# Patient Record
Sex: Female | Born: 1950 | Race: White | Hispanic: No | Smoking: Never smoker
Health system: Southern US, Community
[De-identification: ages and names within clinical notes are randomized; demographics above are authoritative.]

## PROBLEM LIST (undated history)

## (undated) DIAGNOSIS — F32A Depression, unspecified: Secondary | ICD-10-CM

## (undated) DIAGNOSIS — F329 Major depressive disorder, single episode, unspecified: Secondary | ICD-10-CM

## (undated) DIAGNOSIS — K209 Esophagitis, unspecified without bleeding: Secondary | ICD-10-CM

## (undated) HISTORY — PX: TUBAL LIGATION: SHX77

---

## 1971-12-06 HISTORY — PX: OVARIAN CYST REMOVAL: SHX89

## 2000-03-30 ENCOUNTER — Other Ambulatory Visit: Admission: RE | Admit: 2000-03-30 | Discharge: 2000-03-30 | Payer: Self-pay | Admitting: Obstetrics and Gynecology

## 2001-04-13 ENCOUNTER — Other Ambulatory Visit: Admission: RE | Admit: 2001-04-13 | Discharge: 2001-04-13 | Payer: Self-pay | Admitting: Obstetrics and Gynecology

## 2001-05-10 ENCOUNTER — Encounter: Payer: Self-pay | Admitting: Obstetrics and Gynecology

## 2001-05-10 ENCOUNTER — Encounter: Admission: RE | Admit: 2001-05-10 | Discharge: 2001-05-10 | Payer: Self-pay | Admitting: Obstetrics and Gynecology

## 2001-05-16 ENCOUNTER — Encounter: Admission: RE | Admit: 2001-05-16 | Discharge: 2001-05-16 | Payer: Self-pay | Admitting: Obstetrics and Gynecology

## 2001-05-16 ENCOUNTER — Encounter: Payer: Self-pay | Admitting: Obstetrics and Gynecology

## 2001-08-08 ENCOUNTER — Encounter: Payer: Self-pay | Admitting: Internal Medicine

## 2001-08-08 ENCOUNTER — Encounter: Admission: RE | Admit: 2001-08-08 | Discharge: 2001-08-08 | Payer: Self-pay | Admitting: Internal Medicine

## 2002-01-27 ENCOUNTER — Ambulatory Visit (HOSPITAL_COMMUNITY): Admission: RE | Admit: 2002-01-27 | Discharge: 2002-01-27 | Payer: Self-pay | Admitting: Orthopedic Surgery

## 2002-01-27 ENCOUNTER — Encounter: Payer: Self-pay | Admitting: Orthopedic Surgery

## 2002-04-04 ENCOUNTER — Ambulatory Visit (HOSPITAL_COMMUNITY): Admission: RE | Admit: 2002-04-04 | Discharge: 2002-04-04 | Payer: Self-pay | Admitting: Obstetrics and Gynecology

## 2002-04-04 ENCOUNTER — Encounter (INDEPENDENT_AMBULATORY_CARE_PROVIDER_SITE_OTHER): Payer: Self-pay | Admitting: Specialist

## 2002-05-16 ENCOUNTER — Encounter: Payer: Self-pay | Admitting: Obstetrics and Gynecology

## 2002-05-16 ENCOUNTER — Encounter: Admission: RE | Admit: 2002-05-16 | Discharge: 2002-05-16 | Payer: Self-pay | Admitting: Obstetrics and Gynecology

## 2003-01-09 ENCOUNTER — Other Ambulatory Visit: Admission: RE | Admit: 2003-01-09 | Discharge: 2003-01-09 | Payer: Self-pay | Admitting: Internal Medicine

## 2003-06-13 ENCOUNTER — Encounter: Admission: RE | Admit: 2003-06-13 | Discharge: 2003-06-13 | Payer: Self-pay | Admitting: Obstetrics and Gynecology

## 2003-06-13 ENCOUNTER — Encounter: Payer: Self-pay | Admitting: Obstetrics and Gynecology

## 2004-11-15 ENCOUNTER — Encounter: Admission: RE | Admit: 2004-11-15 | Discharge: 2004-11-15 | Payer: Self-pay | Admitting: Internal Medicine

## 2005-07-15 IMAGING — CR DG CHEST 2V
1 series · 2 of 2 positions shown · non-contrast
Comparison: none

REASON FOR EXAM: Left lateral chest pain
DR.TOTONG [REDACTED]
COMMENTS:

PROCEDURE:     DXR - DXR CHEST PA (OR AP) AND LATERAL  - [DATE]  [DATE]
RESULT:     Views of the chest show the lungs appear to be clear and fully
inflated. The heart and pulmonary vessels are normal. The bony structures
are unremarkable.

[Series 4346: postero_anterior · 0.22mm/px · 2 of 2 slices shown]
[im 1/2]
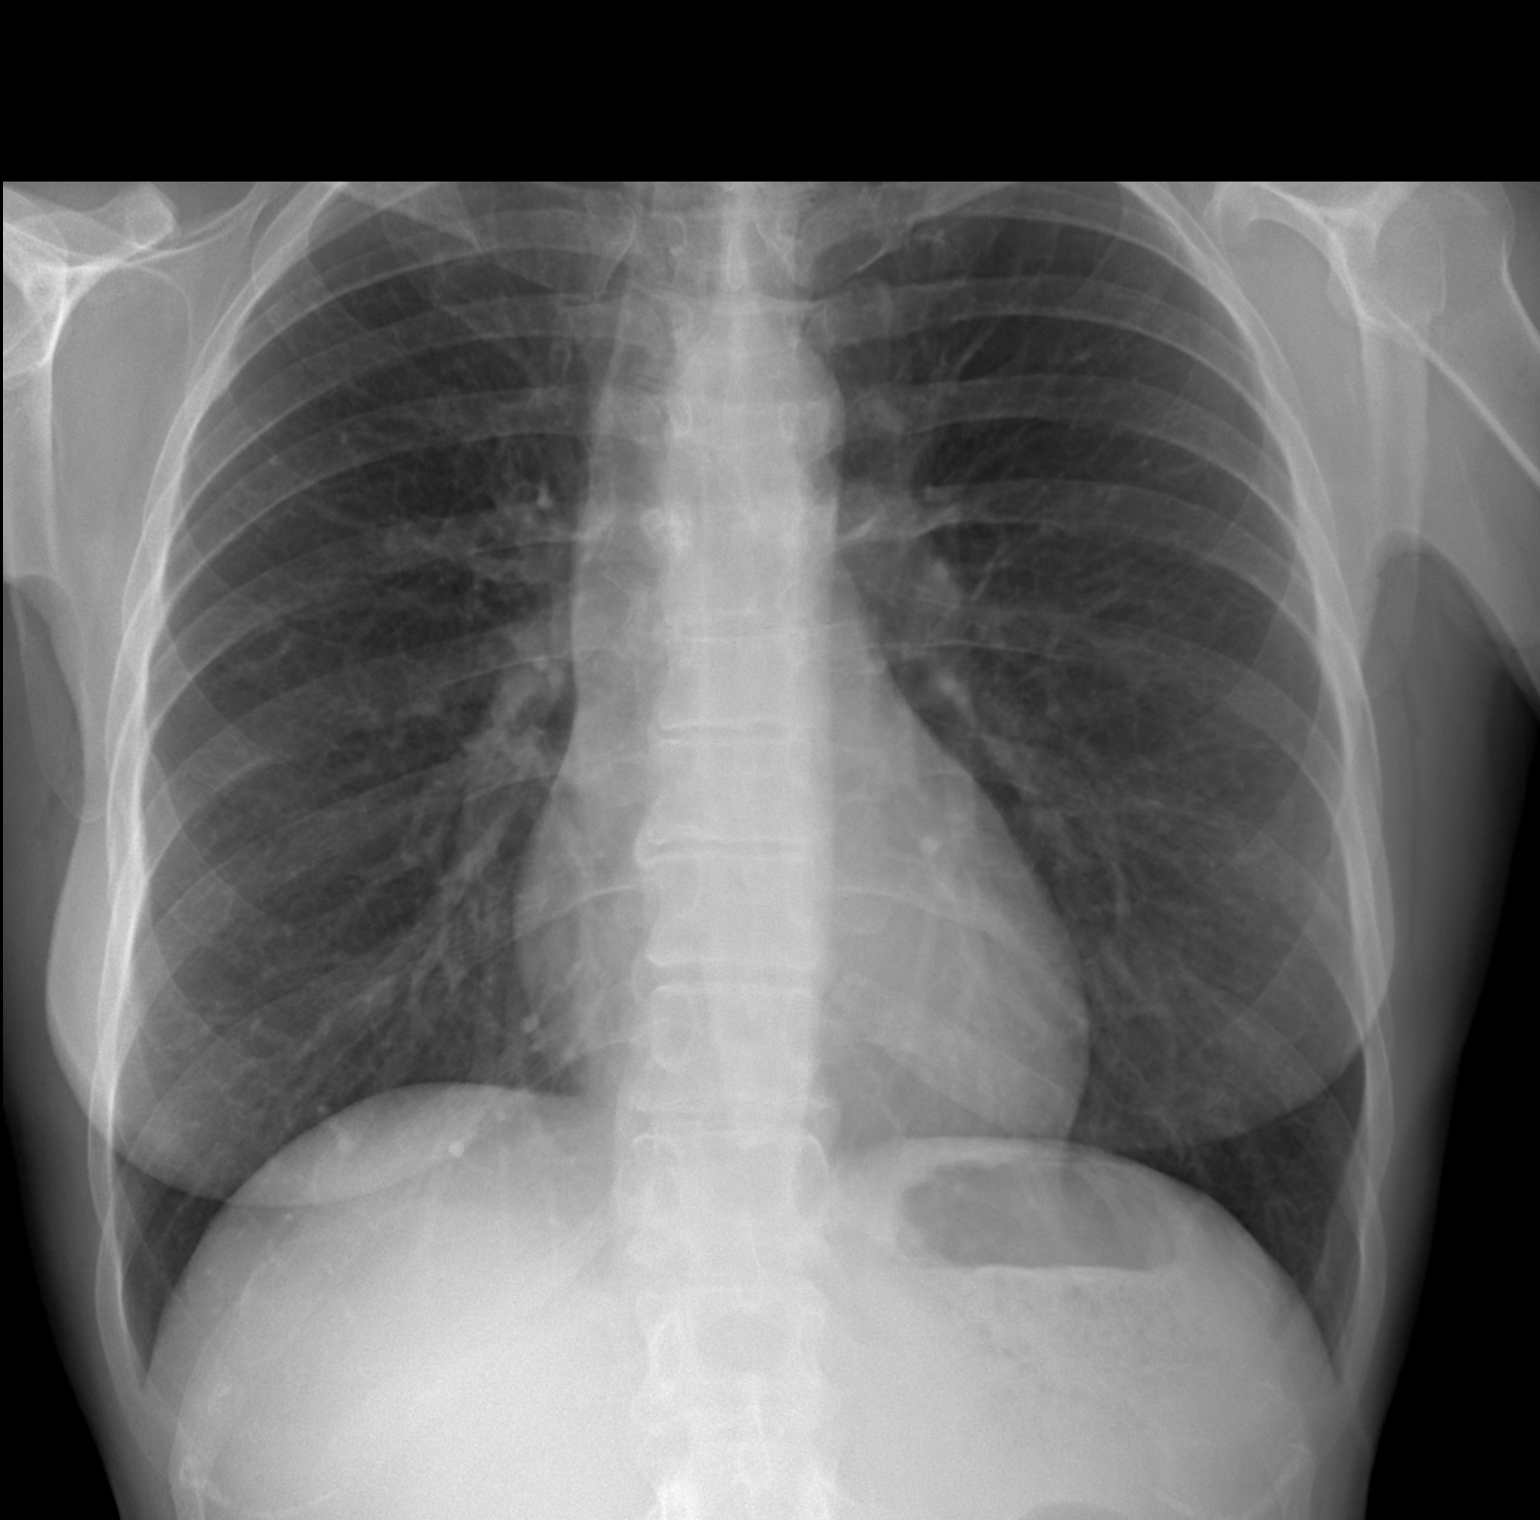
[im 2/2]
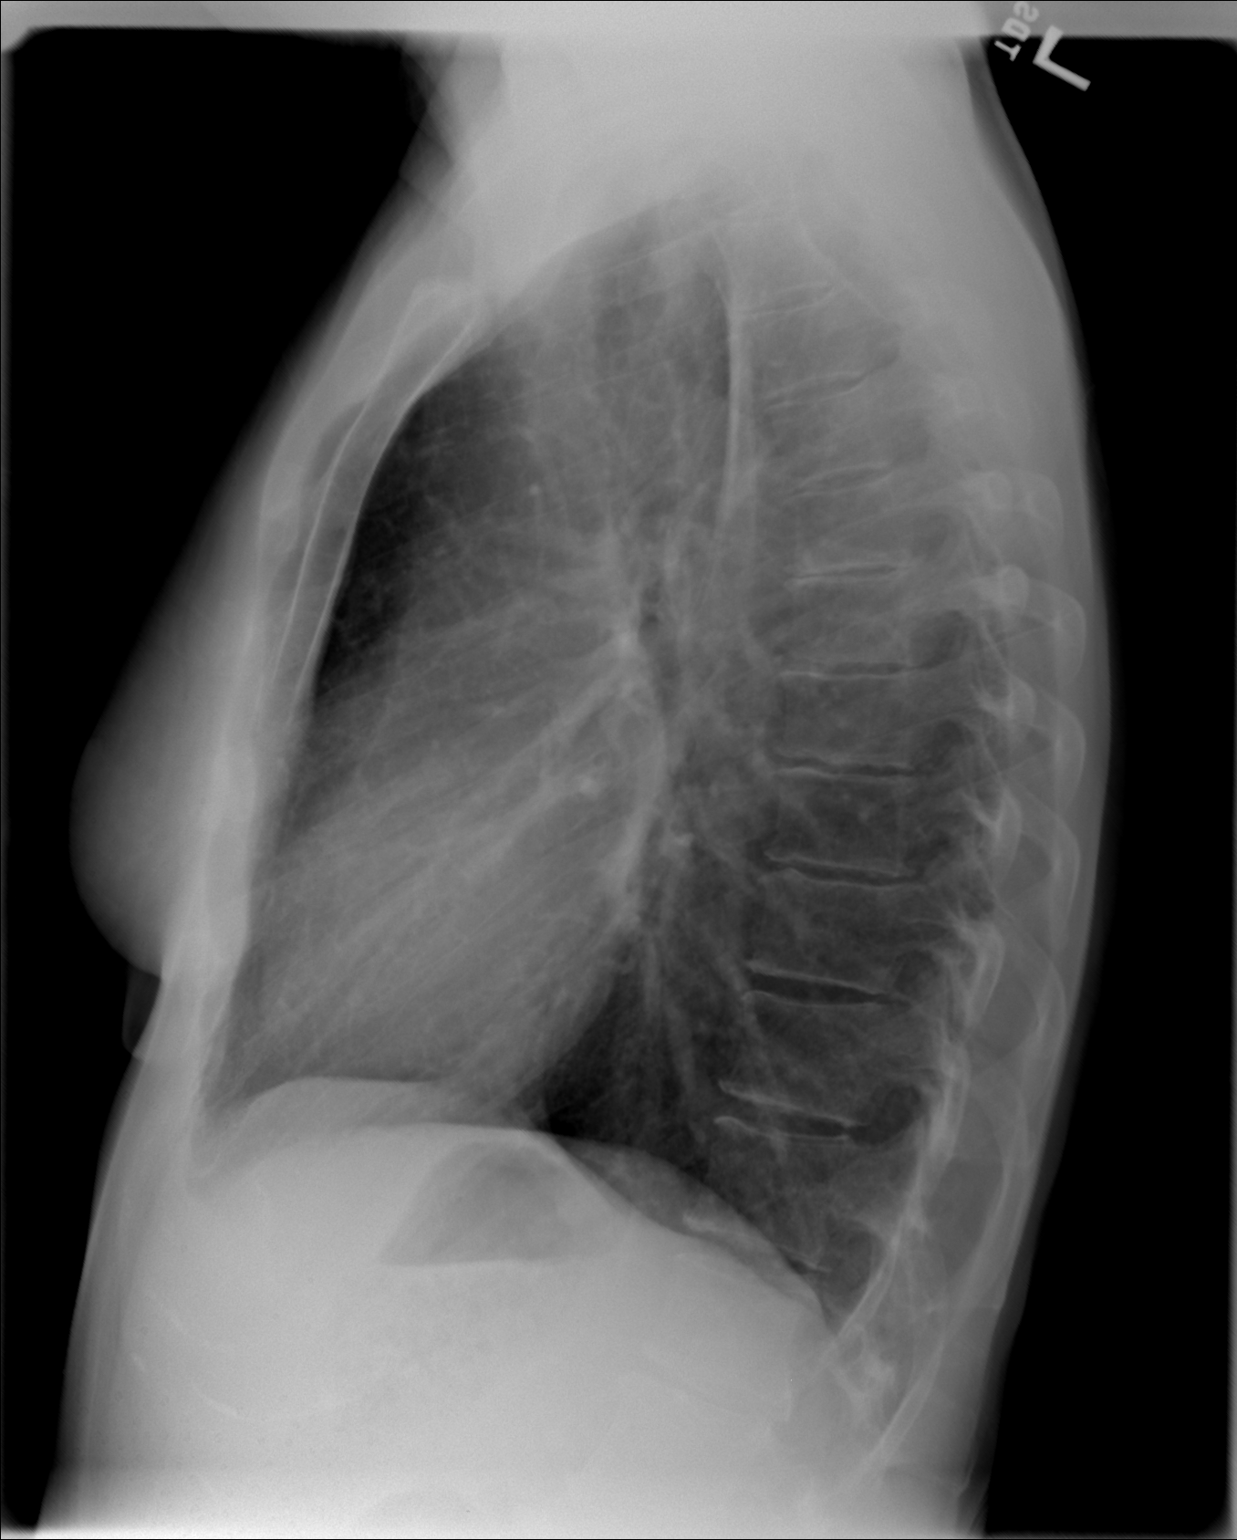

[2 of 2 positions shown; findings below may reference images not displayed]

IMPRESSION: No acute cardiopulmonary disease.

## 2006-02-16 ENCOUNTER — Ambulatory Visit: Payer: Self-pay

## 2006-02-16 IMAGING — CR DG RIBS 2V*L*
1 series · 3 of 3 positions shown · non-contrast
Comparison: none

REASON FOR EXAM: LEFT LATERAL CHEST PAIN(DR.JIM [REDACTED])
COMMENTS:

PROCEDURE:     DXR - DXR RIBS LEFT UNILATERAL  - [DATE]  [DATE]
RESULT:     Multiple views of the LEFT ribs show no definite fracture.

[Series 4347: antero_posterior · 0.22mm/px · 3 of 3 slices shown]
[im 1/3]
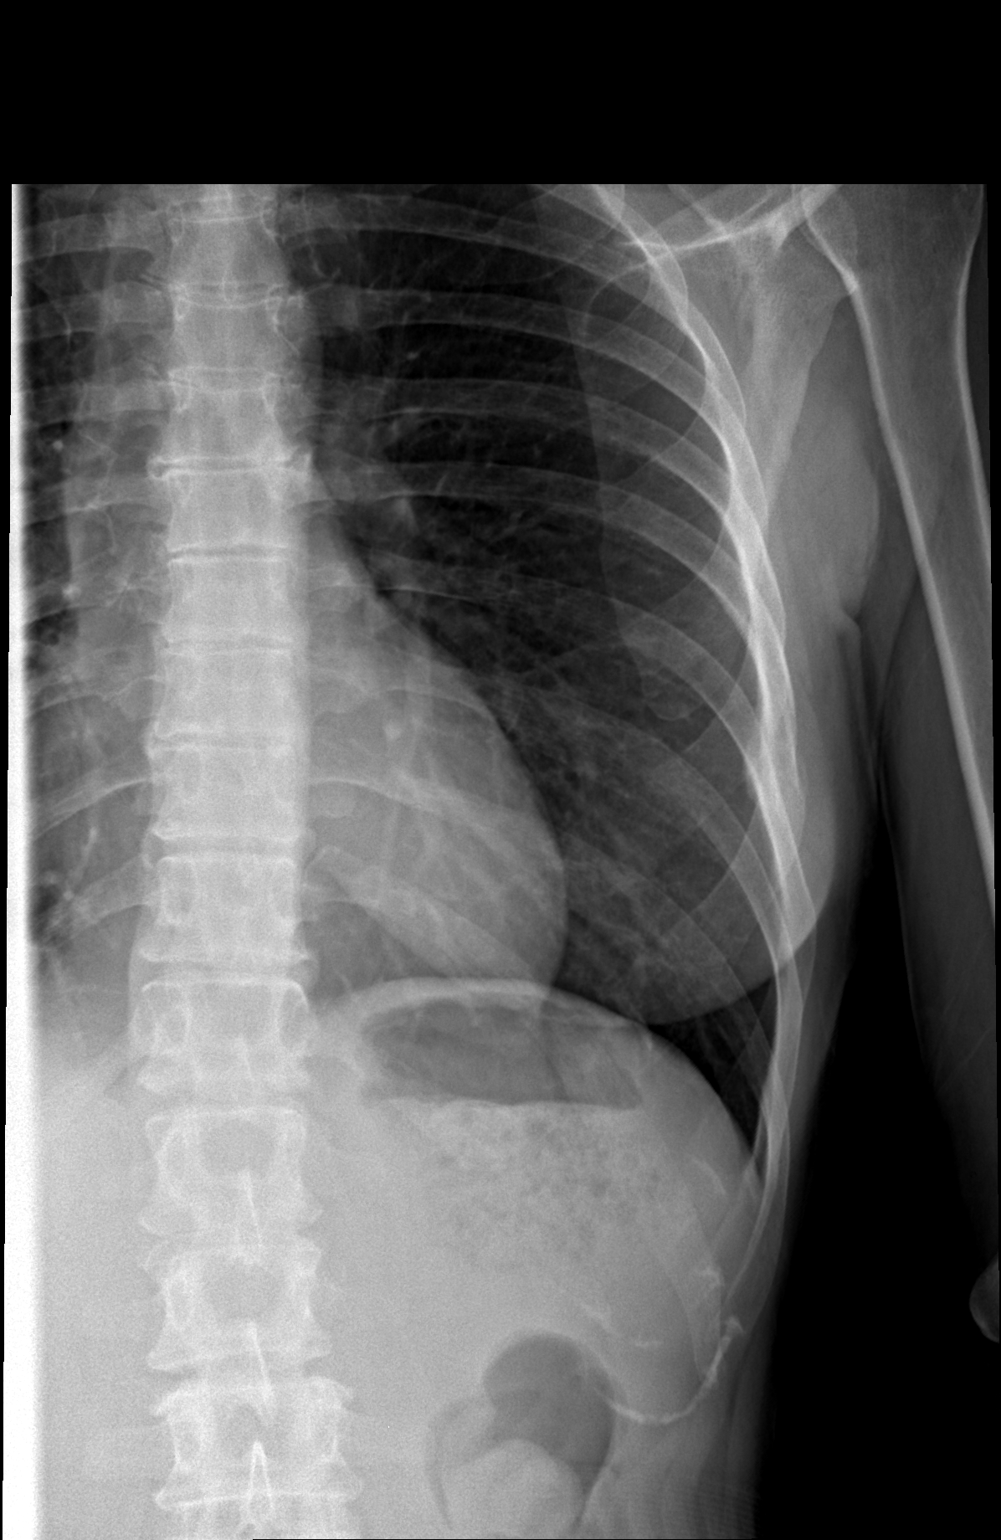
[im 2/3]
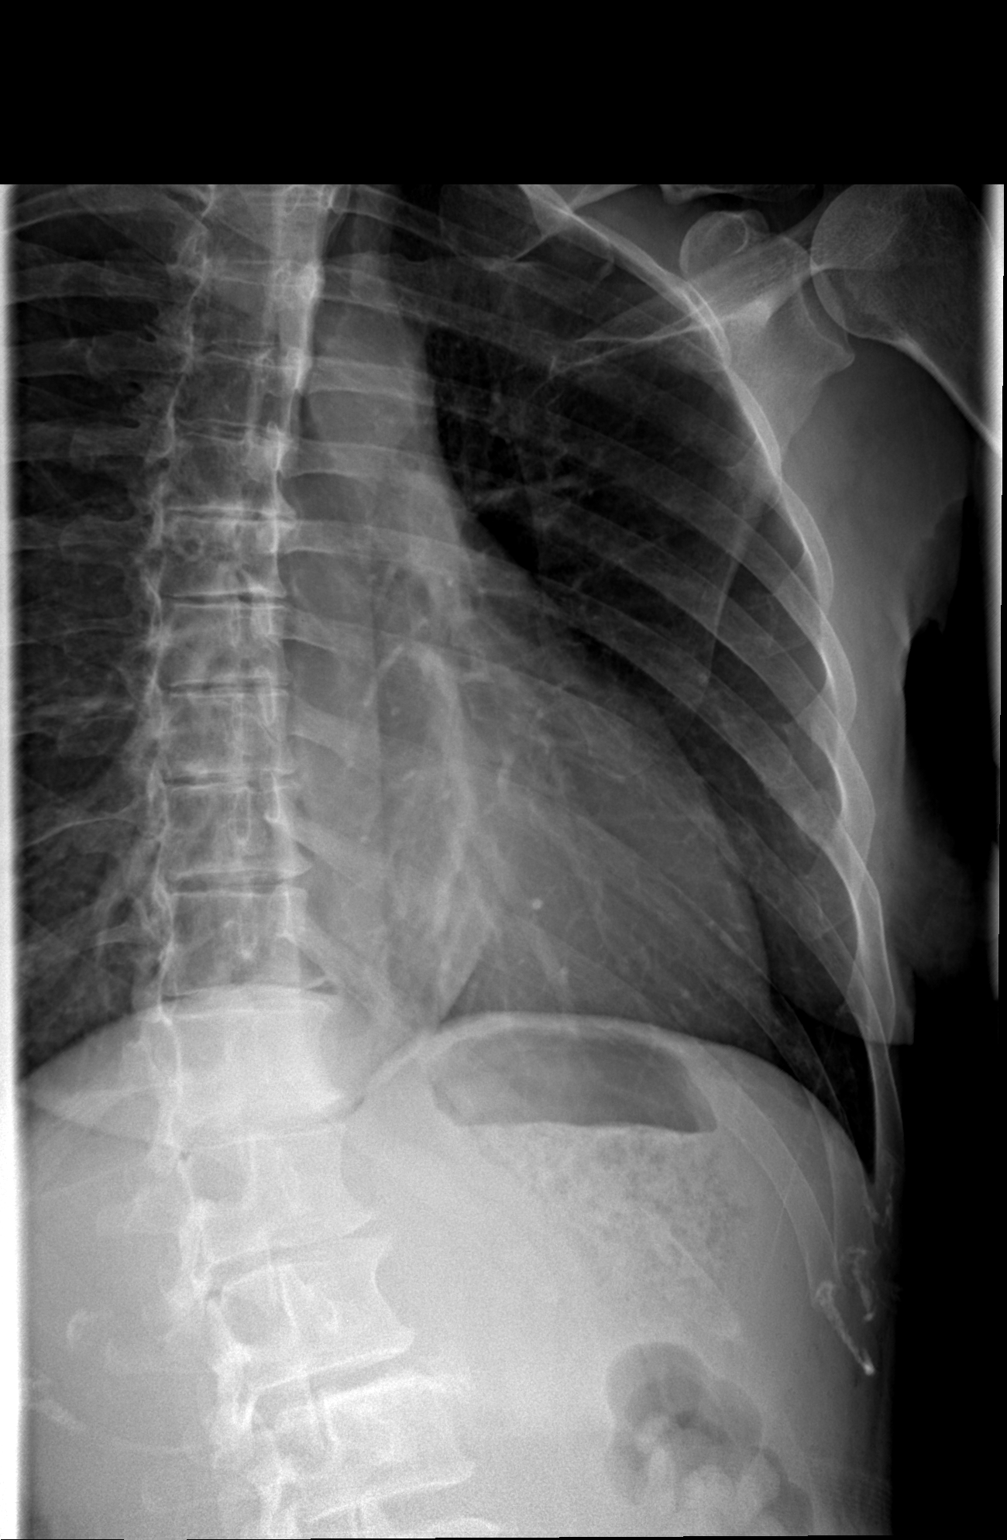
[im 3/3]
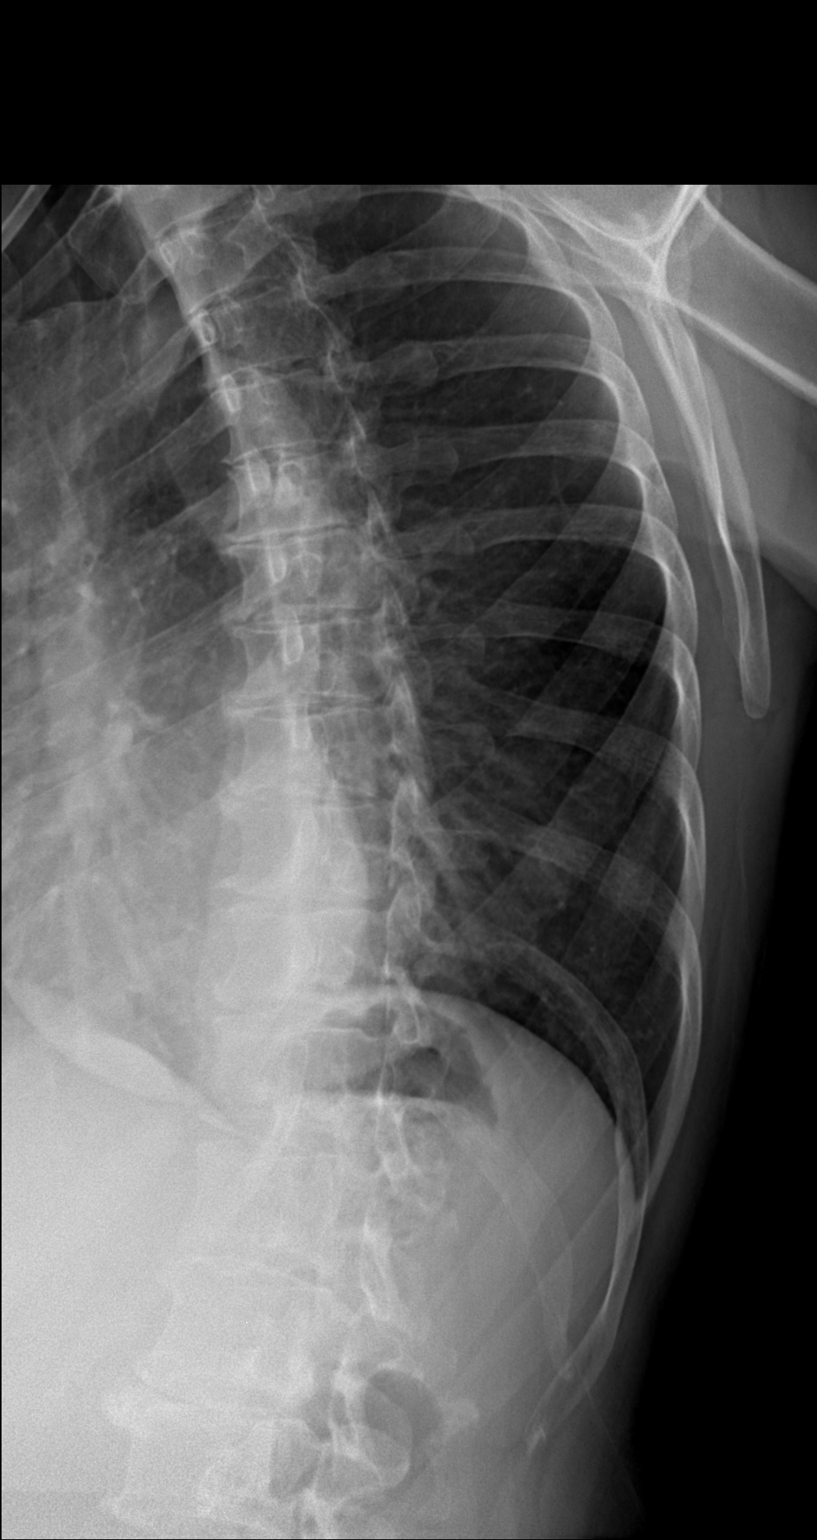

[3 of 3 positions shown; findings below may reference images not displayed]

IMPRESSION: Please see above.

## 2006-06-23 ENCOUNTER — Encounter: Admission: RE | Admit: 2006-06-23 | Discharge: 2006-06-23 | Payer: Self-pay | Admitting: Internal Medicine

## 2006-06-23 IMAGING — MG MM SCREEN MAMMOGRAM BILATERAL
5 series · 5 of 5 positions shown · non-contrast
Comparison: none

DG SCREEN MAMMOGRAM BILATERAL
Bilateral CC and MLO view(s) were taken.
Prior study comparison: [DATE], bilateral screening mammogram.

SCREENING MAMMOGRAM:
The breast tissue is heterogeneously dense.  There is no dominant mass, architectural distortion or
calcification to suggest malignancy.

[R CC]
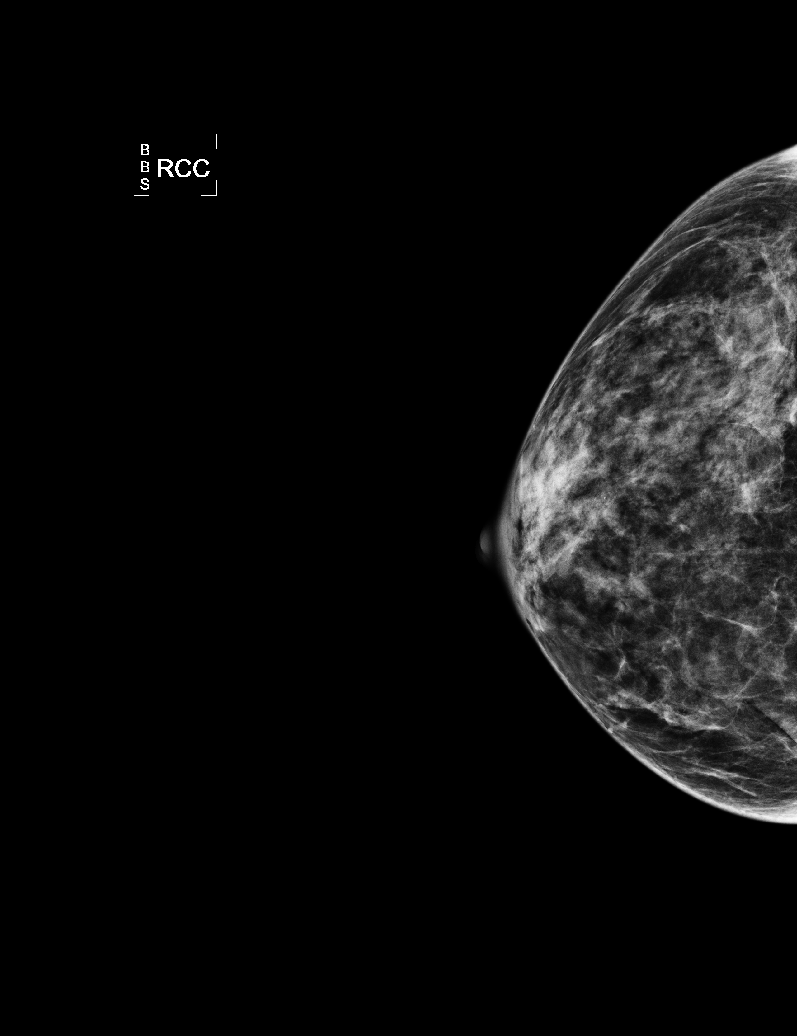

[L CC]
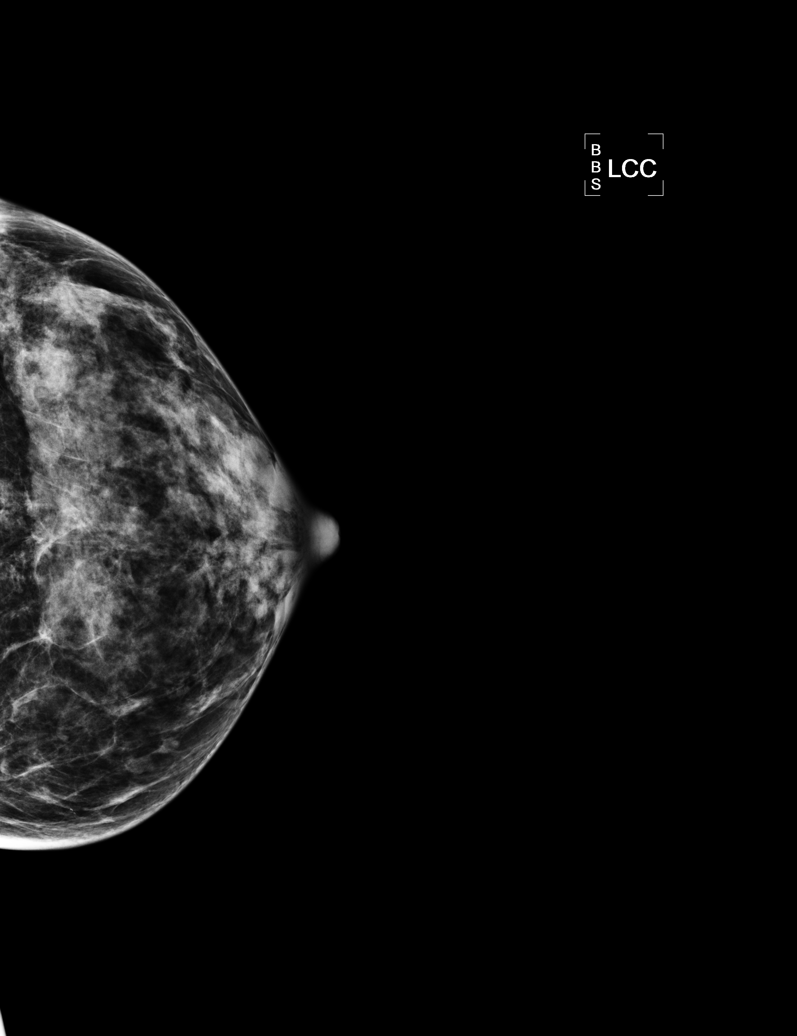

[L MLO (1 of 2)]
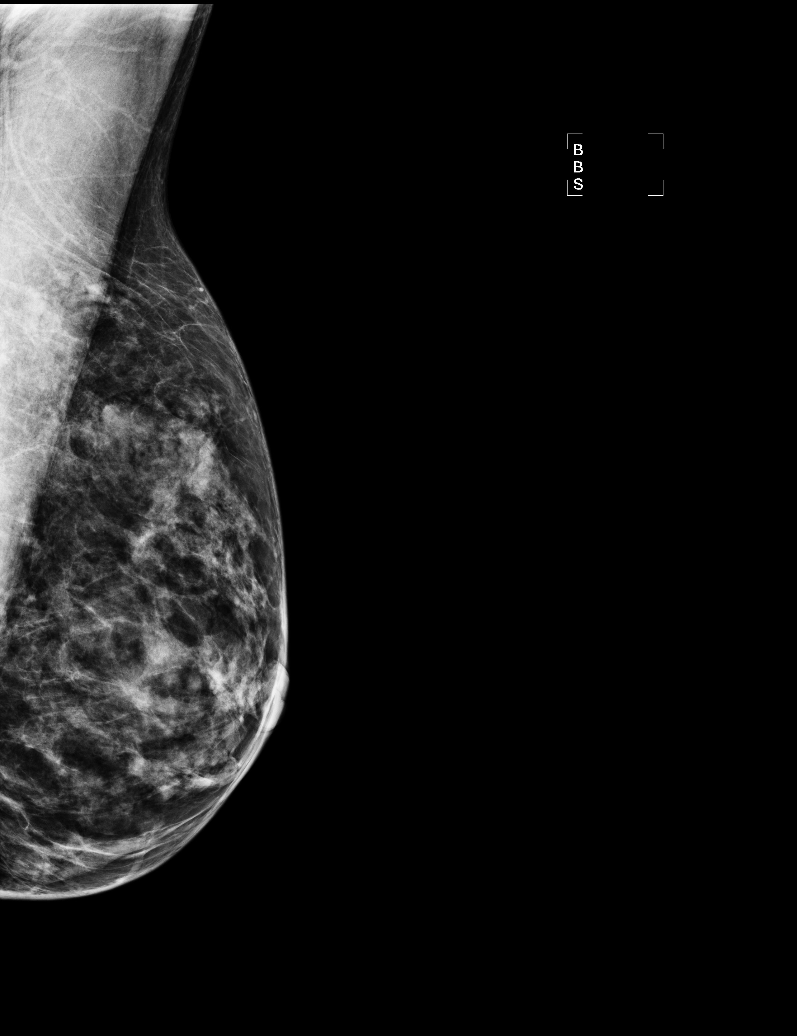

[R MLO]
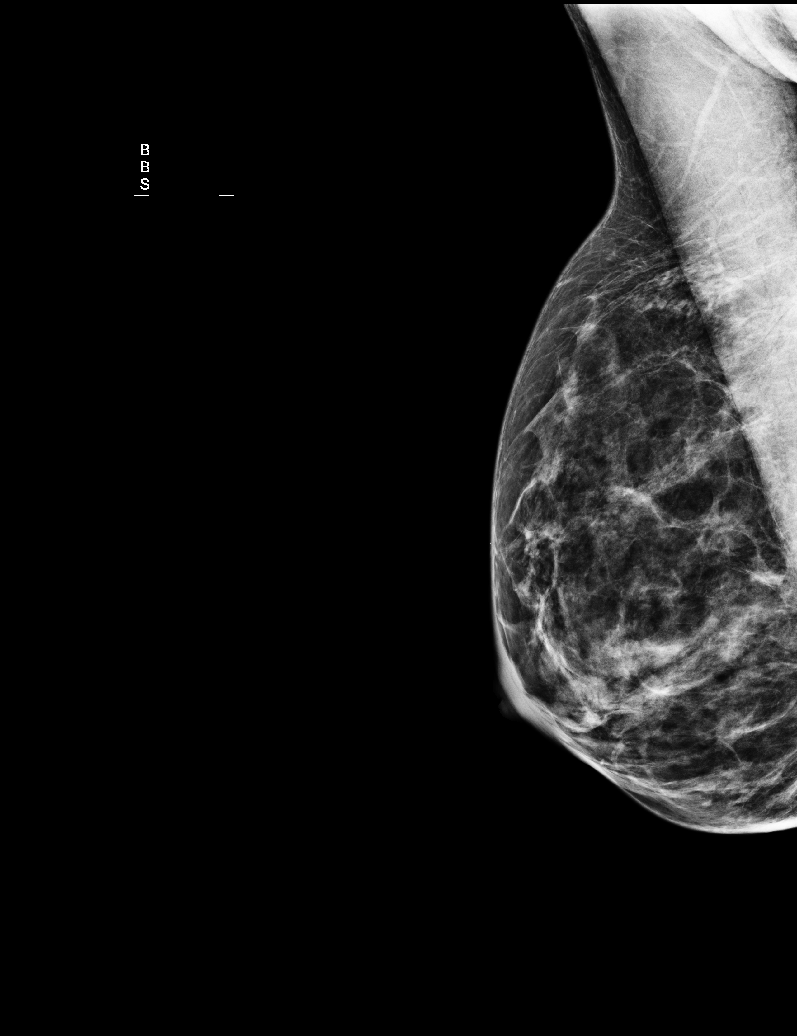

[L MLO (2 of 2)]
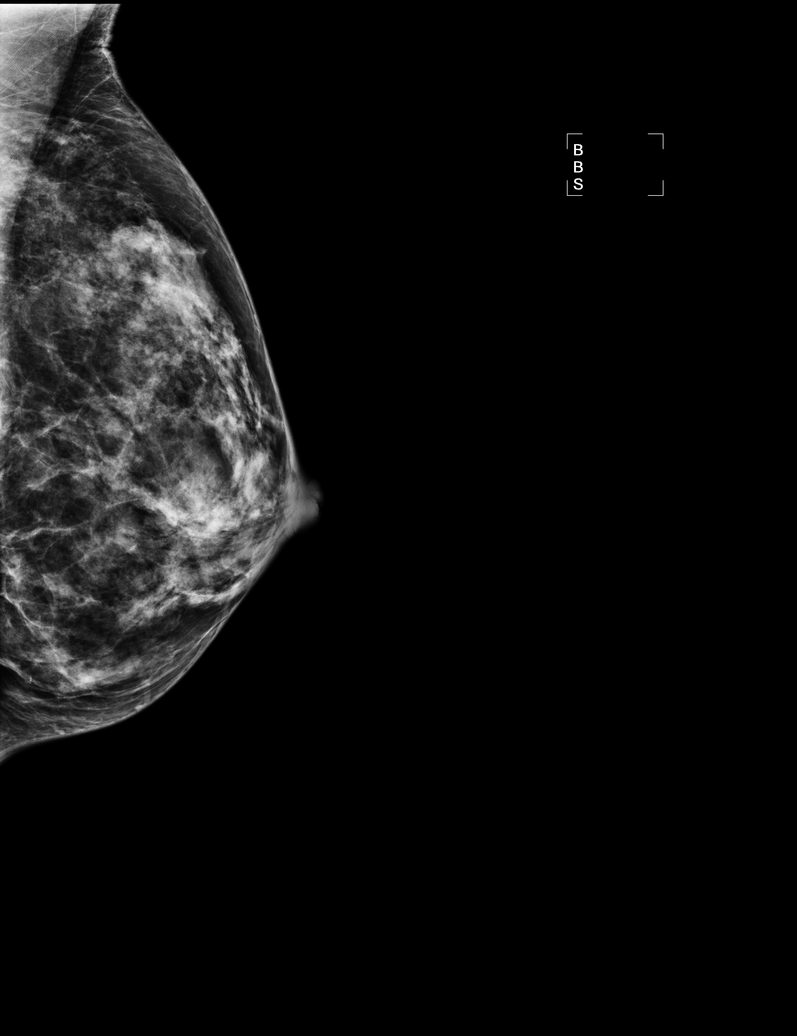

[5 of 5 positions shown; findings below may reference images not displayed]

IMPRESSION: No mammographic evidence of malignancy.  Suggest yearly screening mammography.  Consider screening 
MRI every 1-2 years, given the patient's high risk for breast cancer.

ASSESSMENT: Negative - BI-RADS 1

Screening mammogram in 1 year.
ANALYZED BY COMPUTER AIDED DETECTION. , THIS PROCEDURE WAS A DIGITAL MAMMOGRAM.

## 2006-07-07 ENCOUNTER — Other Ambulatory Visit: Admission: RE | Admit: 2006-07-07 | Discharge: 2006-07-07 | Payer: Self-pay | Admitting: Internal Medicine

## 2006-11-23 ENCOUNTER — Encounter: Admission: RE | Admit: 2006-11-23 | Discharge: 2006-11-23 | Payer: Self-pay | Admitting: Internal Medicine

## 2007-02-01 ENCOUNTER — Ambulatory Visit: Payer: Self-pay | Admitting: Unknown Physician Specialty

## 2007-02-01 IMAGING — RF DG BARIUM SWALLOW
1 series · 15 of 20 positions shown · non-contrast
Comparison: none

REASON FOR EXAM: Questionable Stricture Dysphagia With Tablet
COMMENTS:

[Series 1: run · 14 acquisitions, 15 frames shown]
[im 1/14]
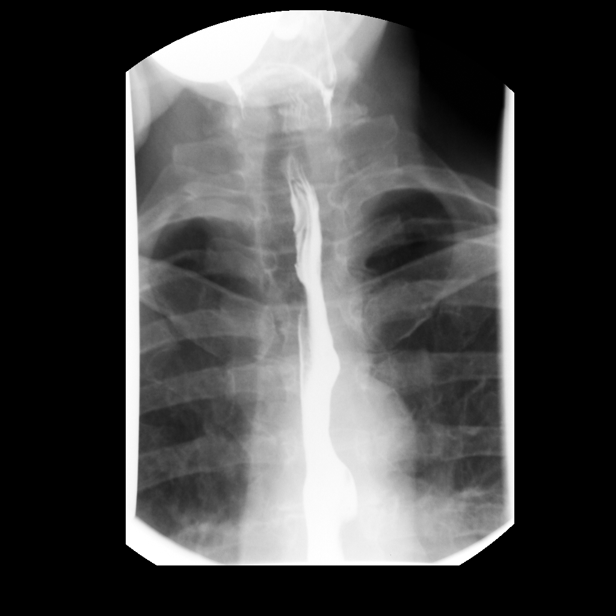
[im 3/14]
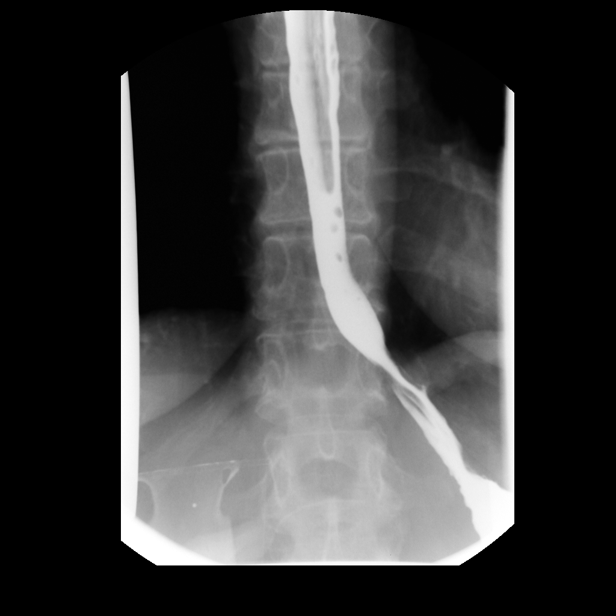
[im 4/14]
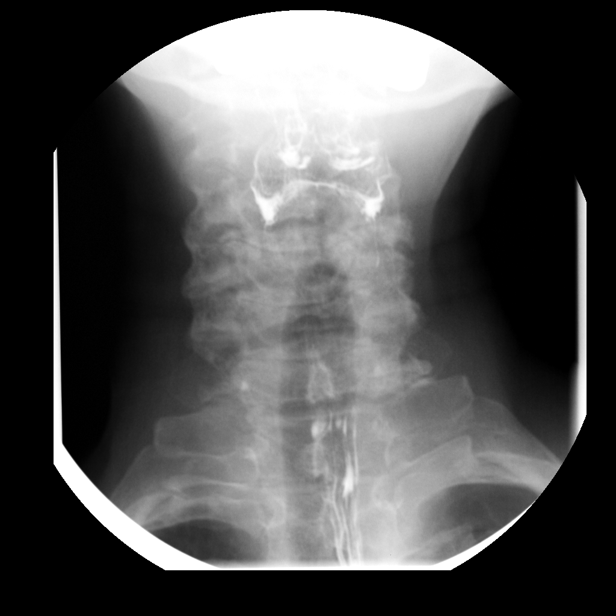
[im 4/14]
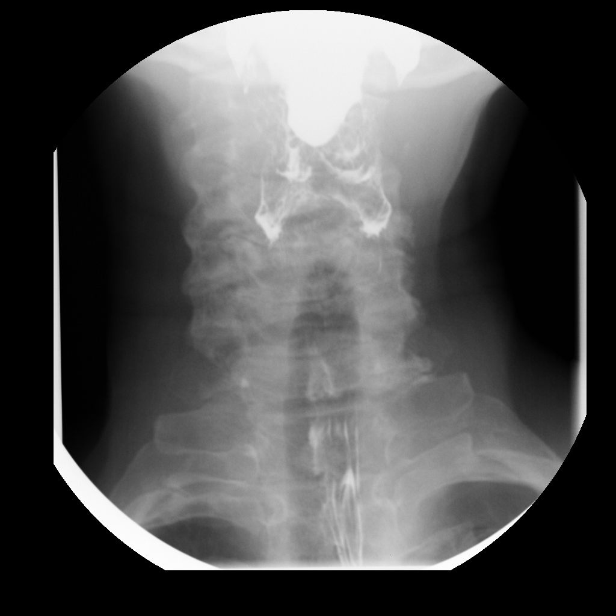
[im 4/14]
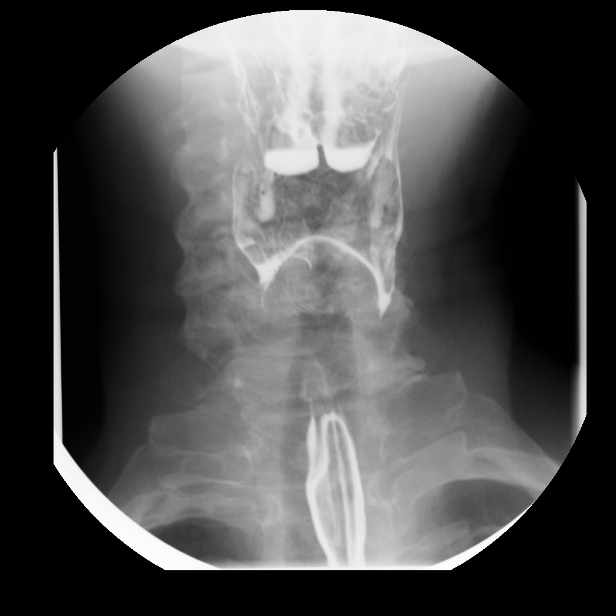
[im 5/14]
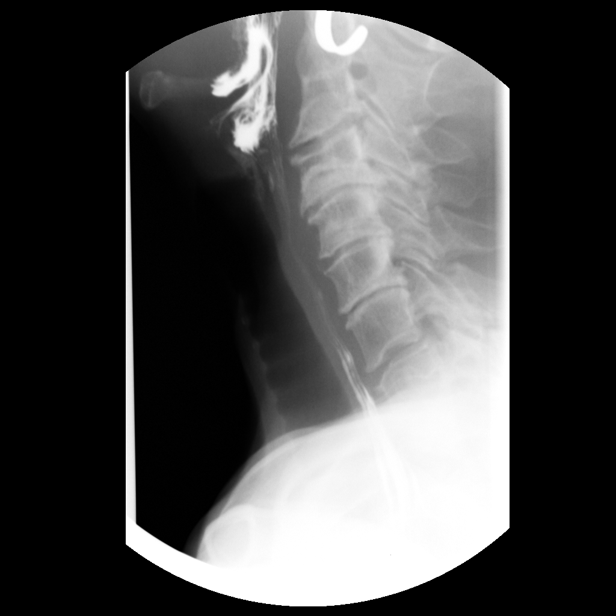
[im 5/14]
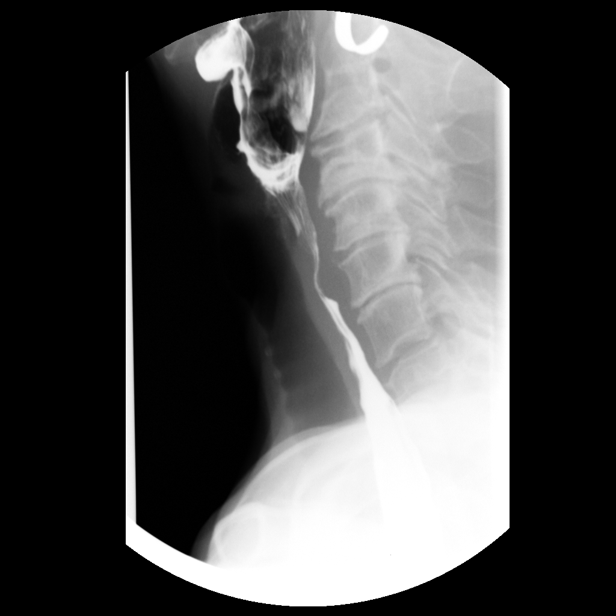
[im 5/14]
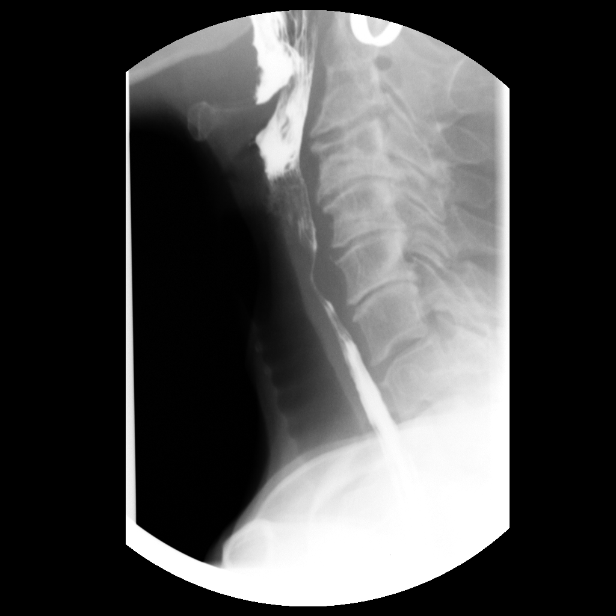
[im 6/14]
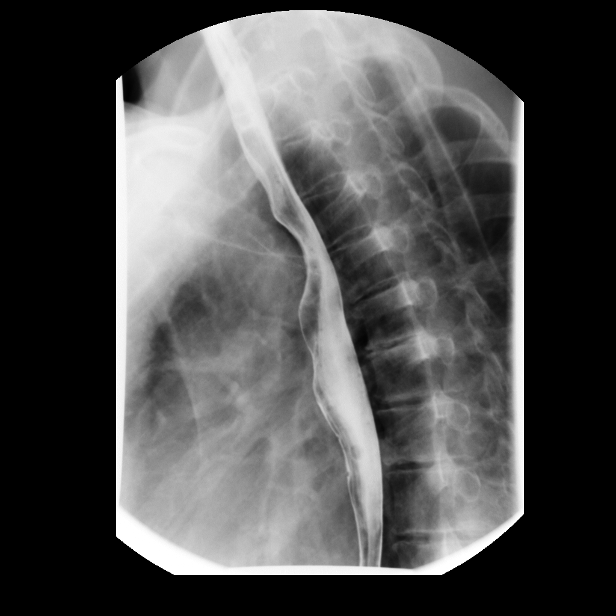
[im 7/14]
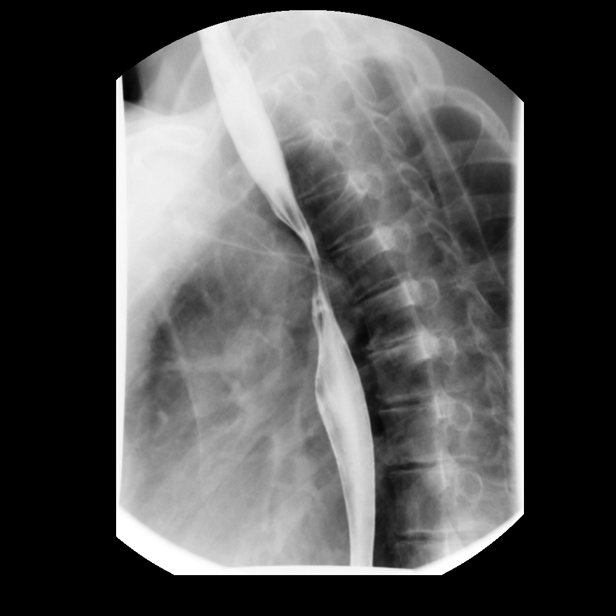
[im 9/14]
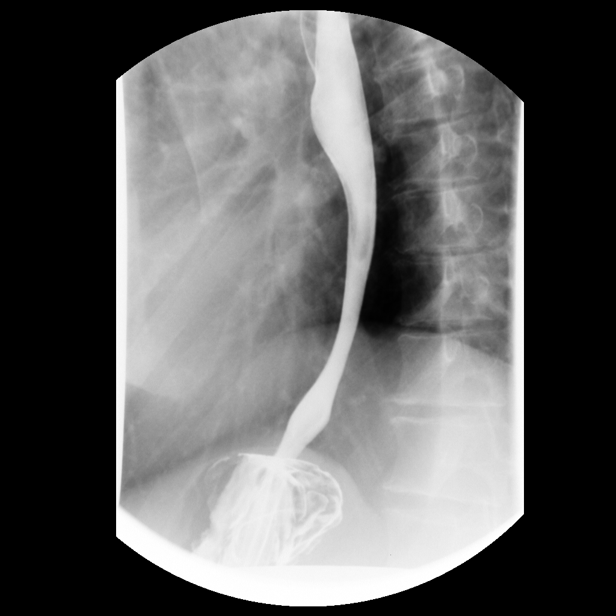
[im 10/14]
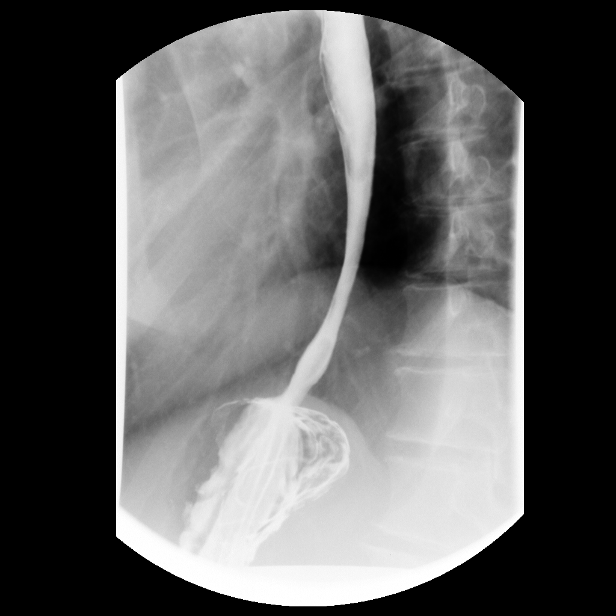
[im 11/14]
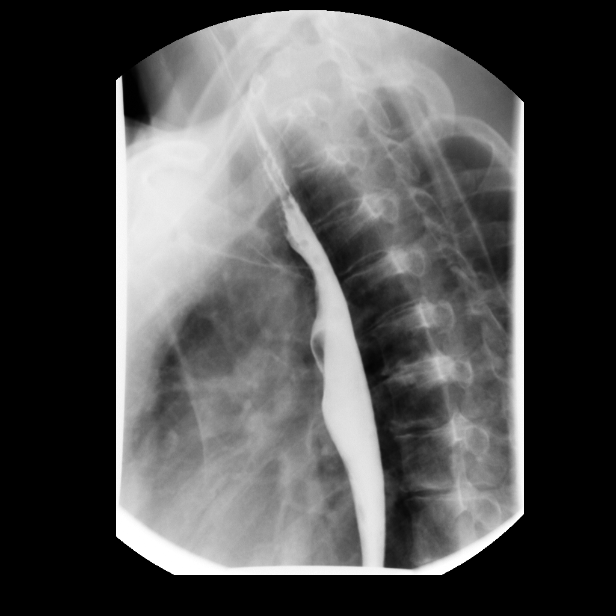
[im 13/14]
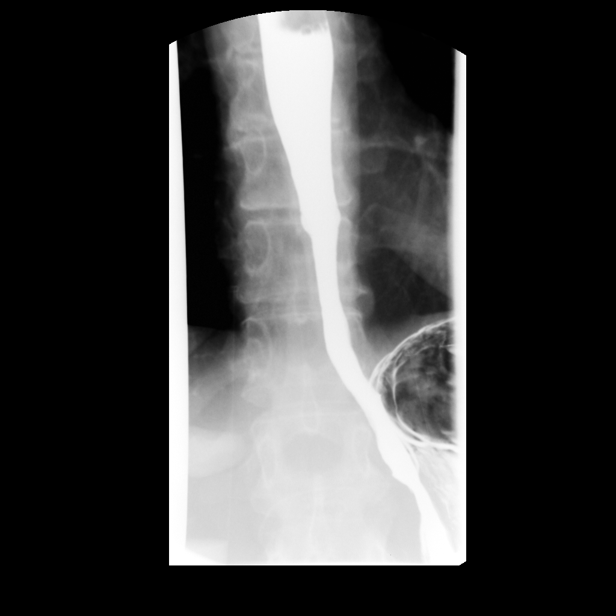
[im 14/14]
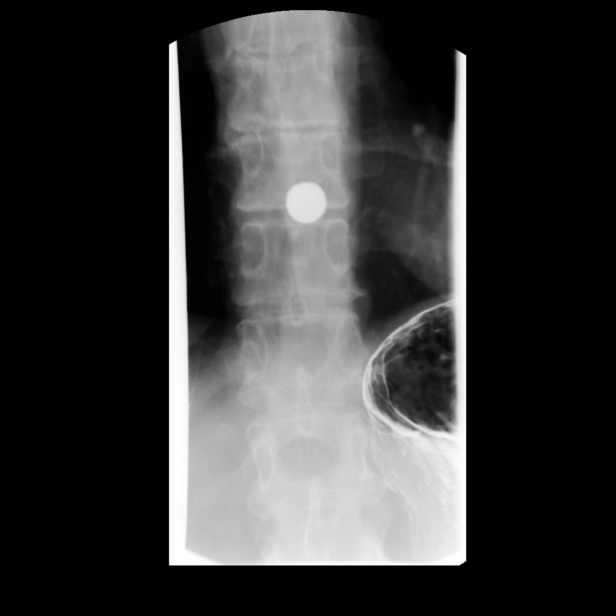

[15 of 20 positions shown; findings below may reference images not displayed]

PROCEDURE:     FL  - FL BARIUM SWALLOW  - [DATE]  [DATE]

RESULT:       The cervical esophagus was first examined.  Barium passed
through the esophagus unobstructed with no areas of narrowing identified.
The remainder of the esophagus appears within normal limits.  No hiatal
hernia and no gastroesophageal reflux is elicited.  The 12.5 mm barium
impregnated tablet passed into the distal esophagus but would not pass into
the stomach.  There may be some distal narrowing due to stenosis from reflux
esophagitis.  No reflux was elicited on this exam.  The possibility of
carcinoma may be considered although not typical for carcinoma.  It may be
worthwhile to obtain a direct visualization with possibly biopsy of the
distal esophagus.
IMPRESSION: Apparent narrowing of the distal esophagus with the 12.5 mm barium
impregnated tablet unable to traverse the distal esophagus into the stomach.
 Further evaluation with direct visualization and possibly biopsy would be
recommended.

## 2007-06-26 ENCOUNTER — Ambulatory Visit: Payer: Self-pay | Admitting: Obstetrics and Gynecology

## 2007-06-26 IMAGING — MG MAM DGTL SCREENING MAMMO W/CAD
1 series · 4 of 4 positions shown · non-contrast
Comparison: none

REASON FOR EXAM: screening mammo
COMMENTS:

[Series 4707: R CC · right · 4 of 4 slices shown]
[im 1/4]
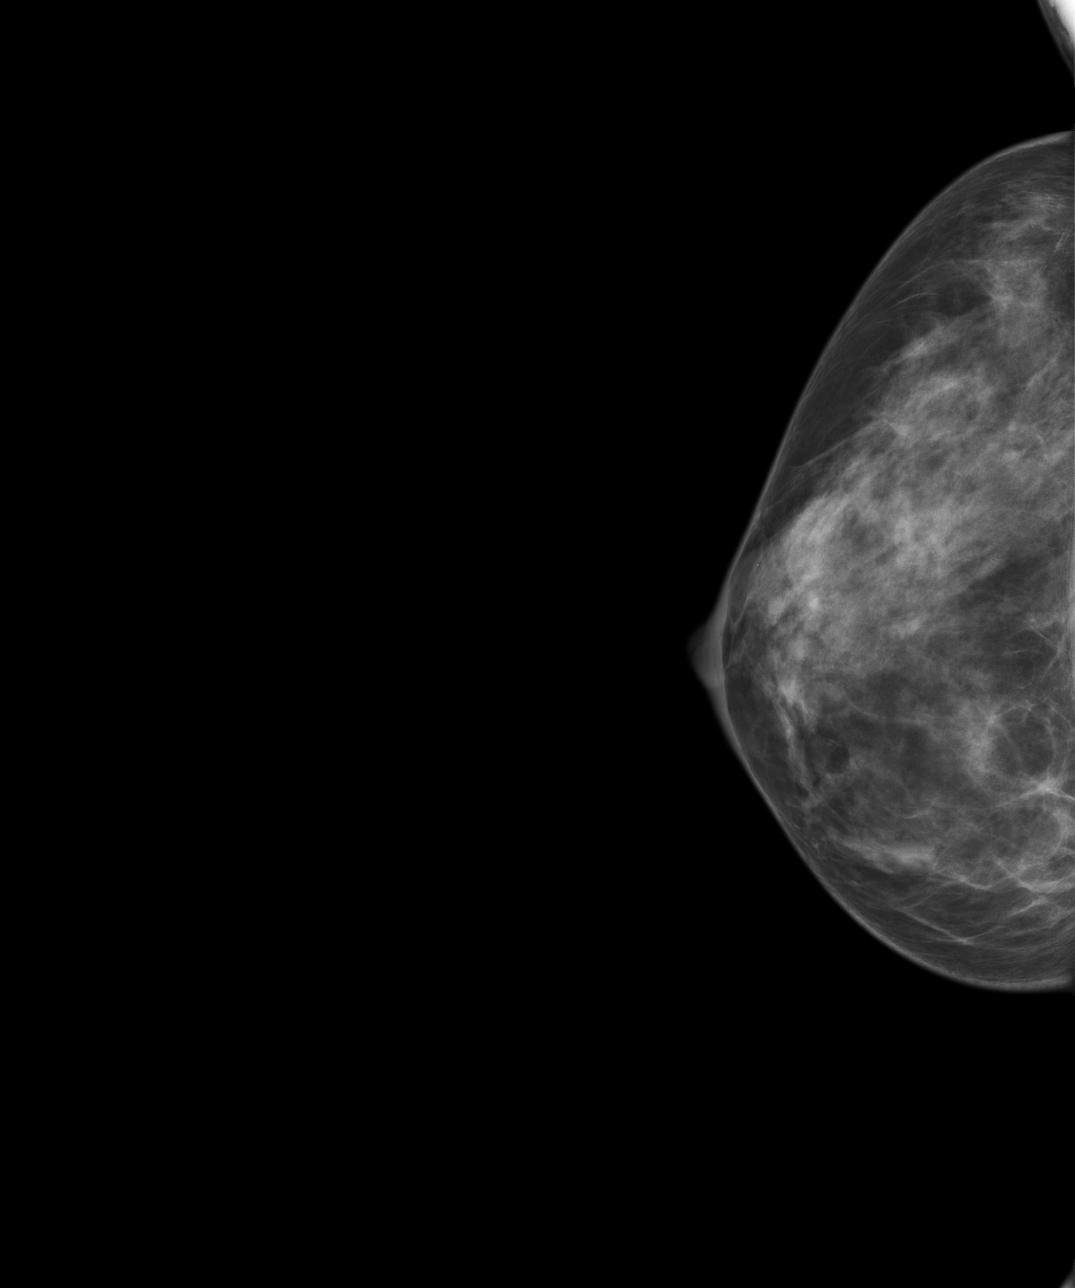
[im 2/4]
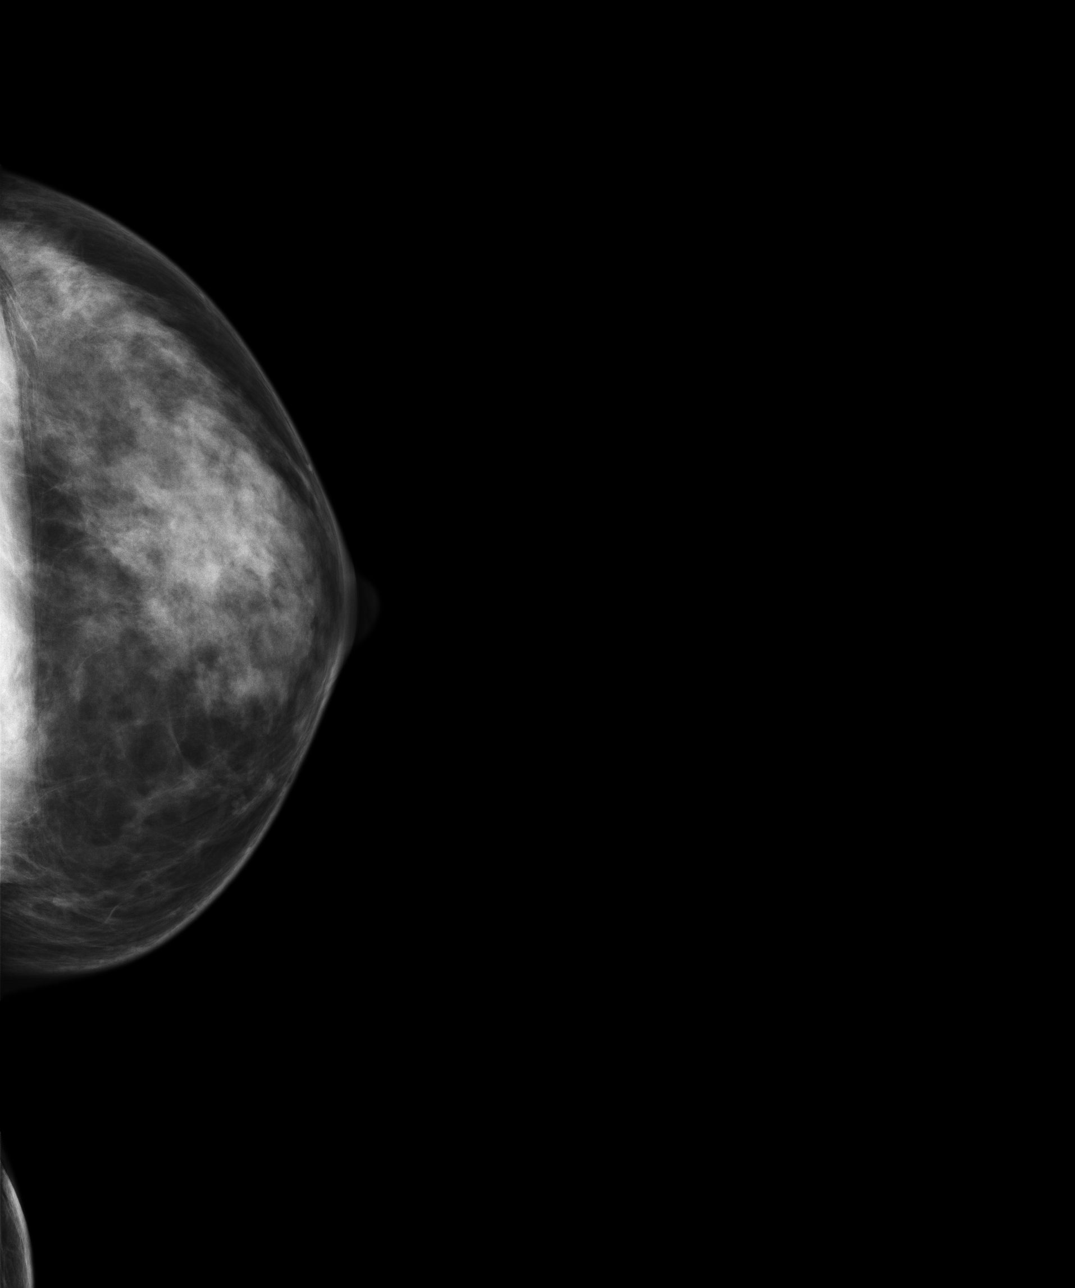
[im 3/4]
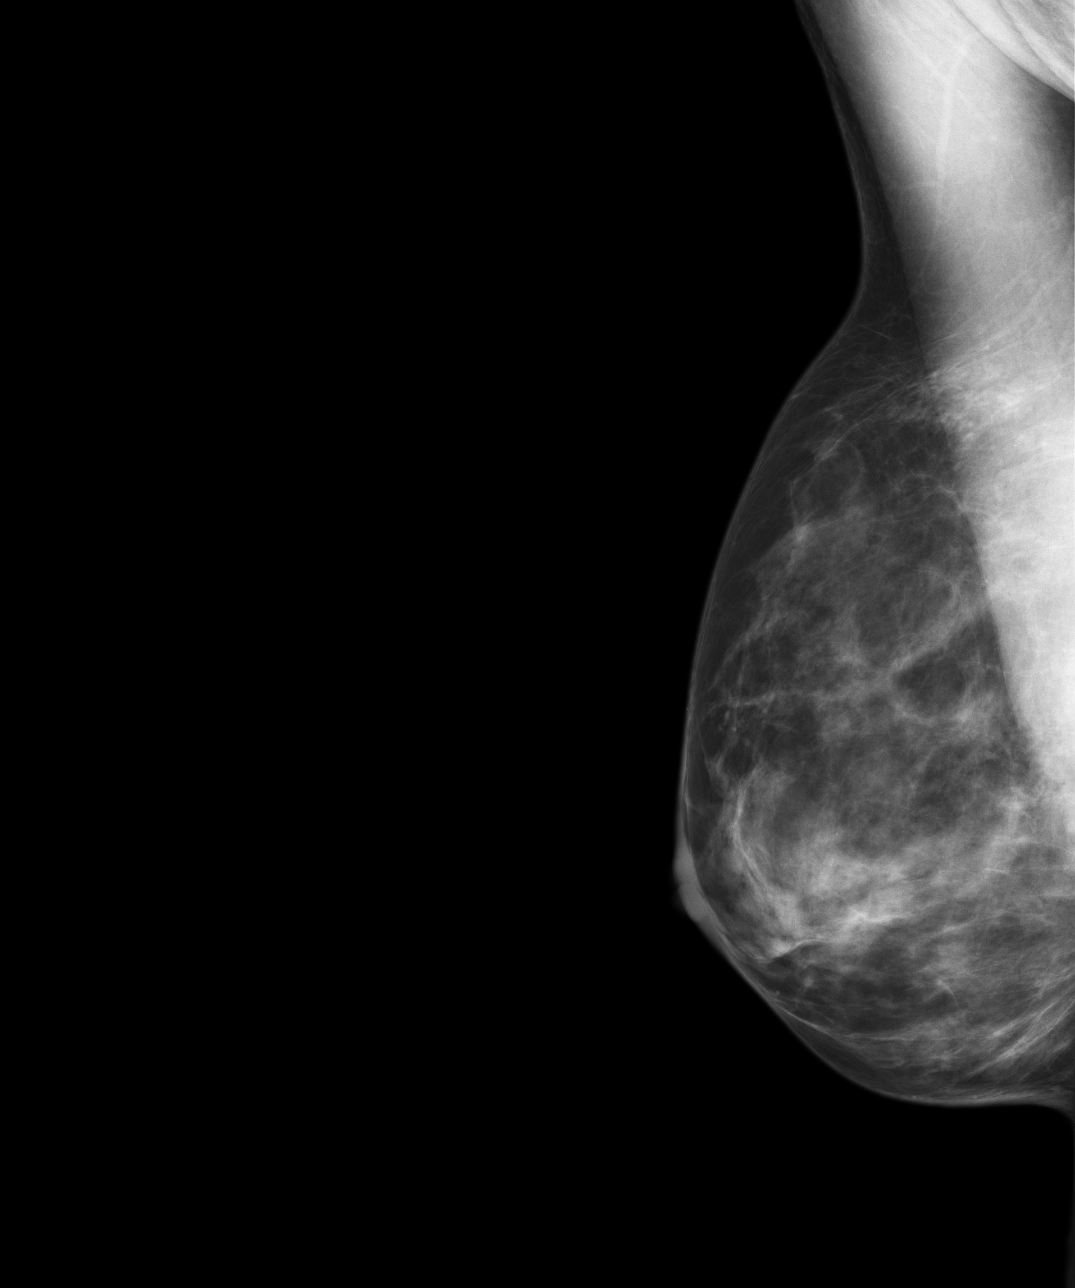
[im 4/4]
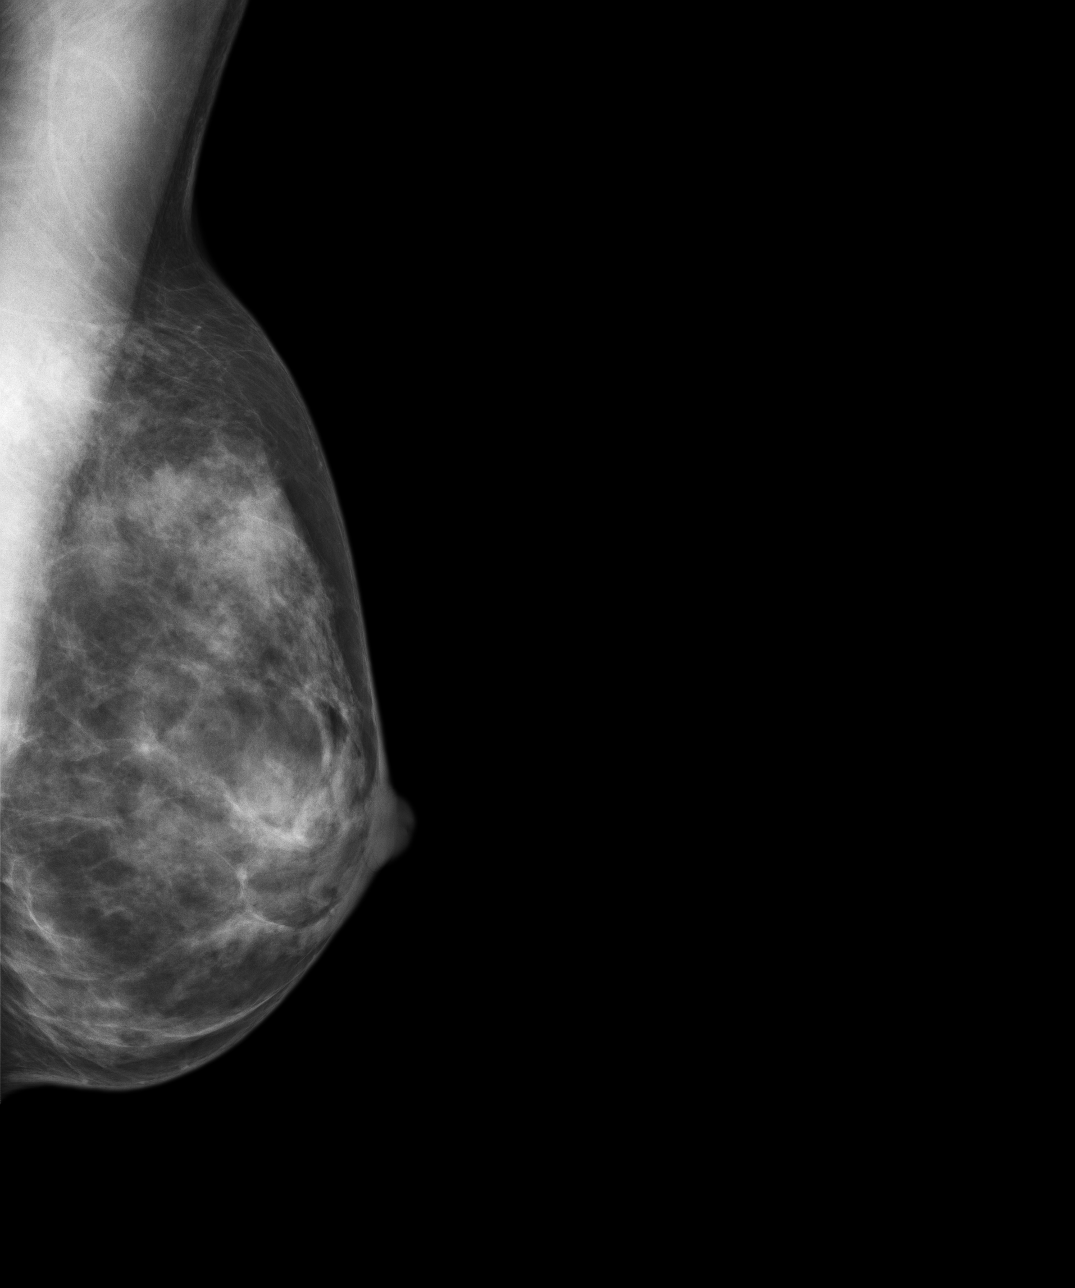

[4 of 4 positions shown; findings below may reference images not displayed]

PROCEDURE:     MAM - MAM DGTL SCREENING MAMMO W/CAD  - [DATE] [DATE]

RESULT:       Comparison is made to a prior digital study from a facility in
[HOSPITAL] dated [DATE] and [DATE]. The latter study was a
film screen exam while the former study is a printed version of a digital
exam.

The breasts exhibit a moderately dense nodular parenchymal pattern.  There
is no dominant mass.  There are no malignant appearing groupings of
microcalcification.
IMPRESSION: 1.     I do not see findings suspicious for malignancy.
2.     BI-RADS:  Category 2-Benign Finding.

RECOMMENDATION:  Please continue to encourage yearly mammographic followup.

A NEGATIVE MAMMOGRAM REPORT DOES NOT PRECLUDE BIOPSY OR OTHER EVALUATION OF
A CLINICALLY PALPABLE OR OTHERWISE SUPSICIOUS MASS OR LESION.  BREAST CANCER
MAY NOT BE DETECTED BY MAMMOGRAPHY IN UP TO 10% OF CASES.

## 2008-07-02 ENCOUNTER — Ambulatory Visit: Payer: Self-pay | Admitting: Obstetrics and Gynecology

## 2008-07-02 IMAGING — MG MAM DGTL SCREENING MAMMO W/CAD
1 series · 4 of 4 positions shown · non-contrast
Comparison: [DATE] [HOSPITAL] [REDACTED], [DATE].

REASON FOR EXAM: scr mammo
COMMENTS:

PROCEDURE:     MAM - MAM DGTL SCREENING MAMMO W/CAD  - [DATE]  [DATE]
RESULT:      56-year-old female presents for screening mammogram.

[R CC · right · 4 of 4 slices shown]
[im 1/4]
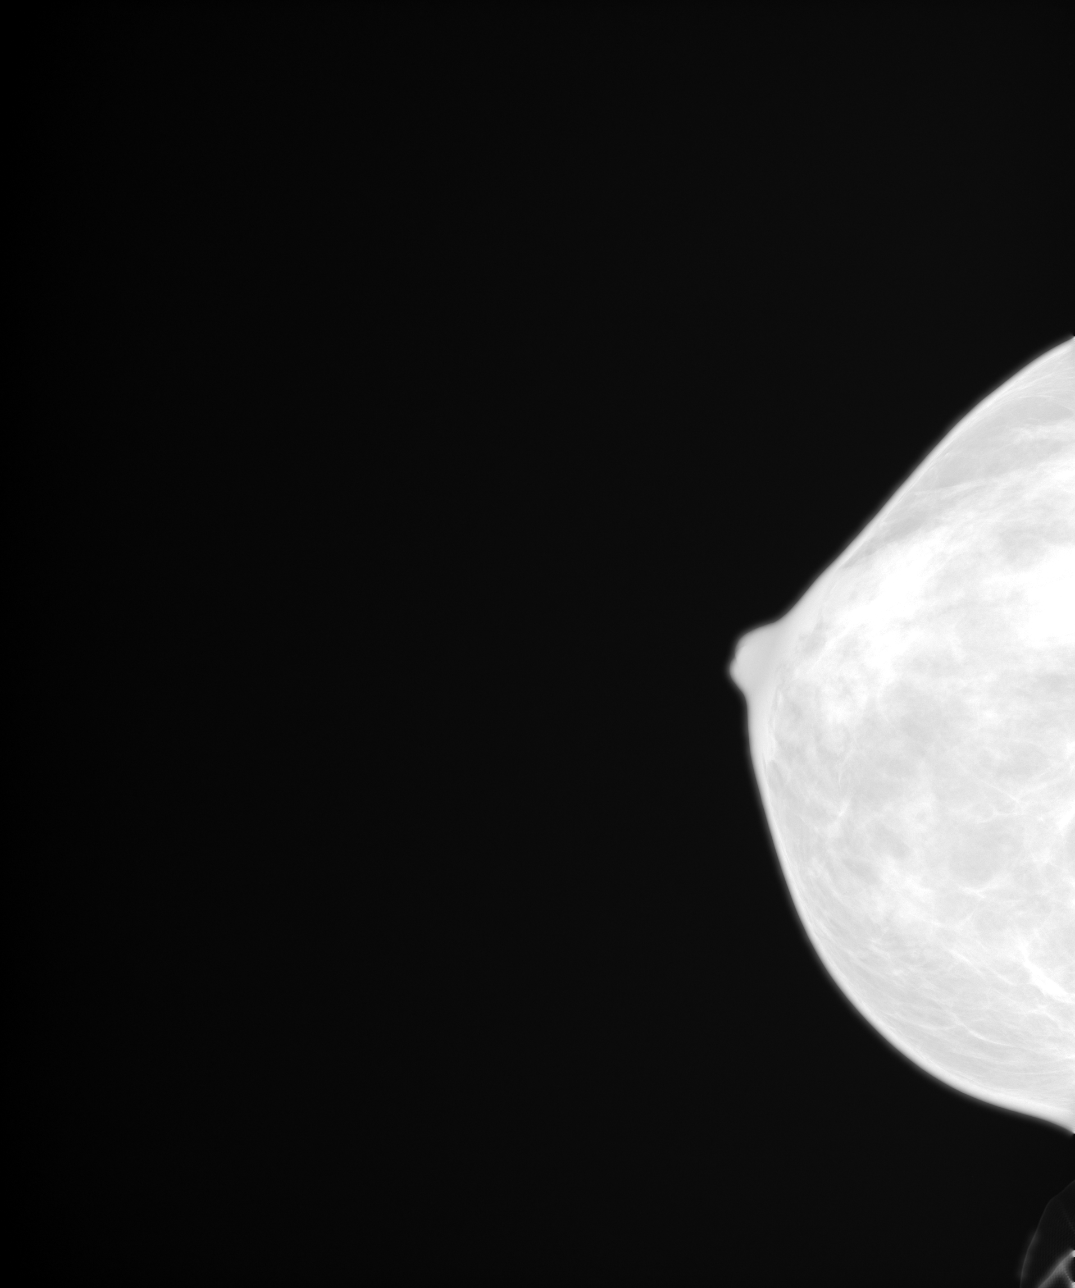
[im 2/4]
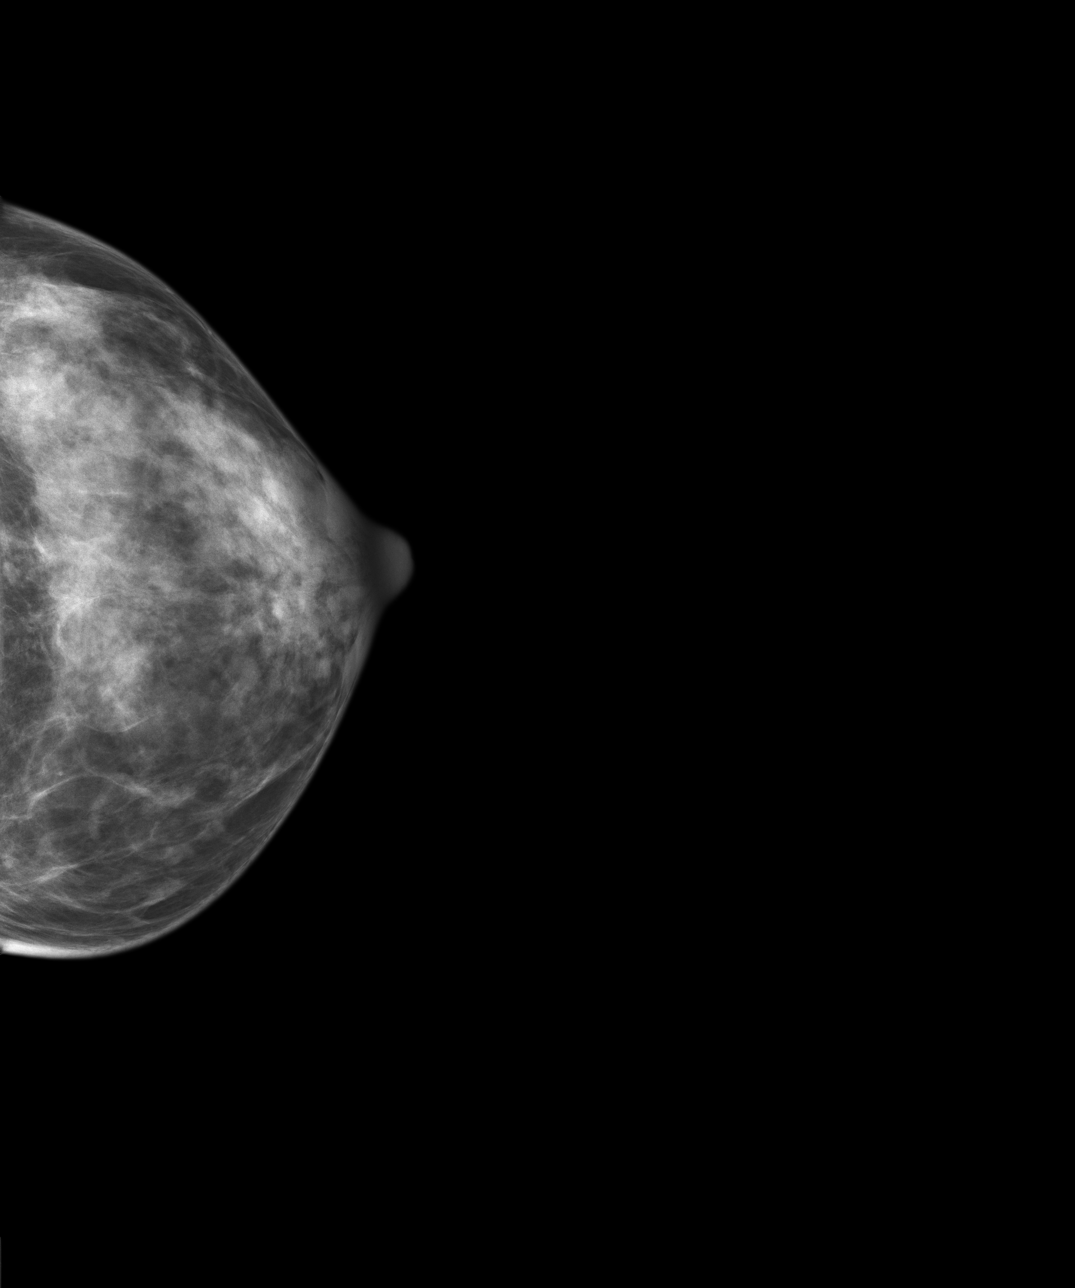
[im 3/4]
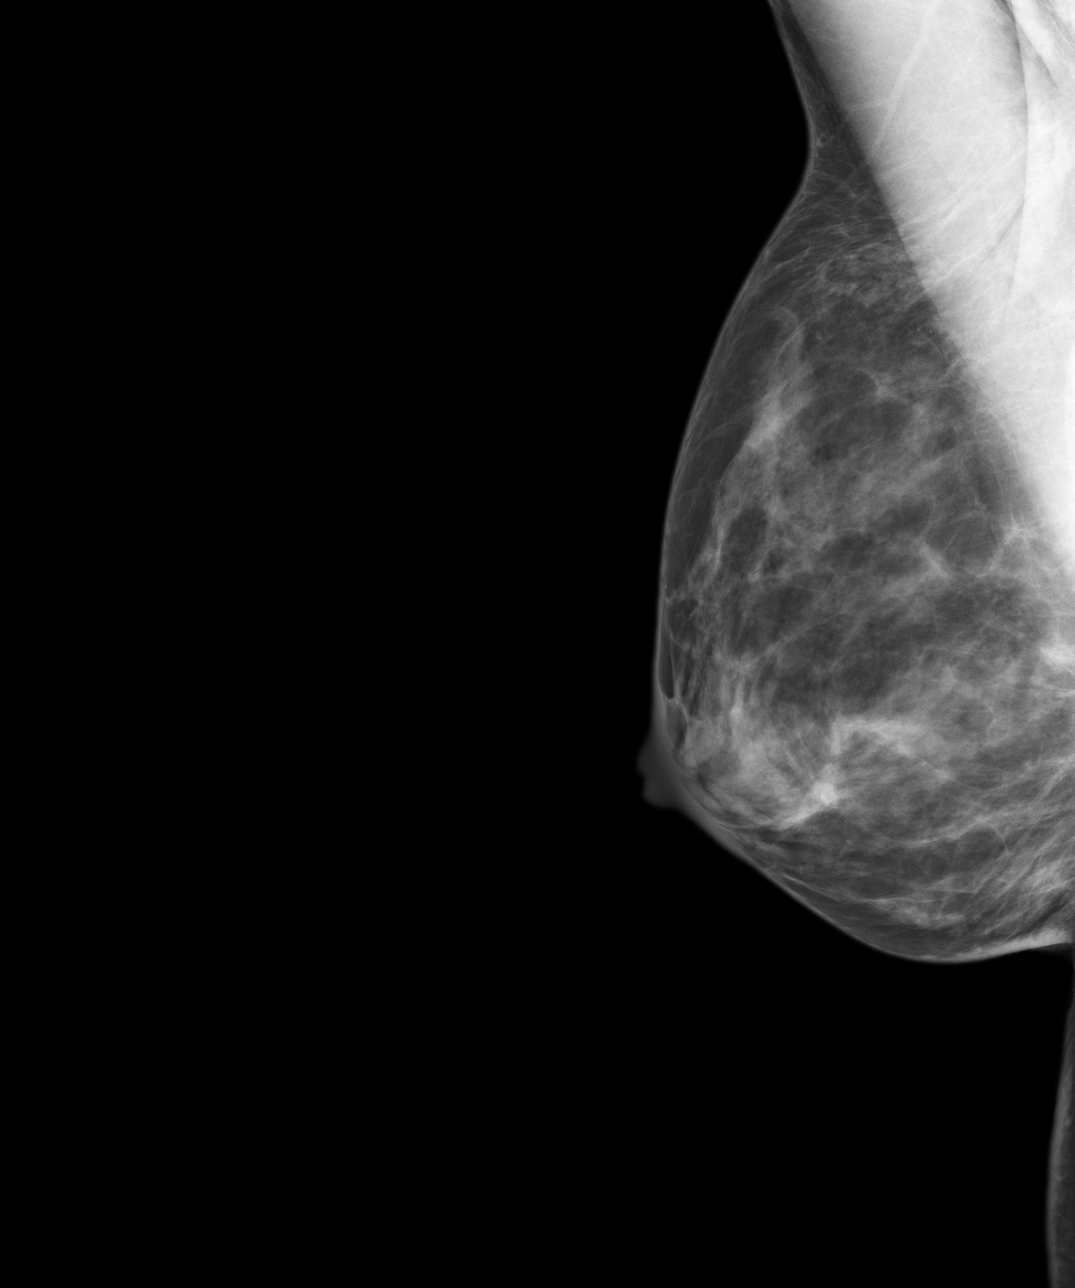
[im 4/4]
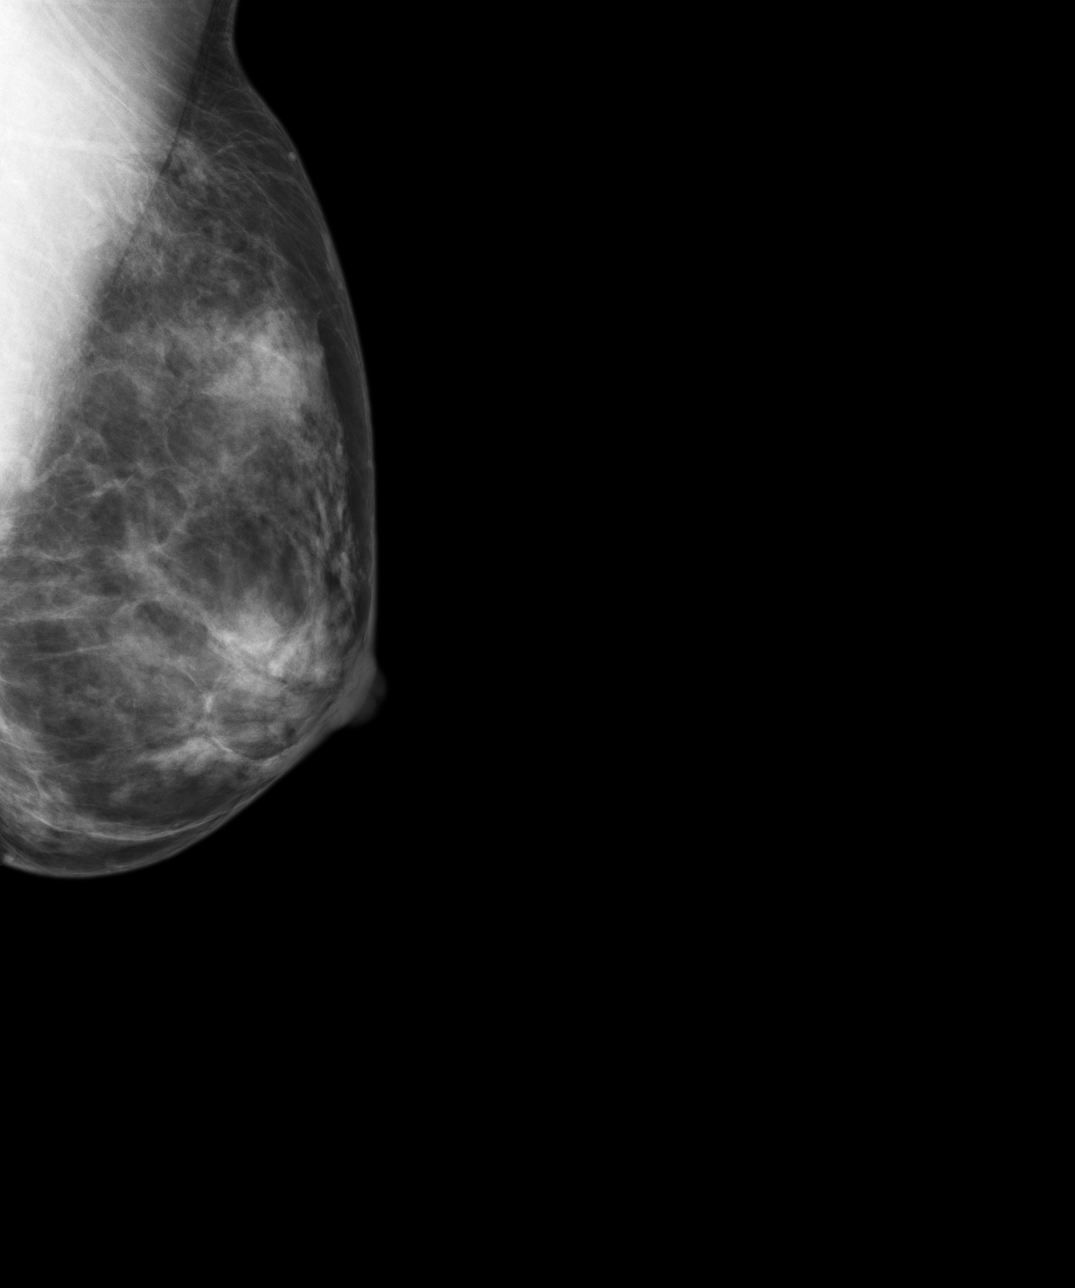

[4 of 4 positions shown; findings below may reference images not displayed]

FINDINGS: The breasts are heterogenously dense.  There is an asymmetric
density in the RIGHT breast along the posterior nipple line in the posterior
one-third of the breast tissue not well seen on the CC view.
There are no dominant masses or areas of architectural distortion.  No
microcalcifications.
IMPRESSION: 1.     Asymmetric density in the RIGHT breast just below the posterior
nipple line in the posterior one-third of the breast. Recommend spot
compression views of this area.
2.     BI-RADS:  Category 0-Needs Additional Imaging Evaluation.

A NEGATIVE MAMMOGRAM REPORT DOES NOT PRECLUDE BIOPSY OR OTHER EVALUATION OF
A CLINICALLY PALPABLE OR OTHERWISE SUSPICIOUS MASS OR LESION.  BREAST CANCER
MAY NOT BE DETECTED BY MAMMOGRAPHY IN UP TO 10% OF CASES.

## 2008-07-09 ENCOUNTER — Ambulatory Visit: Payer: Self-pay | Admitting: Obstetrics and Gynecology

## 2008-07-09 IMAGING — MG MAM DIG ADDVIEWS RT SCR
2 series · 3 of 3 positions shown · non-contrast
Comparison: [DATE] [HOSPITAL] [REDACTED], [DATE] [HOSPITAL].

REASON FOR EXAM: right breast asymmetric density
COMMENTS:

PROCEDURE:     MAM - MAM DIG ADDVIEWS RT SCR  - [DATE] [DATE]
RESULT:
HISTORY: 56-year-old female presents for further evaluation of RIGHT
inferior breast asymmetry.

[R ML · right · 2 of 2 slices shown]
[im 1/2]
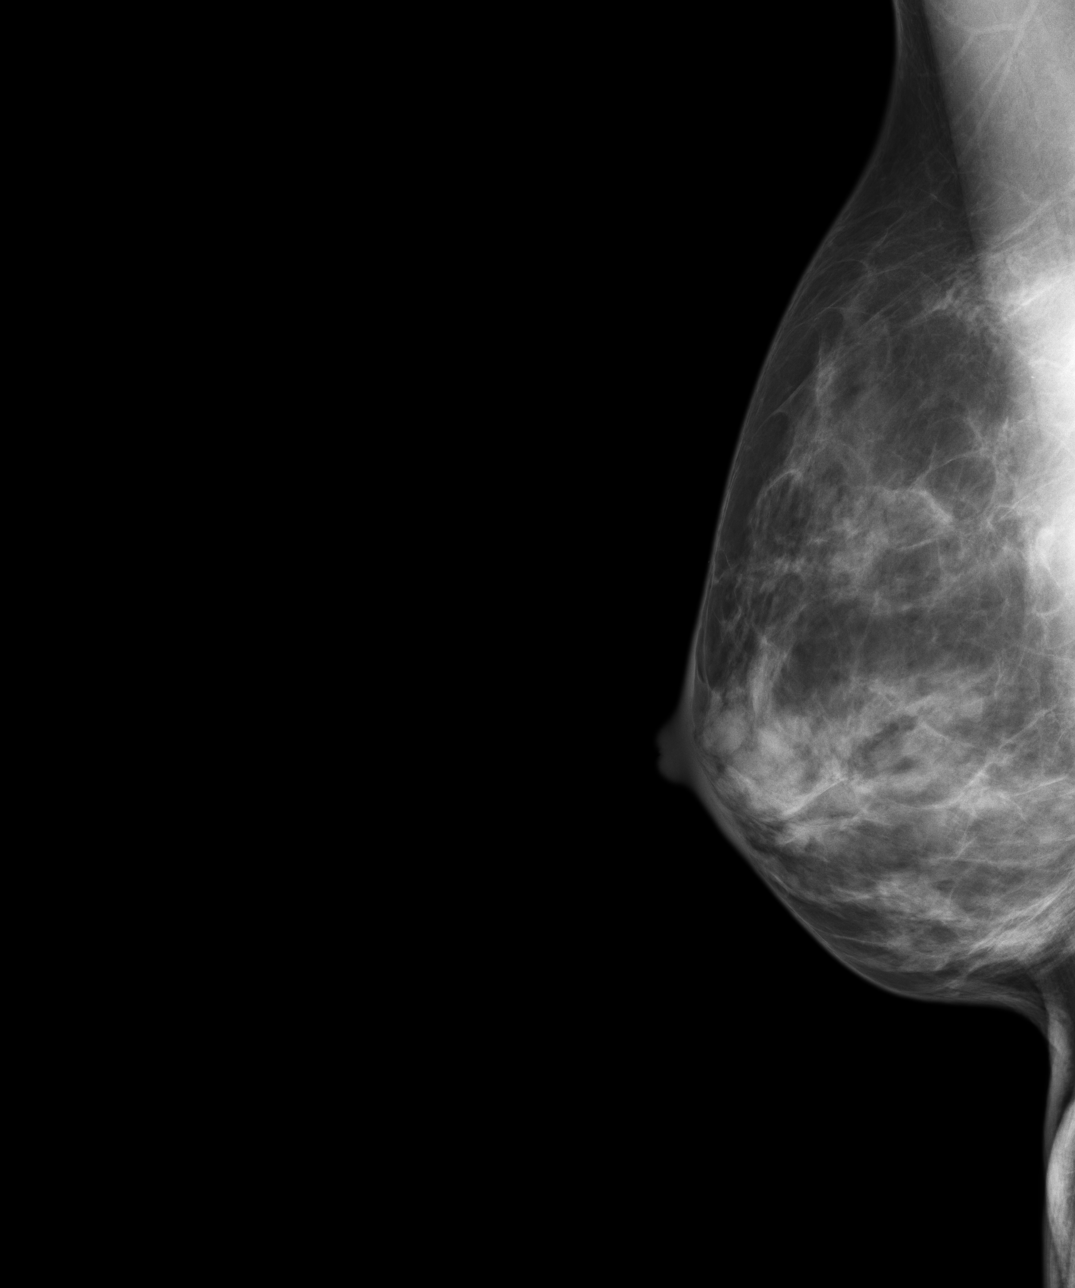
[im 2/2]
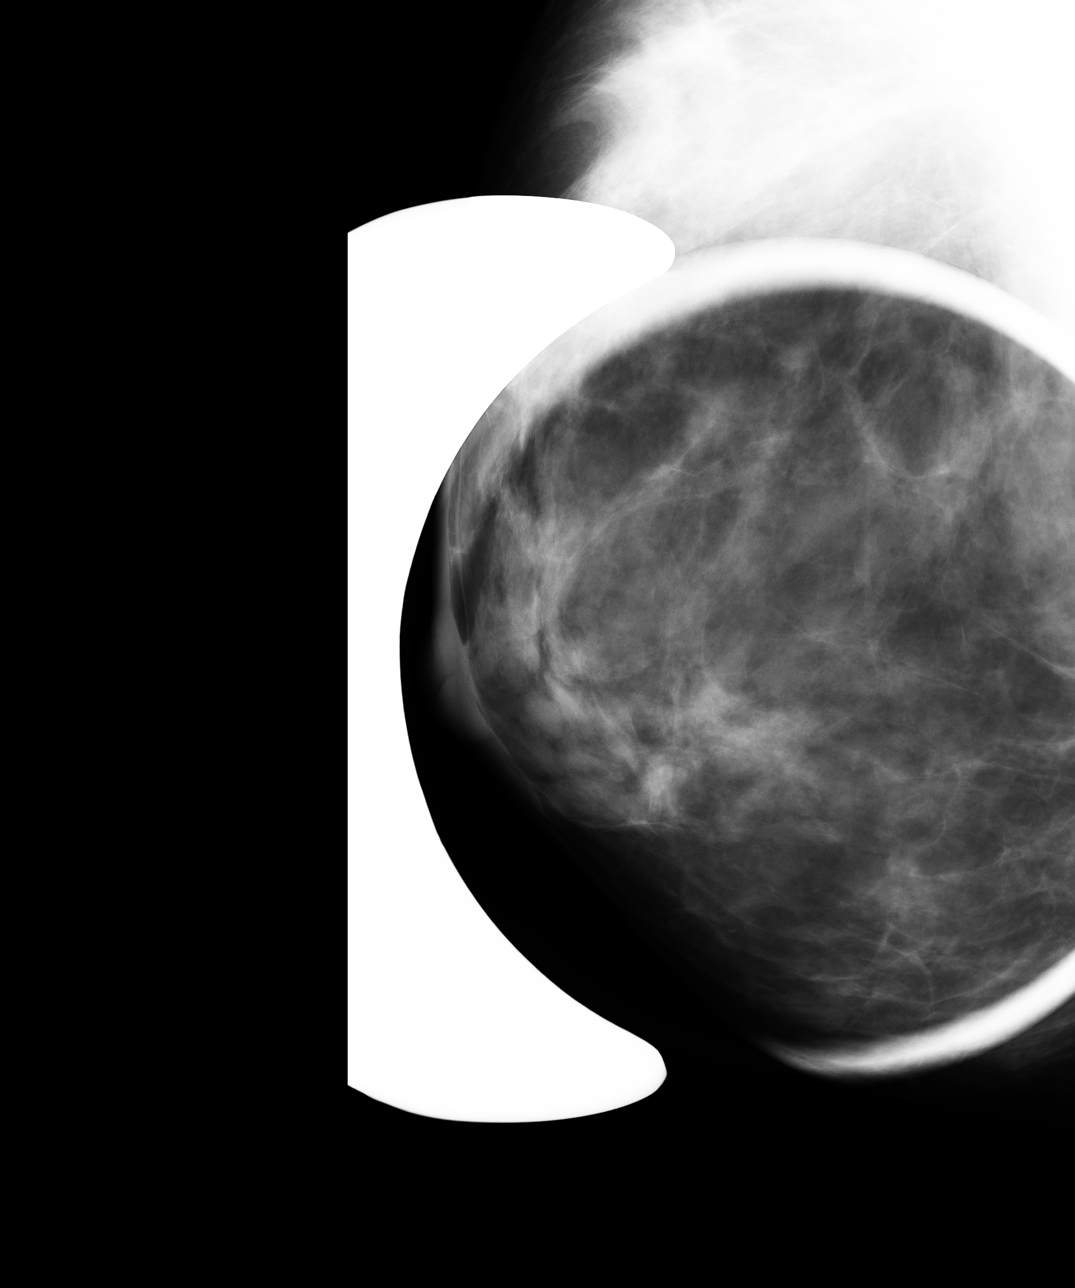

[R MLO]
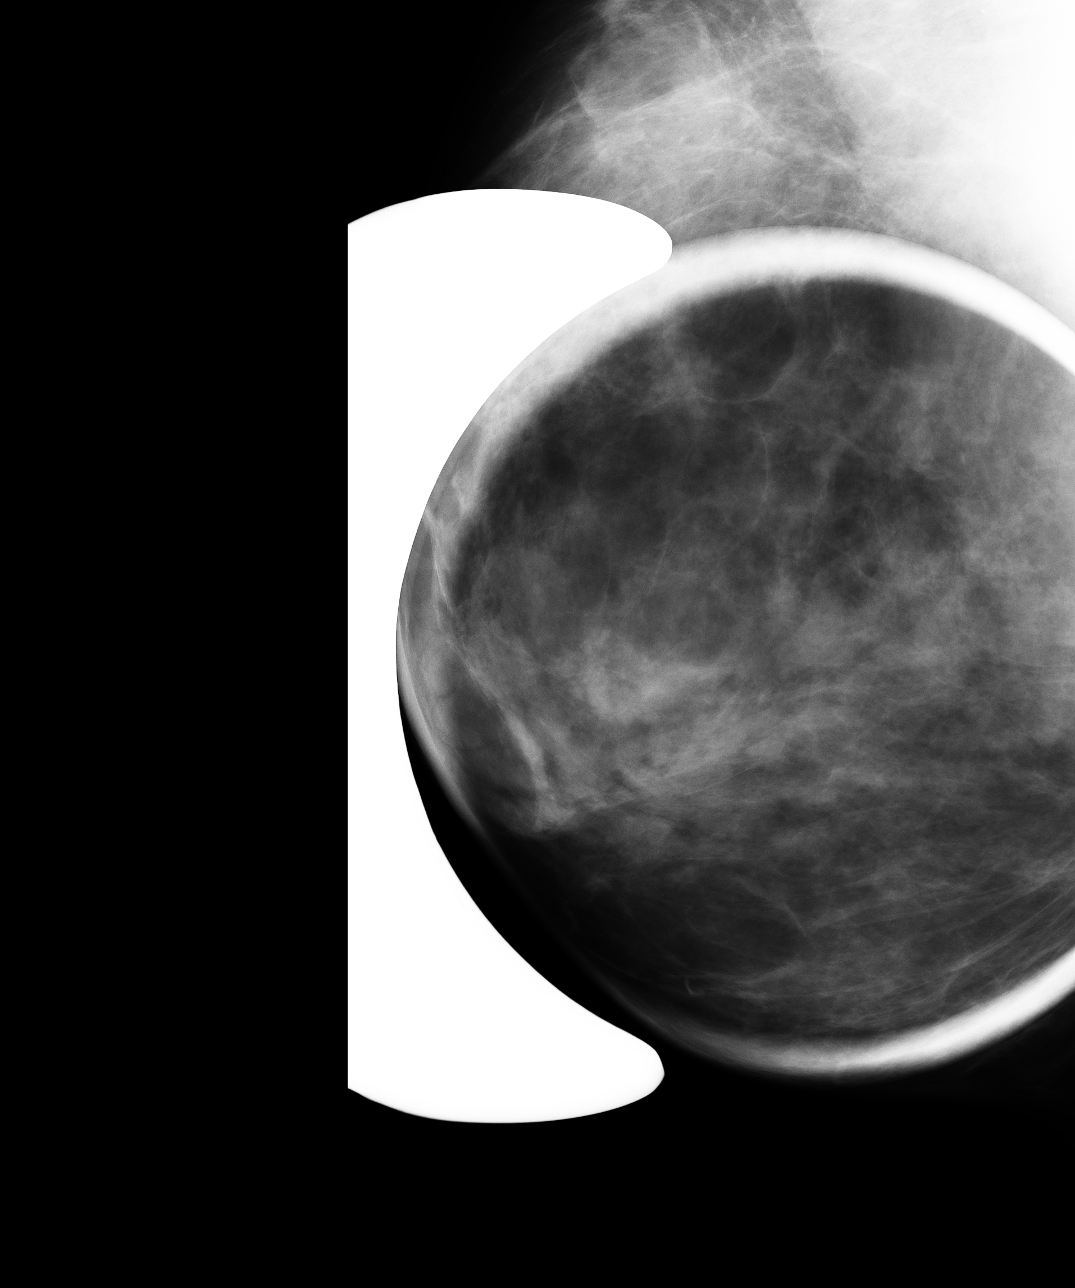

[3 of 3 positions shown; findings below may reference images not displayed]

FINDINGS: ML view of the RIGHT breast and spot compression view of the
inferior/posterior RIGHT breast on the MLO view demonstrates obscuration of
the RIGHT breast asymmetry with blending in of the tissue with adjacent
fibroglandular tissue consistent with normal fibroglandular elements. There
is no dominant mass, persistent architectural distortion or
microcalcifications.
IMPRESSION: 1.     Negative RIGHT breast diagnostic mammogram.
2.     Return to annual mammographic evaluation recommended.
3.     BI-RADS:  Category 2-Benign Finding.

A NEGATIVE MAMMOGRAM REPORT DOES NOT PRECLUDE BIOPSY OR OTHER EVALUATION OF
A CLINICALLY PALPABLE OR OTHERWISE SUSPICIOUS MASS OR LESION.  BREAST CANCER
MAY NOT BE DETECTED BY MAMMOGRAPHY IN UP TO 10% OF CASES.

## 2009-06-18 ENCOUNTER — Encounter: Admission: RE | Admit: 2009-06-18 | Discharge: 2009-06-18 | Payer: Self-pay | Admitting: Internal Medicine

## 2009-10-01 ENCOUNTER — Ambulatory Visit: Payer: Self-pay | Admitting: Obstetrics and Gynecology

## 2009-10-01 IMAGING — MG MAM DGTL SCREENING MAMMO W/CAD
1 series · 4 of 4 positions shown · non-contrast
Comparison: none

REASON FOR EXAM: scr mammo
COMMENTS:

[R CC · right · 4 of 4 slices shown]
[im 1/4]
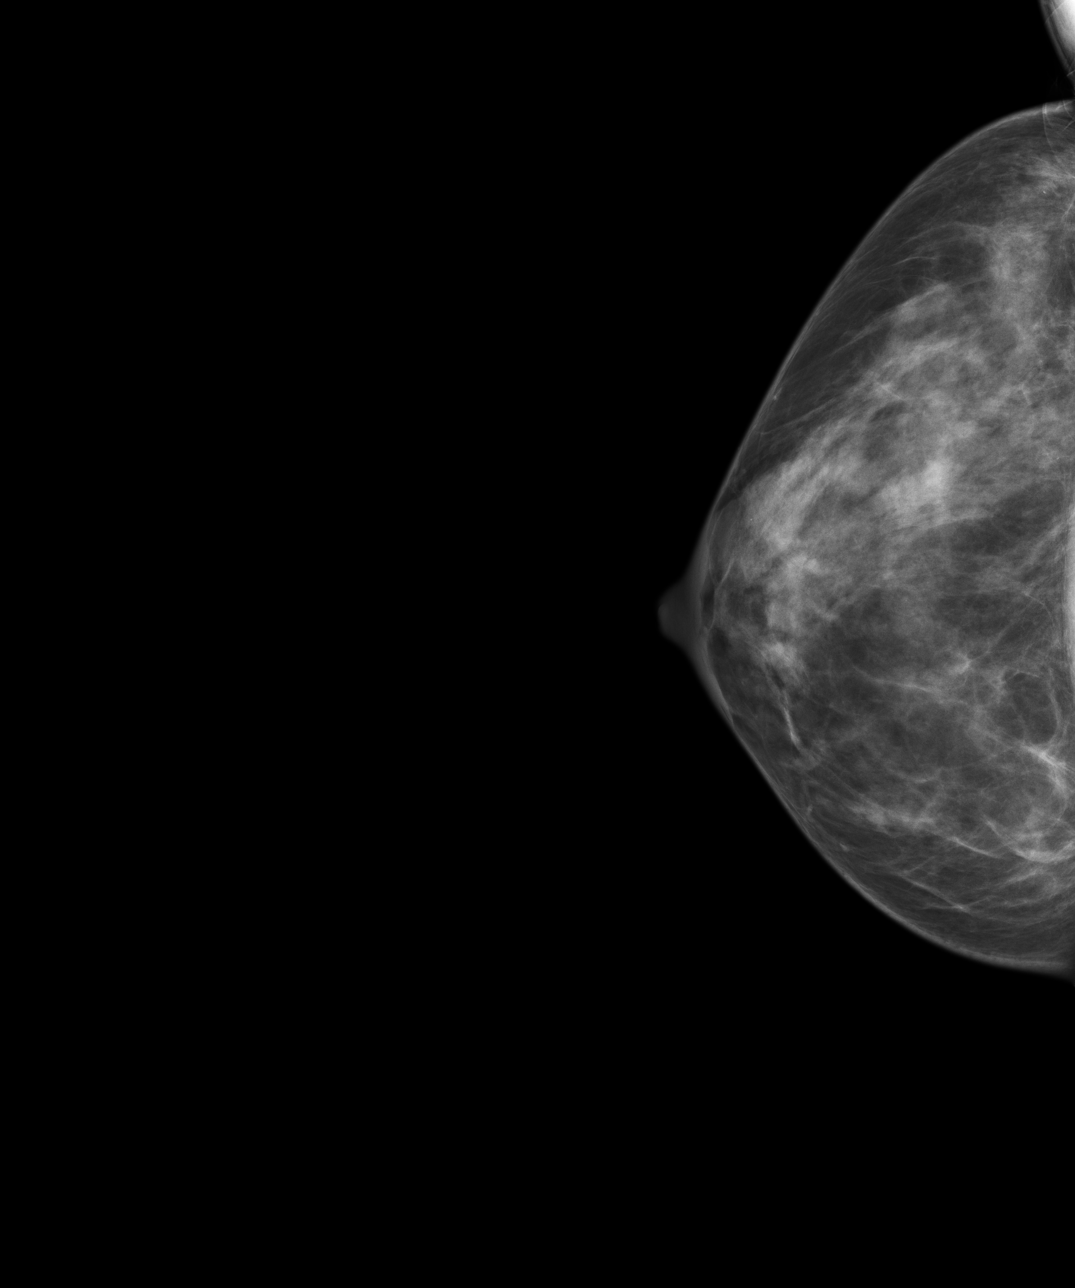
[im 2/4]
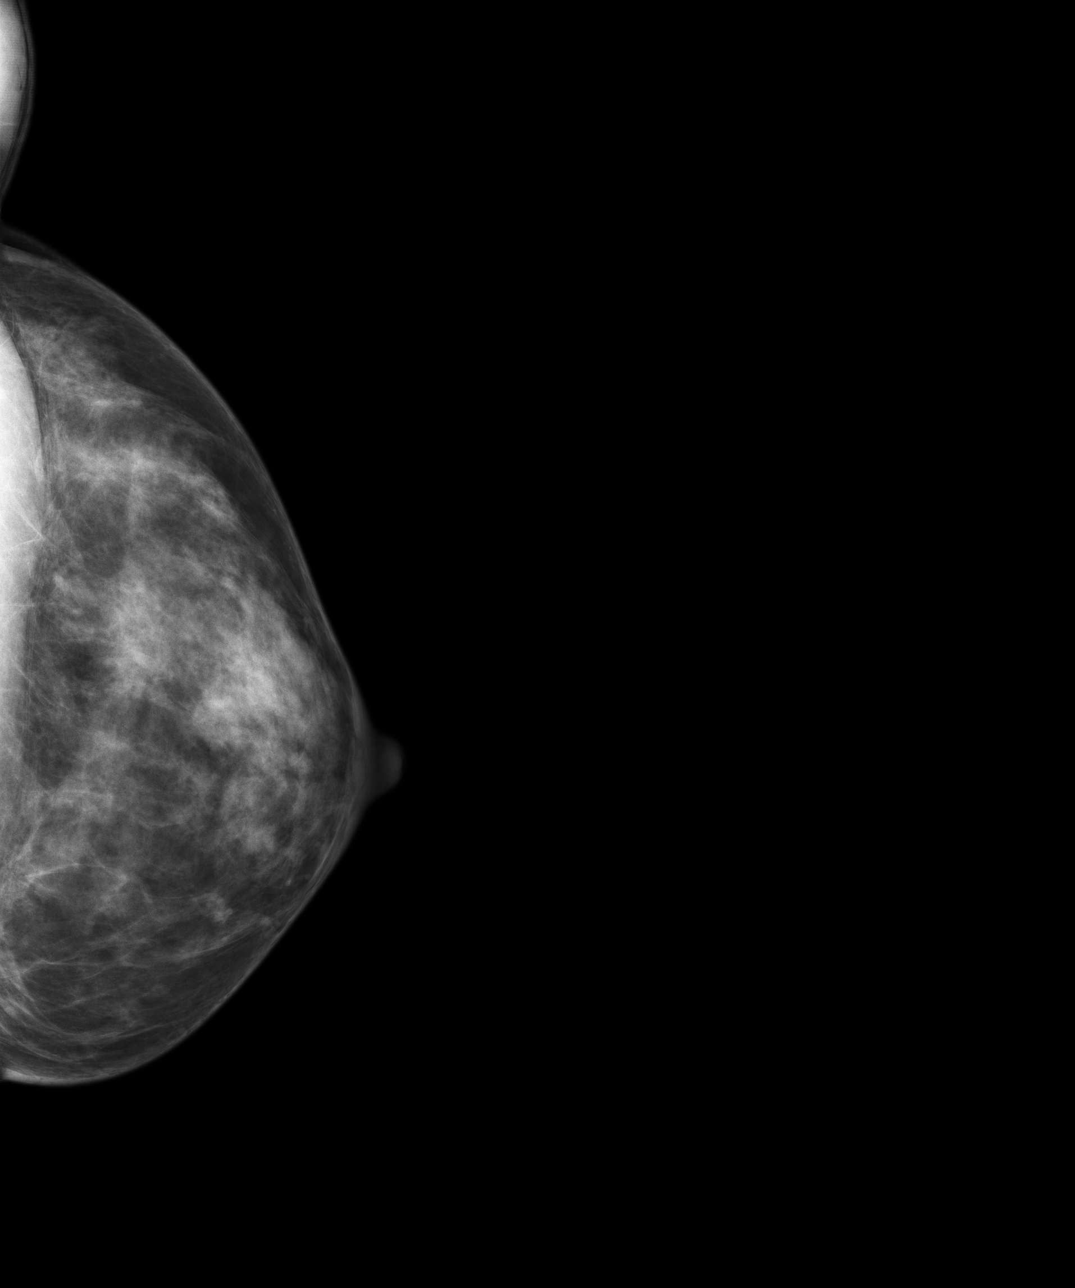
[im 3/4]
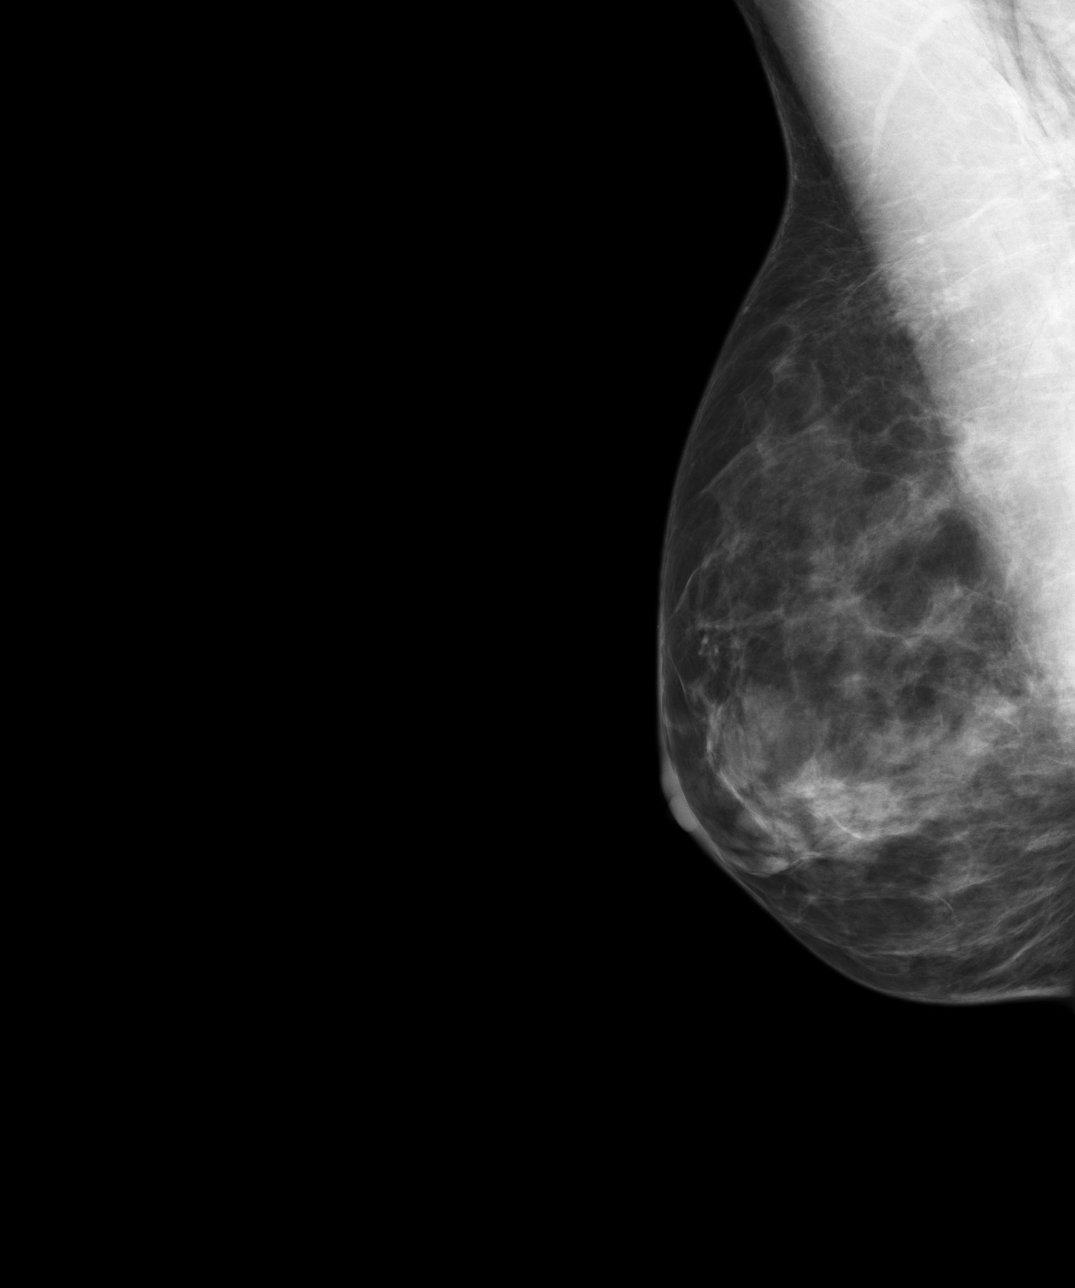
[im 4/4]
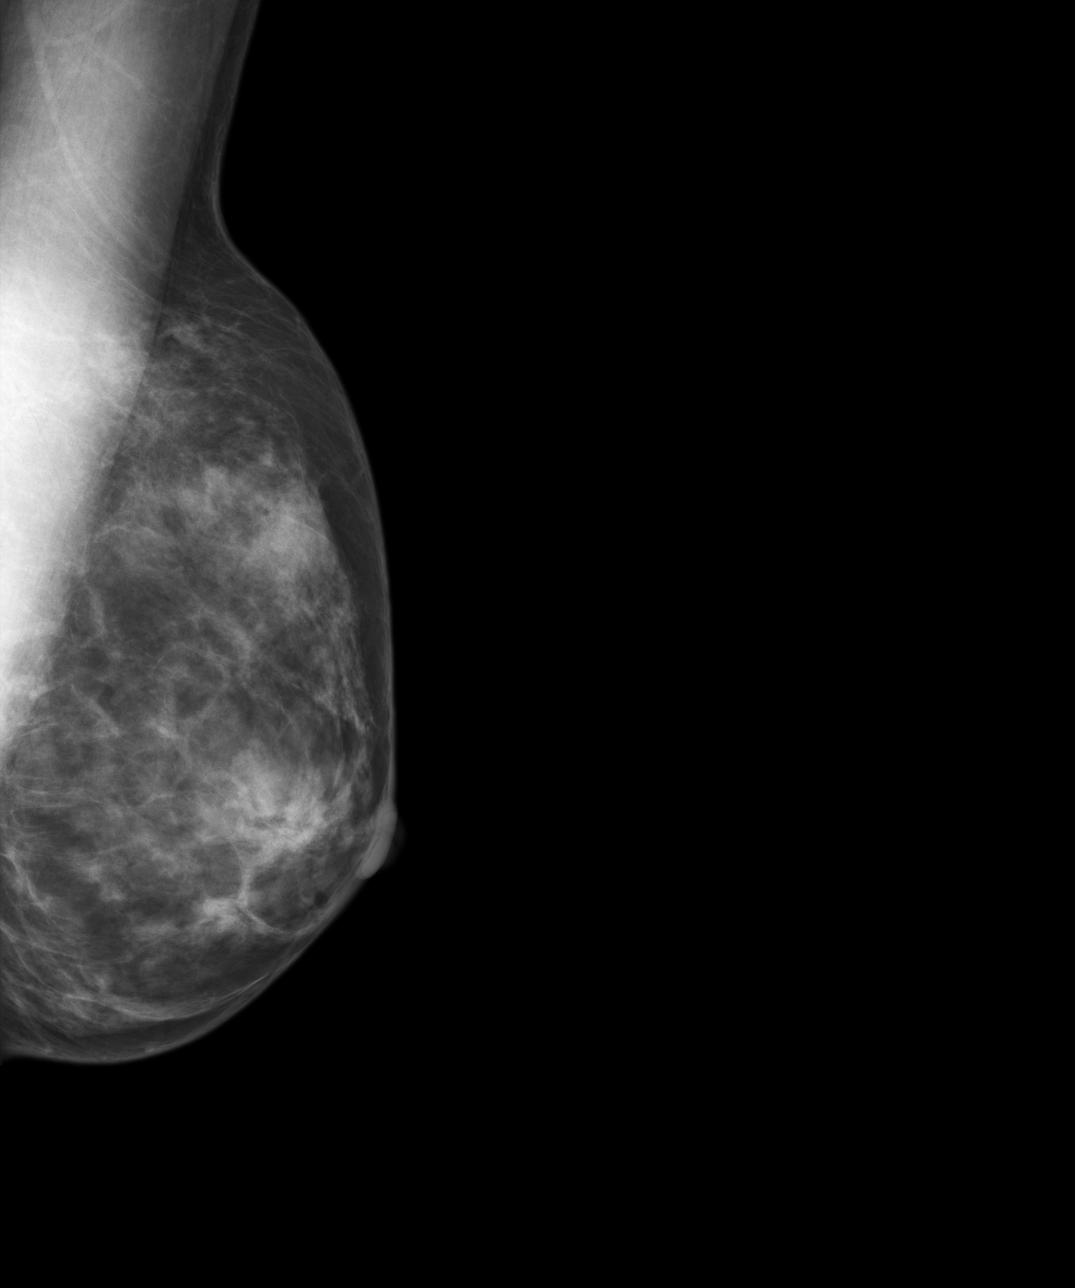

[4 of 4 positions shown; findings below may reference images not displayed]

PROCEDURE:     MAM - MAM DGTL SCREENING MAMMO W/CAD  - [DATE]  [DATE]

RESULT:     Comparison is made to prior studies dated [DATE], [DATE]
and [DATE].

The breasts demonstrate a moderately dense heterogeneous fibronodular
parenchymal pattern. There is no new radiographic evidence to suggest
malignancy.
IMPRESSION: BI-RADS: Category 2 - Benign Findings

Thank you for this opportunity to contribute to the care of your patient.

A NEGATIVE MAMMOGRAM REPORT DOES NOT PRECLUDE BIOPSY OR OTHER EVALUATION OF
A CLINICALLY PALPABLE OR OTHERWISE SUSPICIOUS MASS OR LESION. BREAST CANCER
MAY NOT BE DETECTED BY MAMMOGRAPHY IN UP TO 10% OF CASES.

## 2010-12-05 HISTORY — PX: ESOPHAGOGASTRODUODENOSCOPY: SHX1529

## 2010-12-25 ENCOUNTER — Encounter: Payer: Self-pay | Admitting: Internal Medicine

## 2010-12-26 ENCOUNTER — Encounter: Payer: Self-pay | Admitting: Internal Medicine

## 2010-12-29 ENCOUNTER — Ambulatory Visit: Payer: Self-pay | Admitting: Obstetrics and Gynecology

## 2010-12-29 IMAGING — MG MAM DGTL SCREENING MAMMO W/CAD
1 series · 4 of 4 positions shown · non-contrast
Comparison: none

REASON FOR EXAM: scr
COMMENTS:

PROCEDURE:     MAM - MAM DGTL SCREENING MAMMO W/CAD  - [DATE]  [DATE]
RESULT:     Comparison is made to prior examinations [DATE],[DATE] and [DATE].
The breast parenchyma bilaterally is heterogeneously dense. No mass or
malignant appearing microcalcifications are seen.

[Series 9343: R CC · right · 4 of 4 slices shown]
[im 1/4]
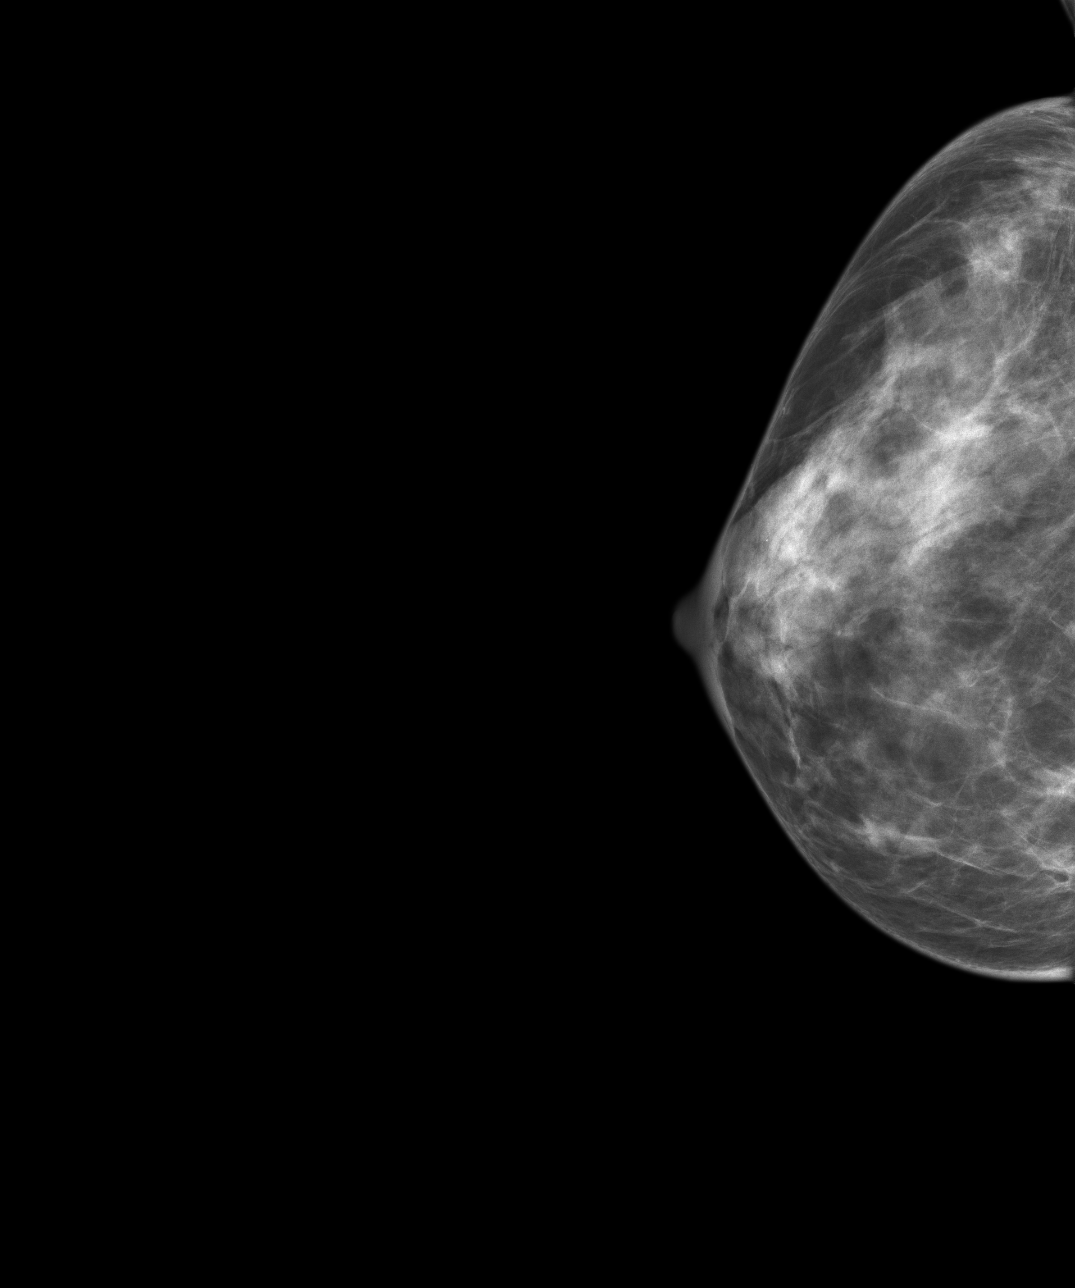
[im 2/4]
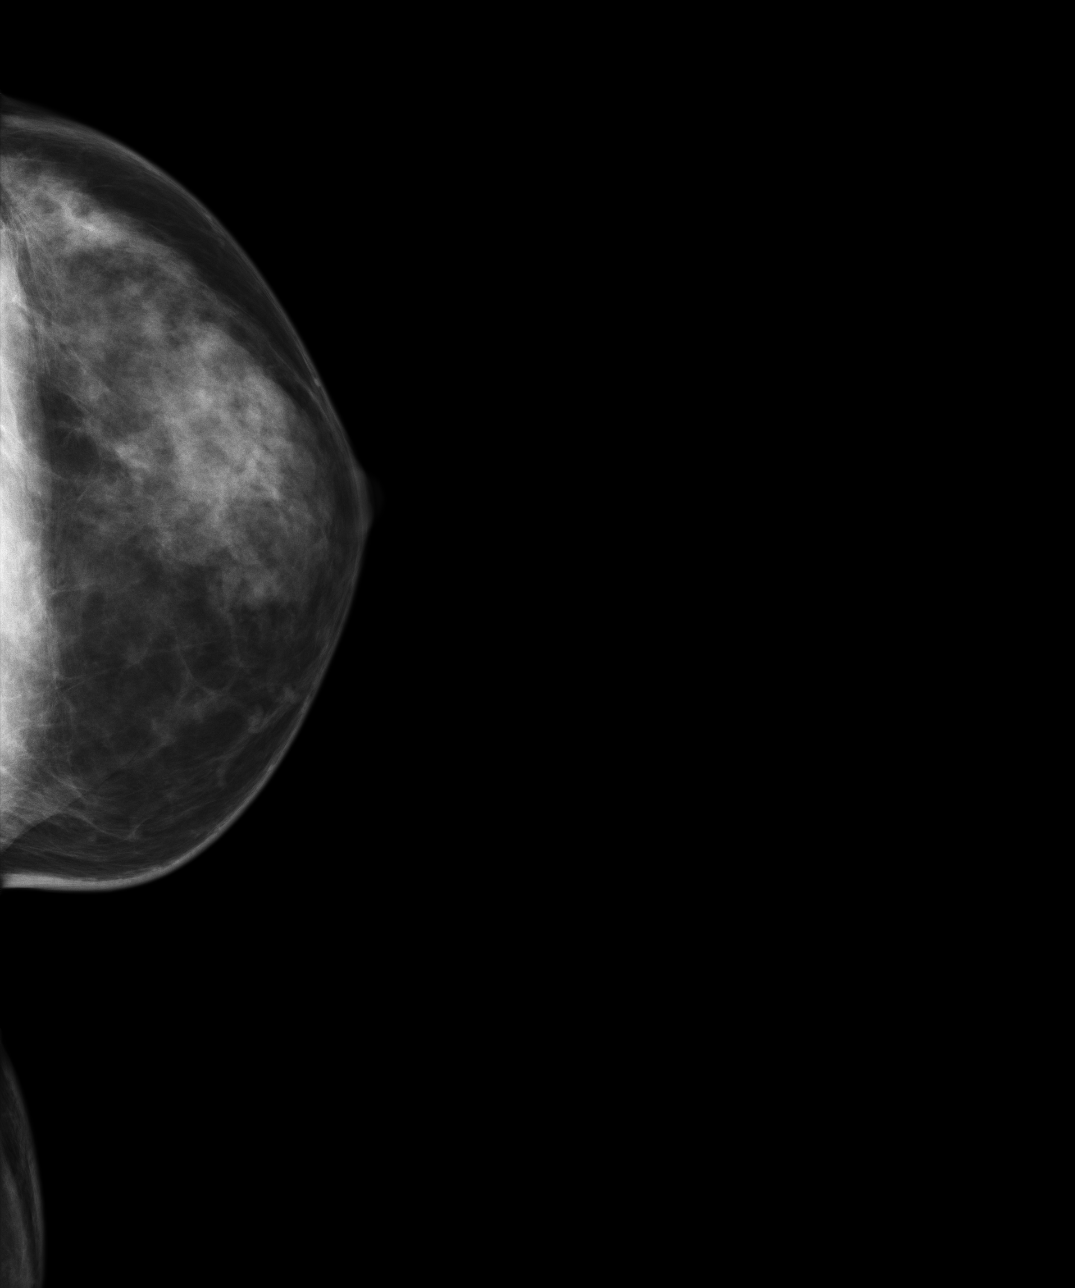
[im 3/4]
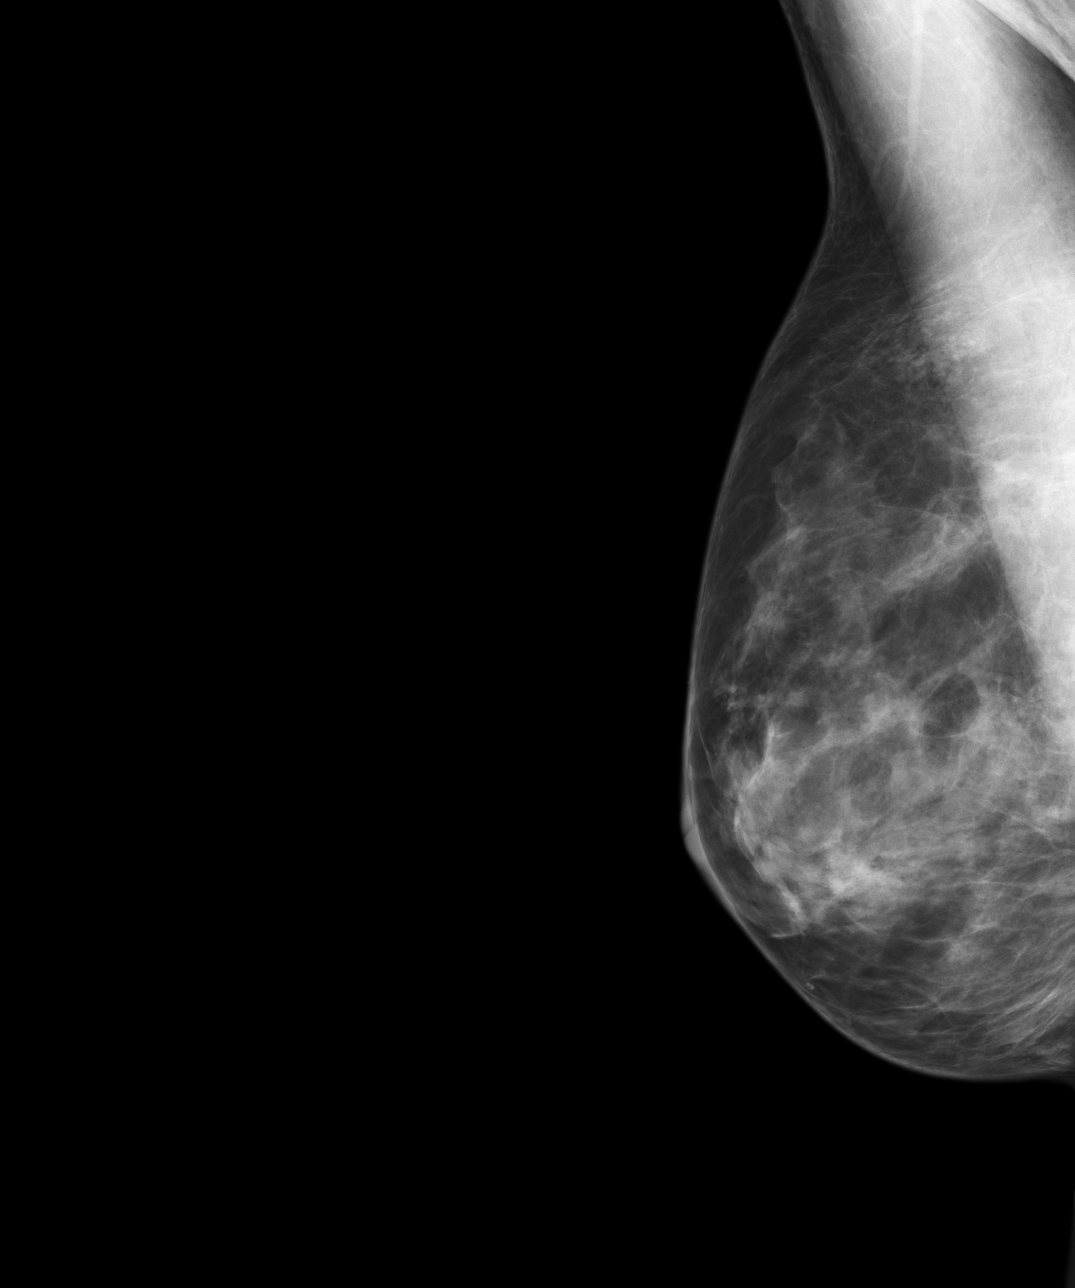
[im 4/4]
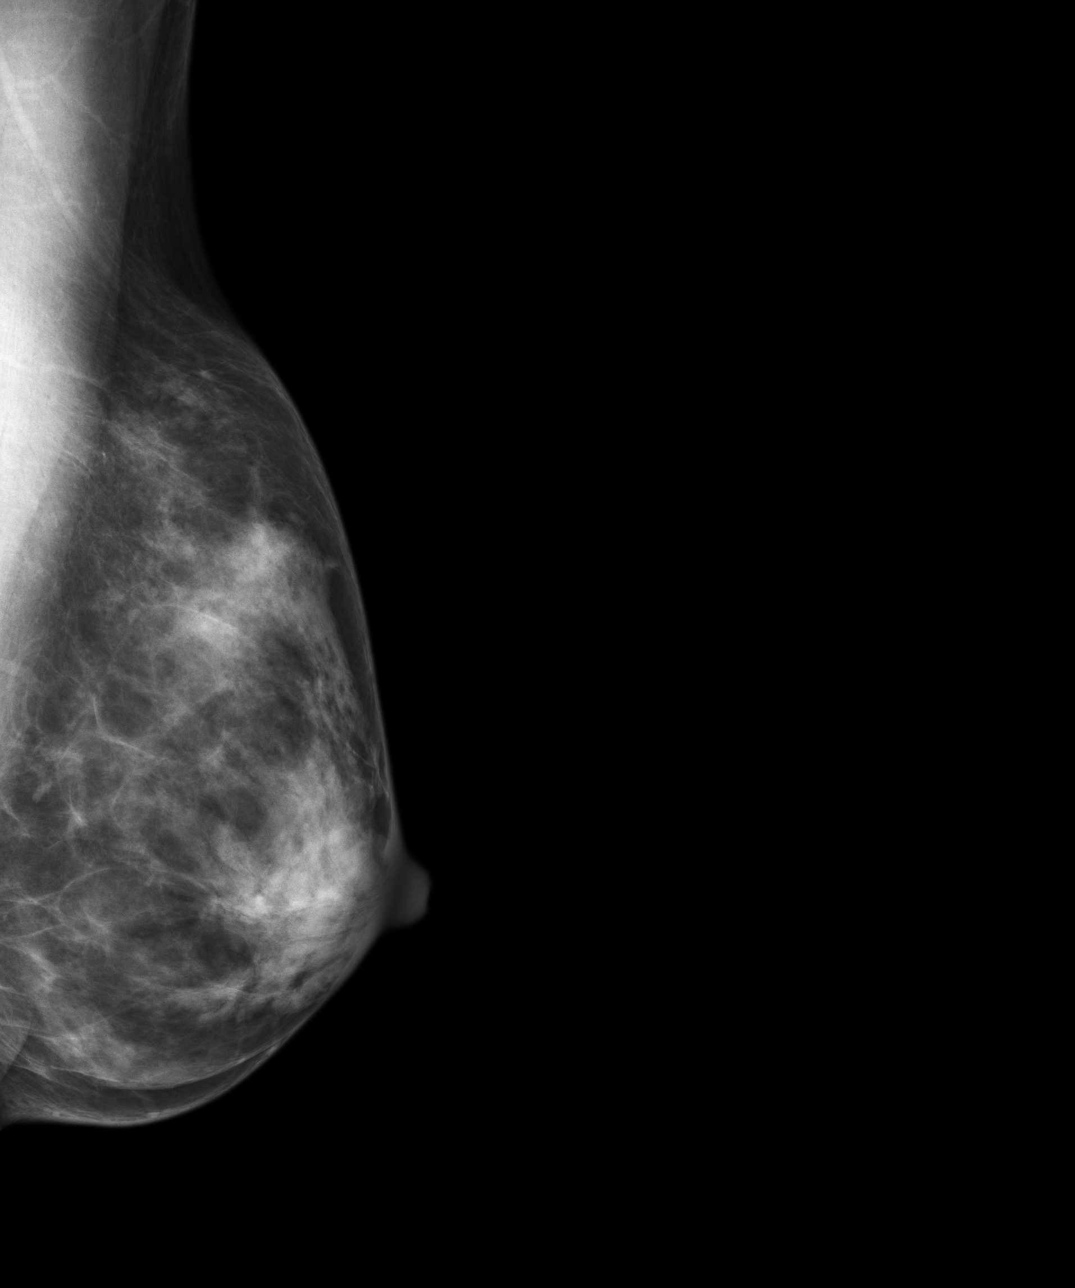

[4 of 4 positions shown; findings below may reference images not displayed]

IMPRESSION: 1.  Bilaterally benign appearing screening mammography.
2.  Continued annual screening mammography is recommended.
3.  BI-RADS: Category 1 Negative.

Thank you for this opportunity to contribute to the care of your patient.

A NEGATIVE MAMMOGRAM REPORT DOES NOT PRECLUDE BIOPSY OR OTHER EVALUATION OF
A CLINICALLY PALPABLE OR OTHERWISE SUSPICIOUS MASS OR LESION. BREAST CANCER
MAY NOT BE DETECTED BY MAMMOGRAPHY IN UP TO 10% OF CASES.

## 2011-04-22 NOTE — Op Note (Signed)
[facility]  Patient:    Megan Cox, Megan Cox Visit Number: [id number] MRN: [id number]          Service Type: DSU Location: DAY Attending Physician:  Rosalee Kaufman Dictated by:   Harl Bowie, M.D. Proc. Date: 04/04/02 Admit Date:  04/04/2002                             Operative Report  PREOPERATIVE DIAGNOSIS:  Postmenopausal bleeding with hormone replacement therapy.  POSTOPERATIVE DIAGNOSIS:  Postmenopausal bleeding with hormone replacement therapy.  OPERATION PERFORMED:  D&C.  SURGEON:  Harl Bowie, M.D.  ANESTHESIA:  MAC with local.  FINDINGS AND PROCEDURE:  The patient was prepped and draped in the usual fashion for a vaginal procedure. The patient was examined and found to have a uterus that was small with adnexa clear. Following a speculum was placed in the vagina, the cervix was grasped with a single tooth tenaculum. The cervix was sounded to 1 1/2 inches. The cervix was dilated and the cavity entered with a sharp curette. Only a small amount of tissue was obtained. Some additional mucoid tissue was obtained with the Randall stone forceps. The blood loss during the procedure was minimal. The patient tolerated the procedure well and was sent to the recovery room in good condition. Dictated by:   Harl Bowie, M.D. Attending Physician:  Rosalee Kaufman DD:  04/04/02 TD:  04/05/02 Job: [id number]

## 2012-04-05 ENCOUNTER — Ambulatory Visit: Payer: Self-pay | Admitting: Obstetrics and Gynecology

## 2012-04-05 IMAGING — MG MAM DGTL SCRN MAM NO ORDER W/CAD
1 series · 4 of 4 positions shown · non-contrast
Comparison: none

REASON FOR EXAM: scr mammo no order
COMMENTS:

[R CC · right · 4 of 4 slices shown]
[im 1/4]
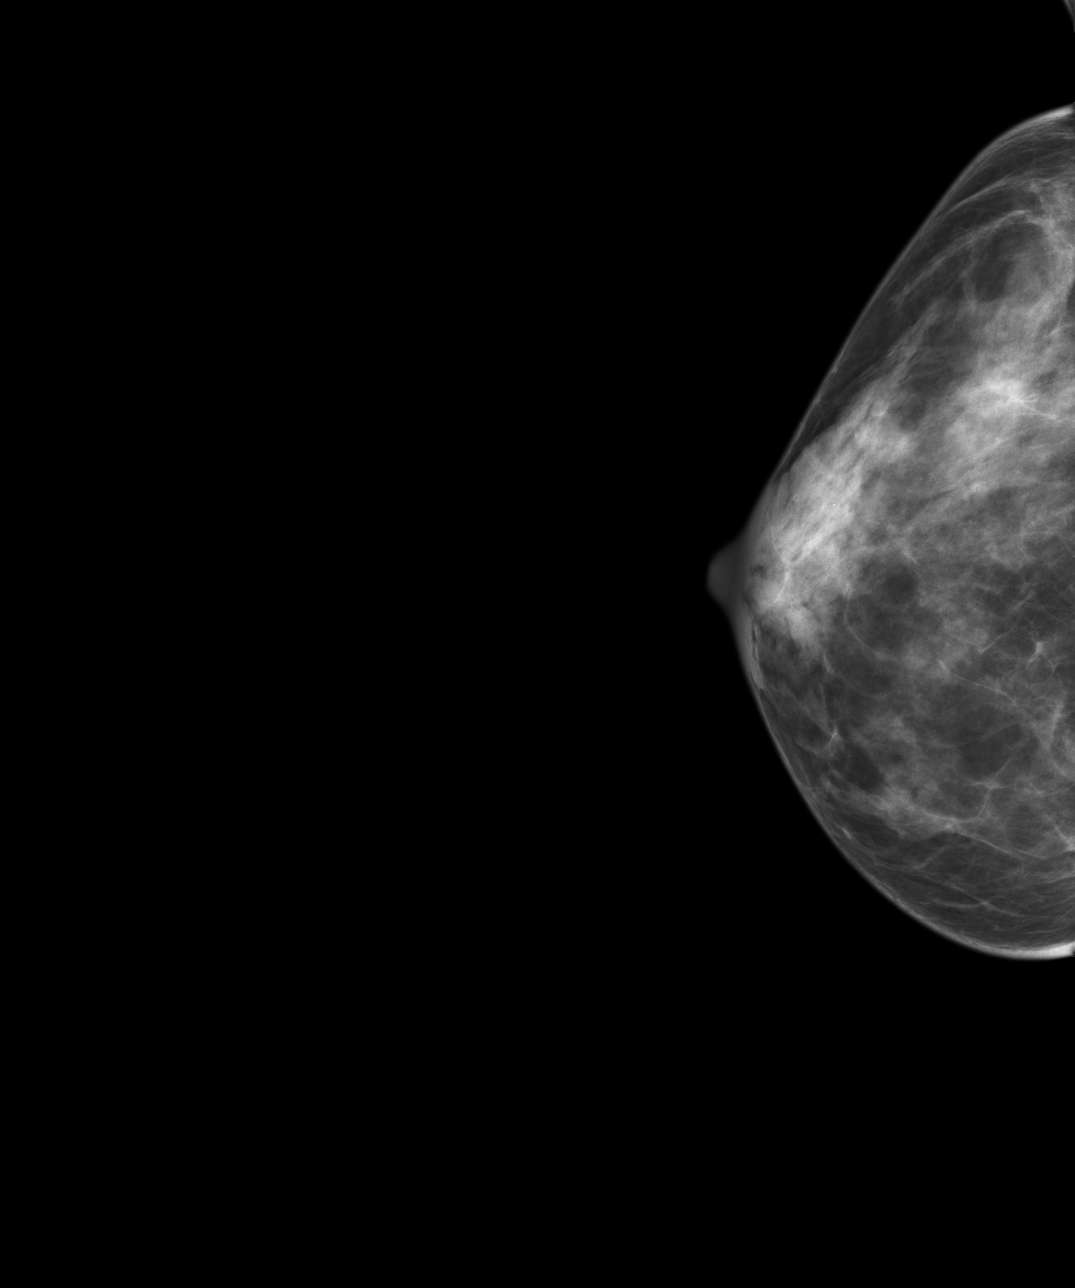
[im 2/4]
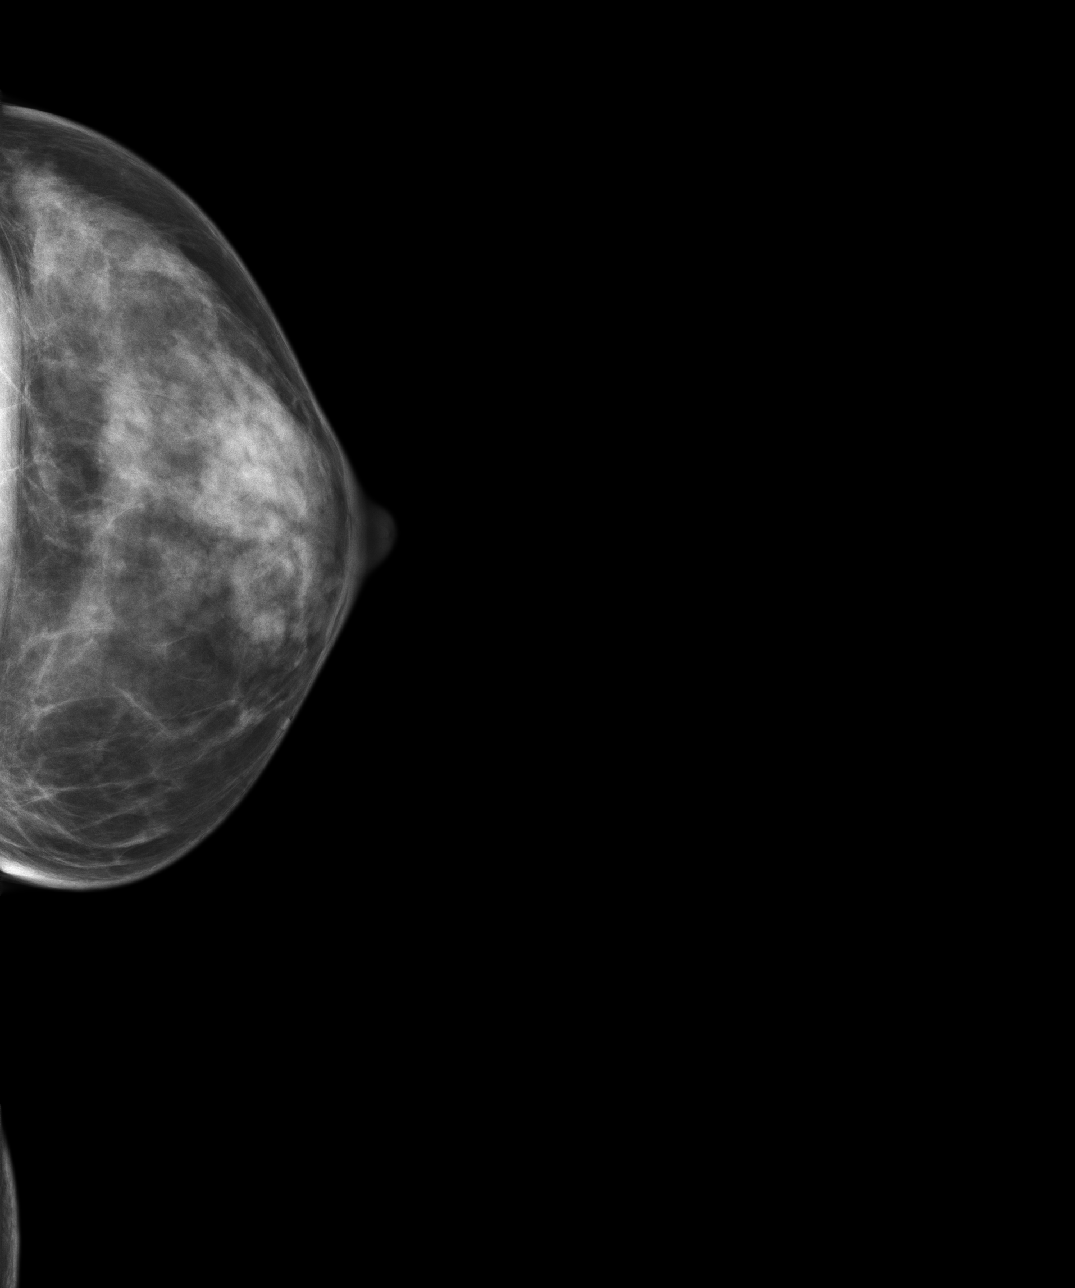
[im 3/4]
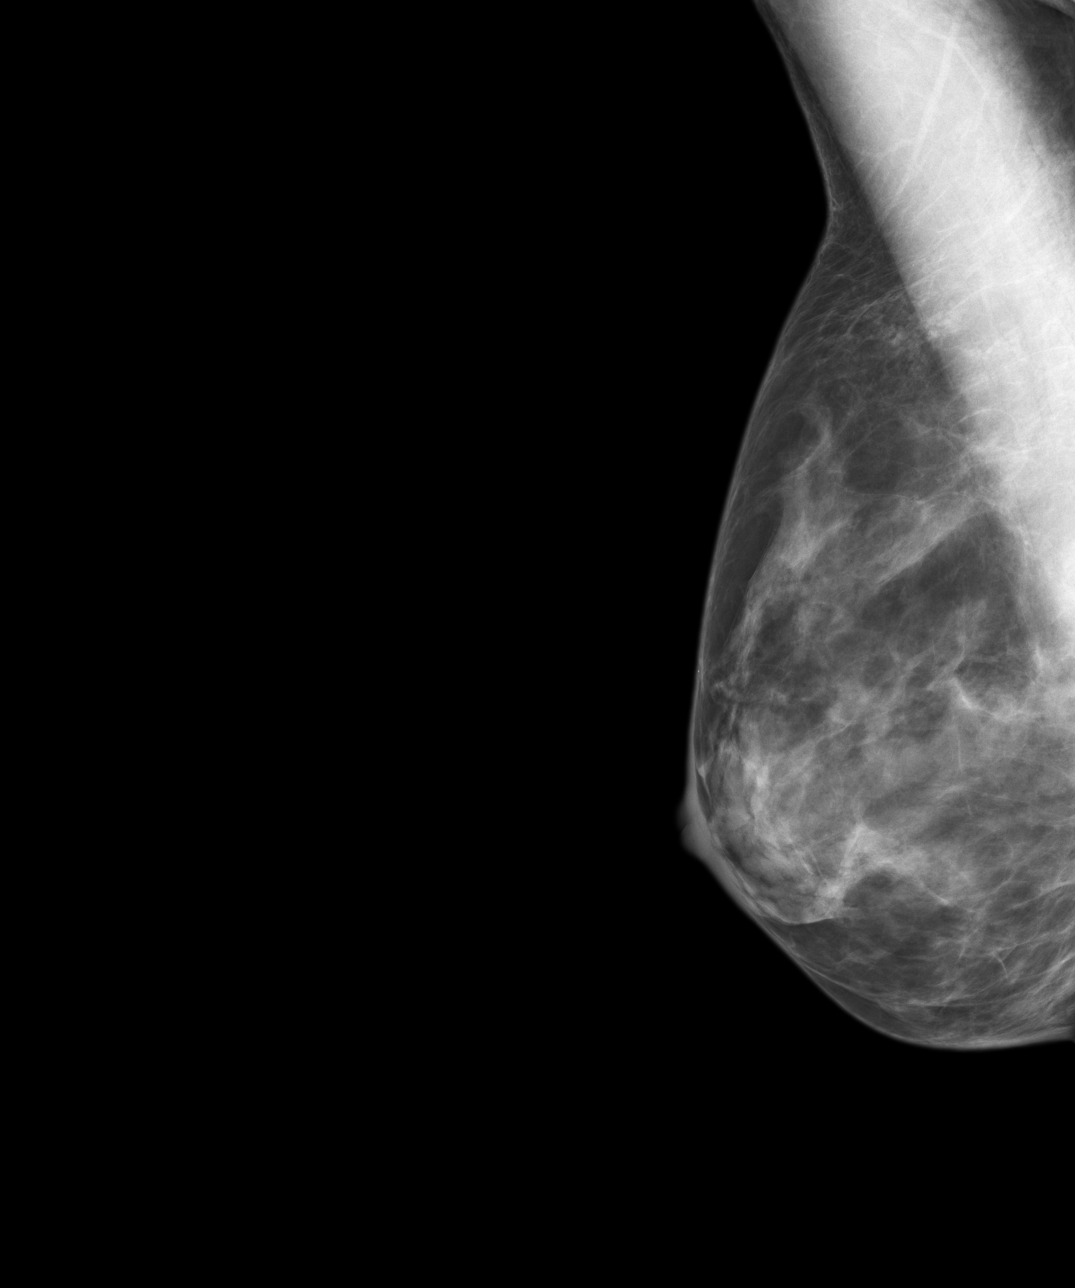
[im 4/4]
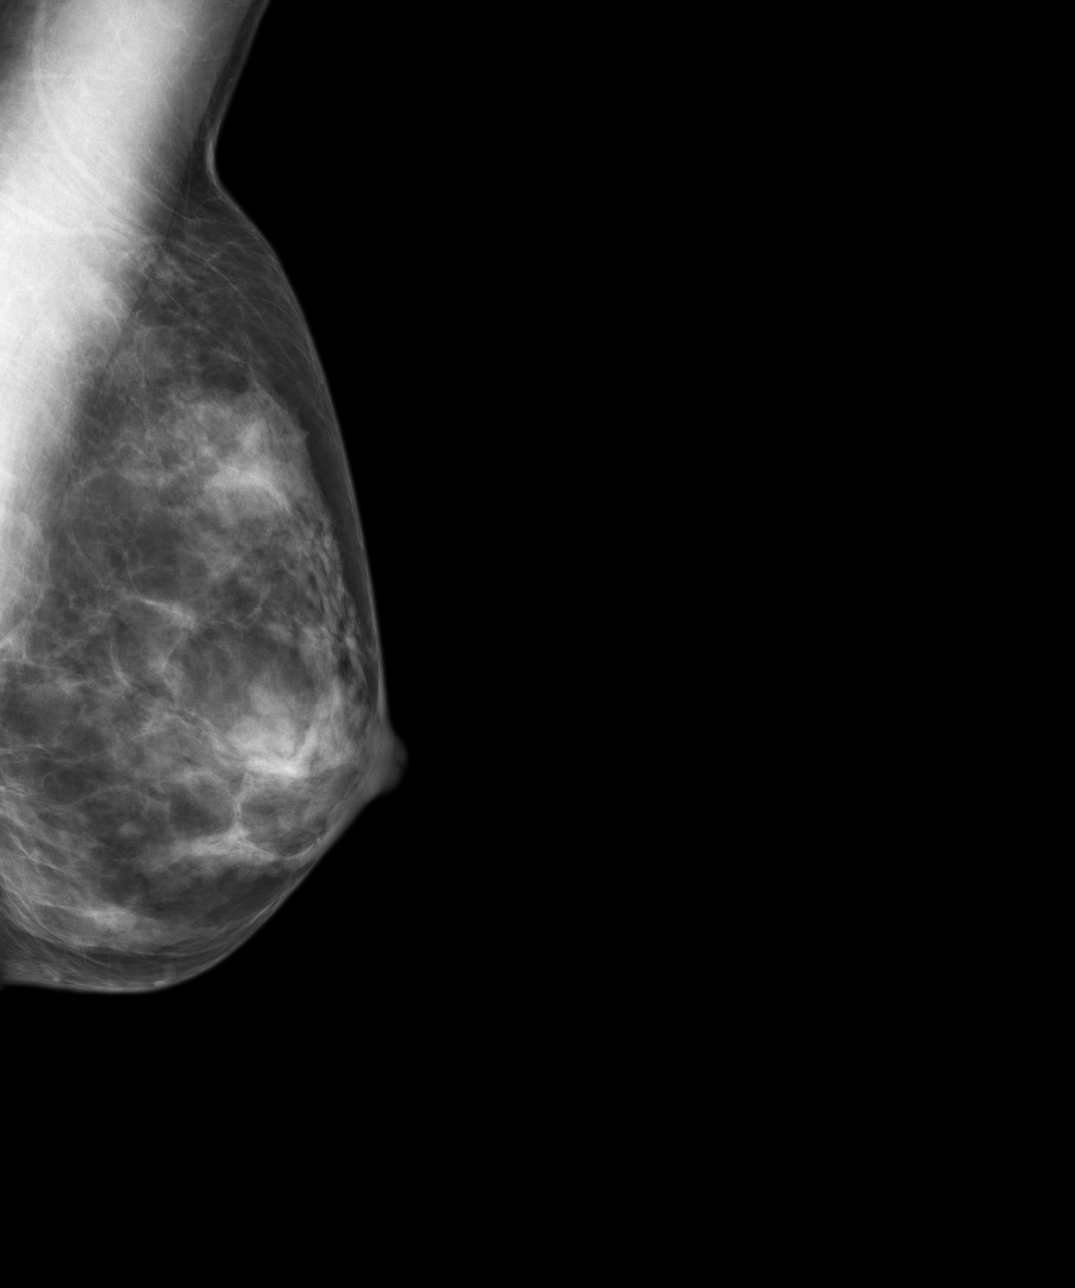

[4 of 4 positions shown; findings below may reference images not displayed]

PROCEDURE:     MAM - MAM DGTL SCRN MAM NO ORDER W/CAD  - [DATE]  [DATE]

RESULT:     Comparison is made to a previous digital study [DATE] as well as to a study [DATE], and [DATE].

The breasts exhibit a heterogeneously dense parenchymal pattern. There is no
dominant mass. There are no malignant appearing groupings of
microcalcification. No area of new architectural distortion is seen.
IMPRESSION: I do not see findings suspicious for malignancy.

BI-RADS 2 benign findings.

Recommendation: Please continue to encourage yearly mammographic followup.
A NEGATIVE MAMMOGRAM REPORT DOES NOT PRECLUDE BIOPSY OR OTHER EVALUATION OF
A CLINICALLY PALPABLE OR OTHERWISE SUSPICIOUS MASS OR LESION. BREAST CANCER
MAY NOT BE DETECTED BY MAMMOGRAPHY IN UP TO 10% OF CASES.

Dictation site:1

## 2013-04-01 ENCOUNTER — Ambulatory Visit: Payer: Self-pay | Admitting: Obstetrics and Gynecology

## 2013-04-01 IMAGING — US ULTRASOUND RIGHT BREAST
1 series · 14 of 16 positions shown · non-contrast
Comparison: none

REASON FOR EXAM: RT NIPPLE PAIN AND YRLY
COMMENTS:

PROCEDURE:     US  - US BREAST RIGHT  - [DATE]  [DATE]
RESULT:     Ultrasound right breast in region of clinical concern reveals no
focal abnormality. Mammography in this region is normal.

[Series 1: ultrasound right breast · 0.08mm/px · 14 of 16 slices shown]
[im 1/16]
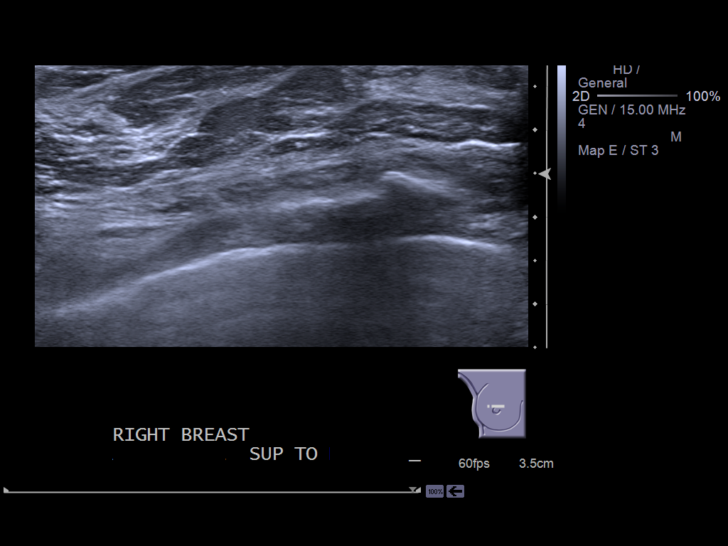
[im 2/16]
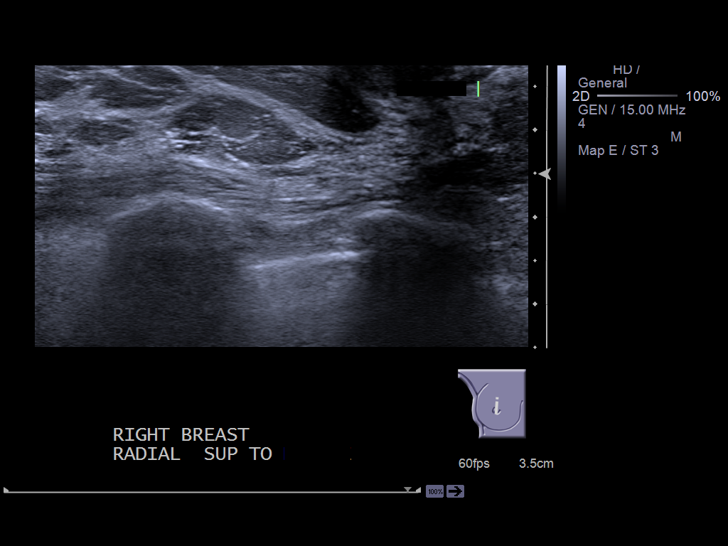
[im 3/16]
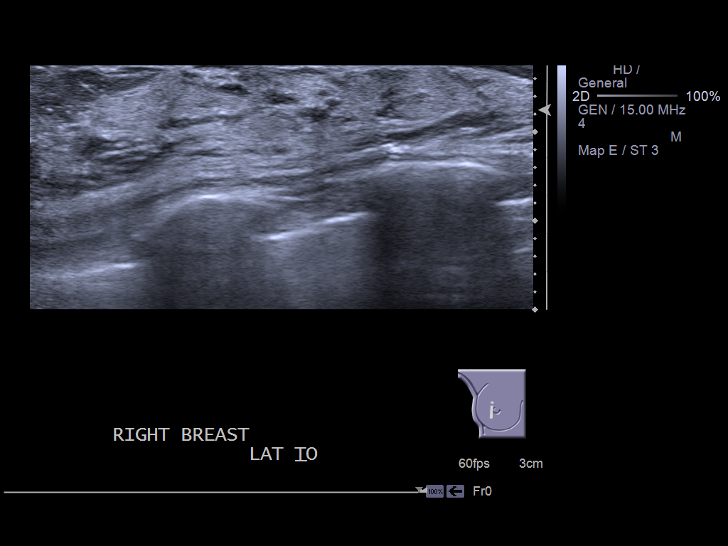
[im 5/16]
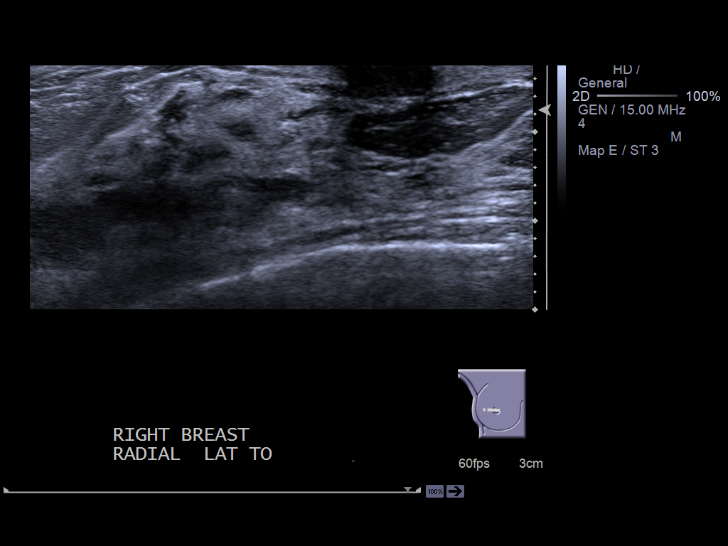
[im 6/16]
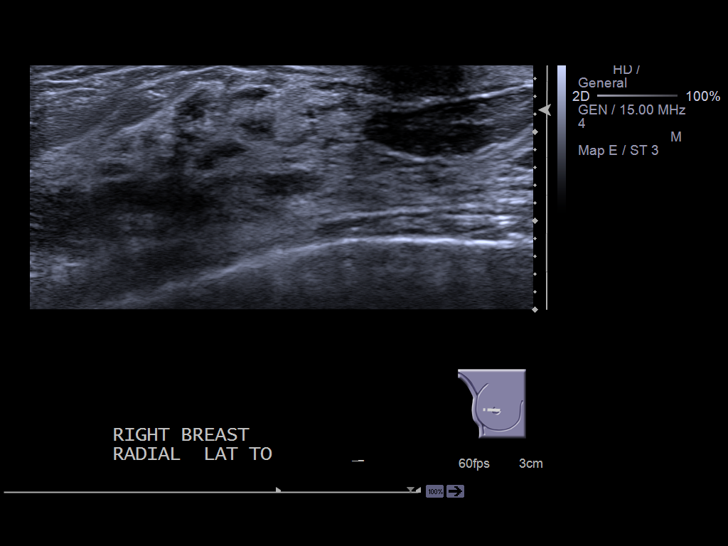
[im 7/16]
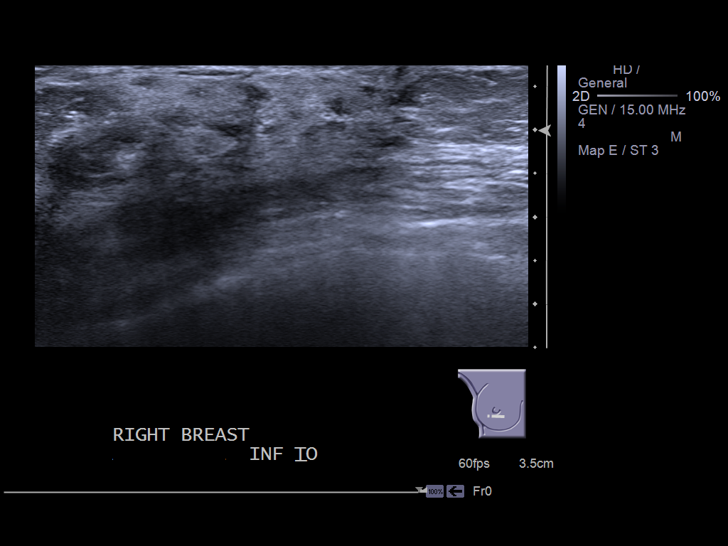
[im 8/16]
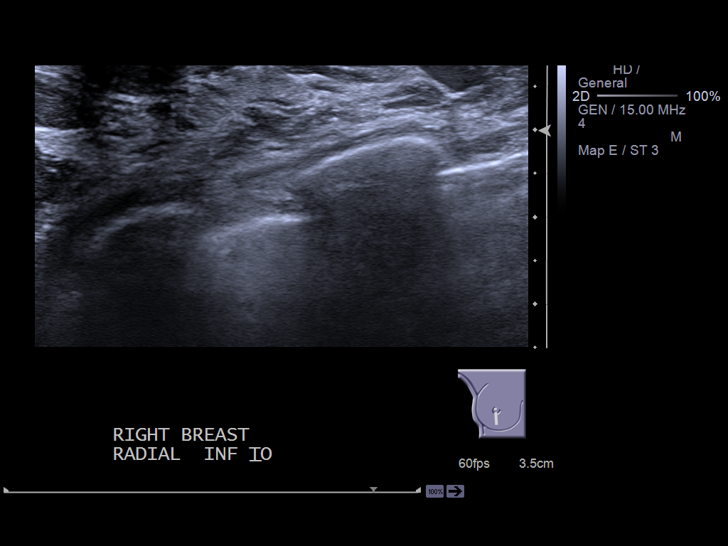
[im 9/16]
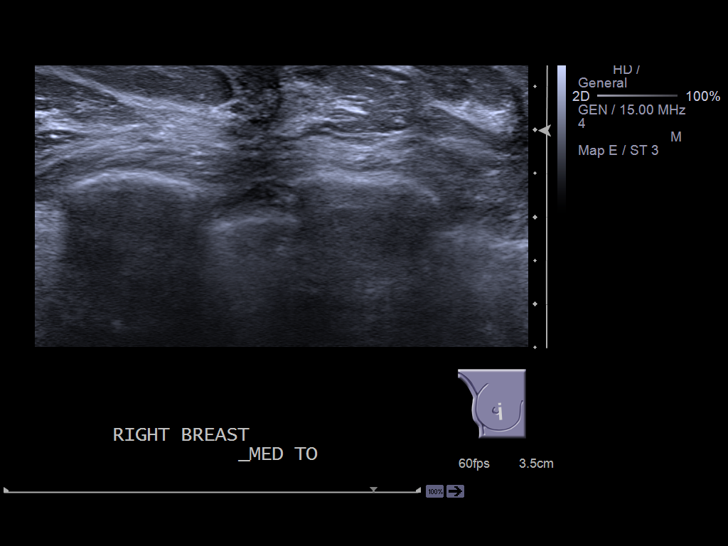
[im 10/16]
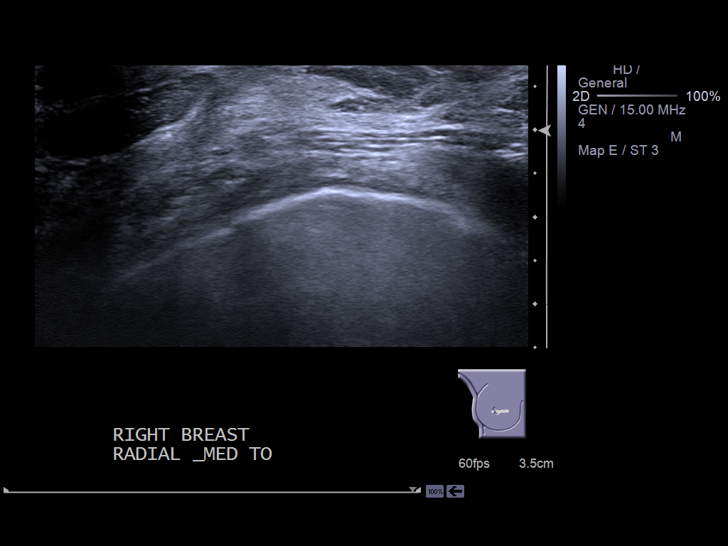
[im 11/16]
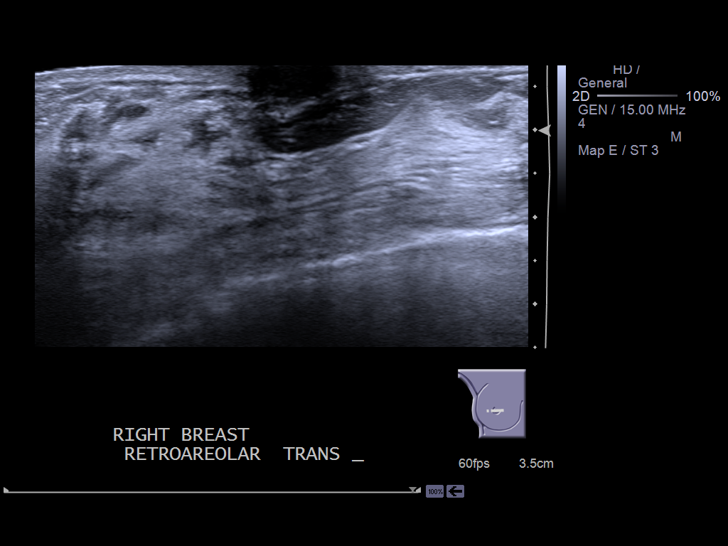
[im 13/16]
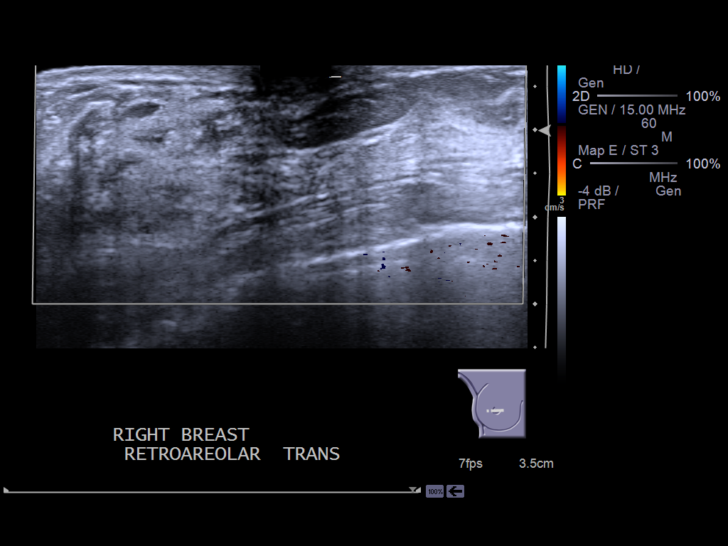
[im 14/16]
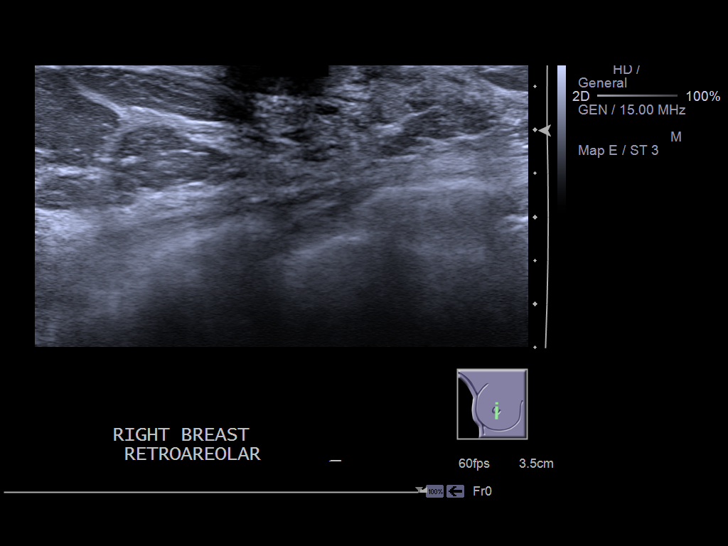
[im 15/16]
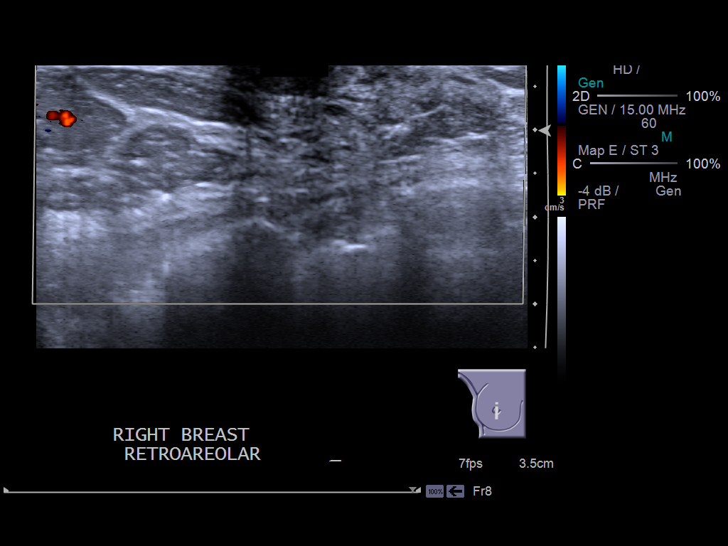
[im 16/16]
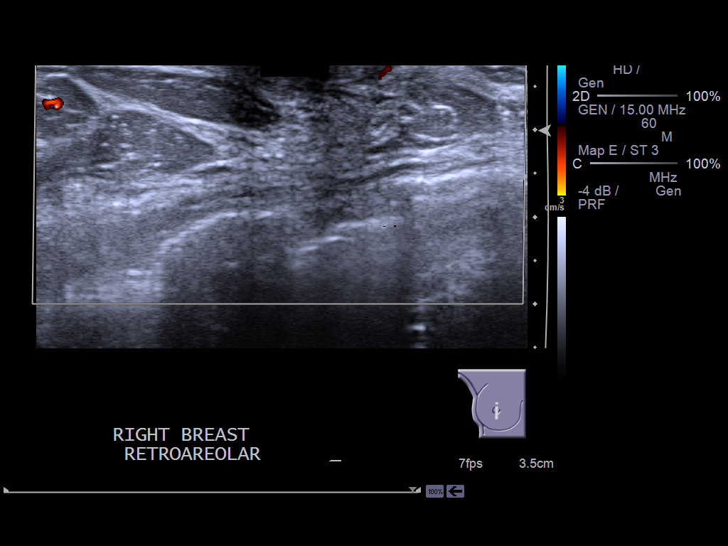

[14 of 16 positions shown; findings below may reference images not displayed]

IMPRESSION: Benign exam.

BI-RADS: Category 2- Benign Finding

A NEGATIVE MAMMOGRAM REPORT DOES NOT PRECLUDE BIOPSY OR OTHER EVALUATION OF
A CLINICALLY PALPABLE OR OTHERWISE SUSPICIOUS MASS OR LESION. BREAST CANCER
MAY NOT BE DETECTED IN UP TO 10% OF CASES.

## 2013-04-01 IMAGING — MG MAM DGTL DIAGNOSTIC MAMMO W/CAD
1 series · 8 of 8 positions shown · non-contrast
Comparison: none

REASON FOR EXAM: RT NIPPLE PAIN AND YRLY
COMMENTS:

[R CC · right · 8 of 9 slices shown]
[im 1/9]
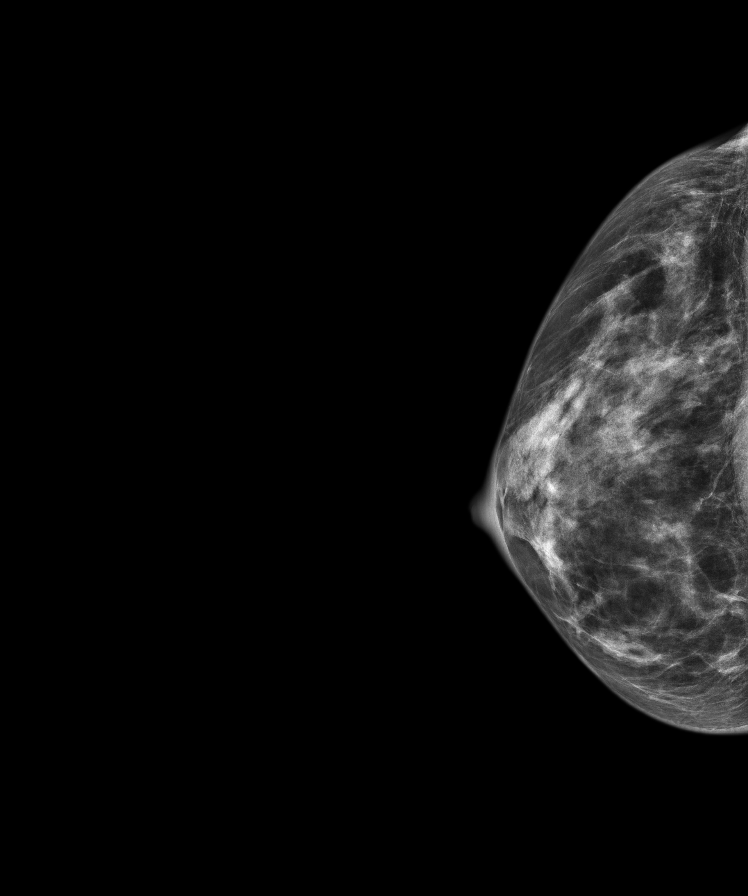
[im 2/9]
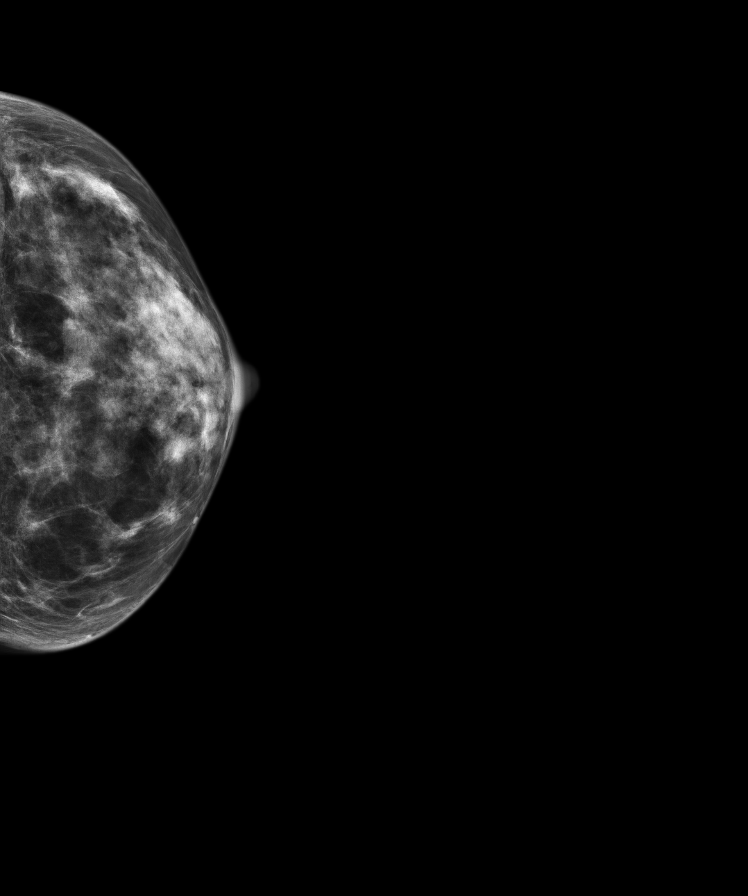
[im 3/9]
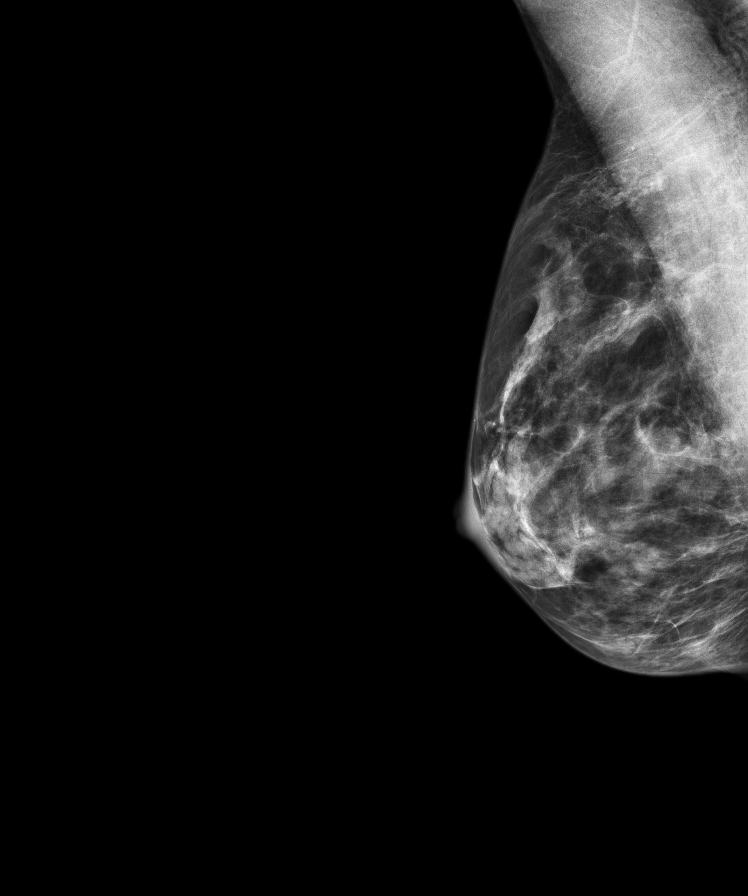
[im 4/9]
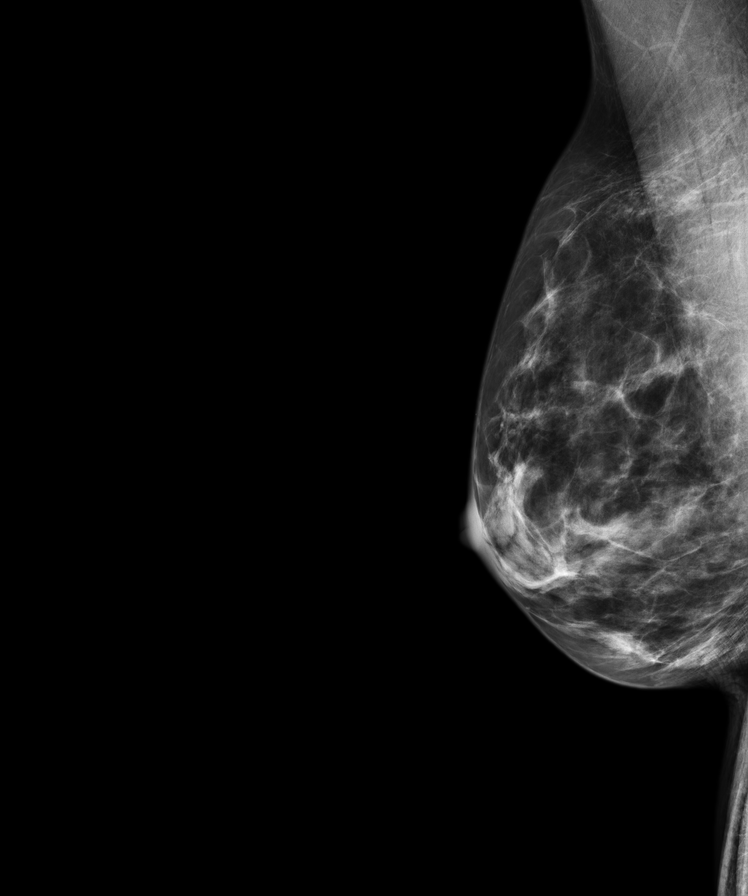
[im 5/9]
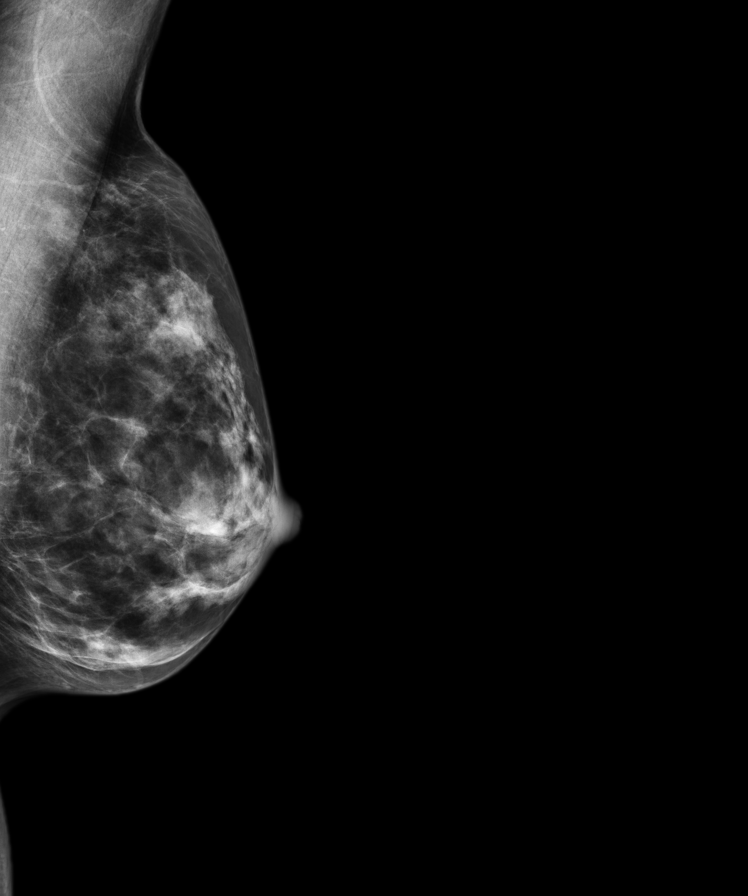
[im 6/9]
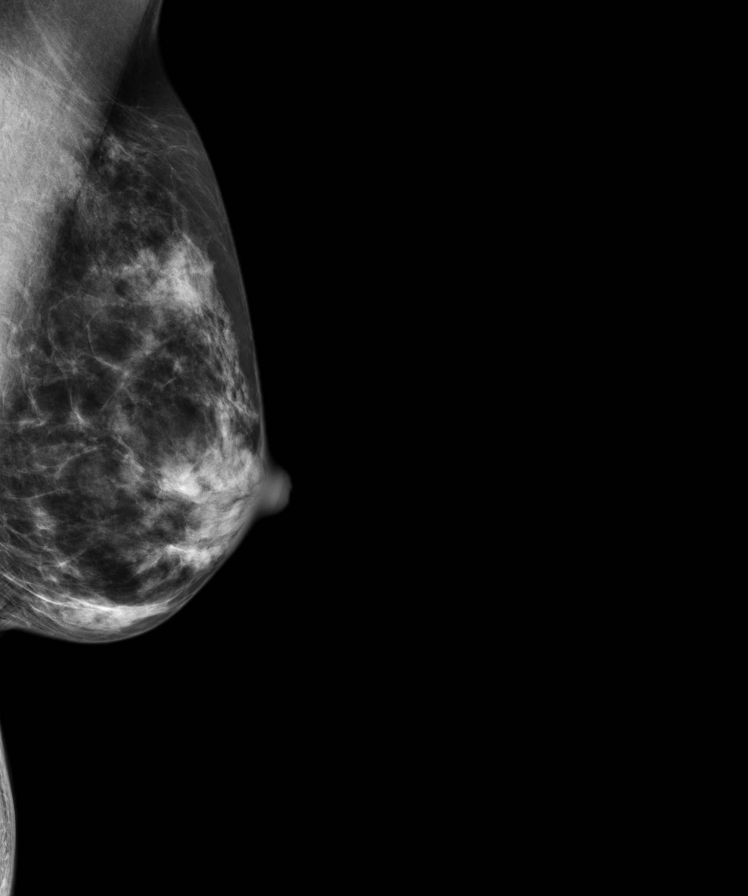
[im 7/9]
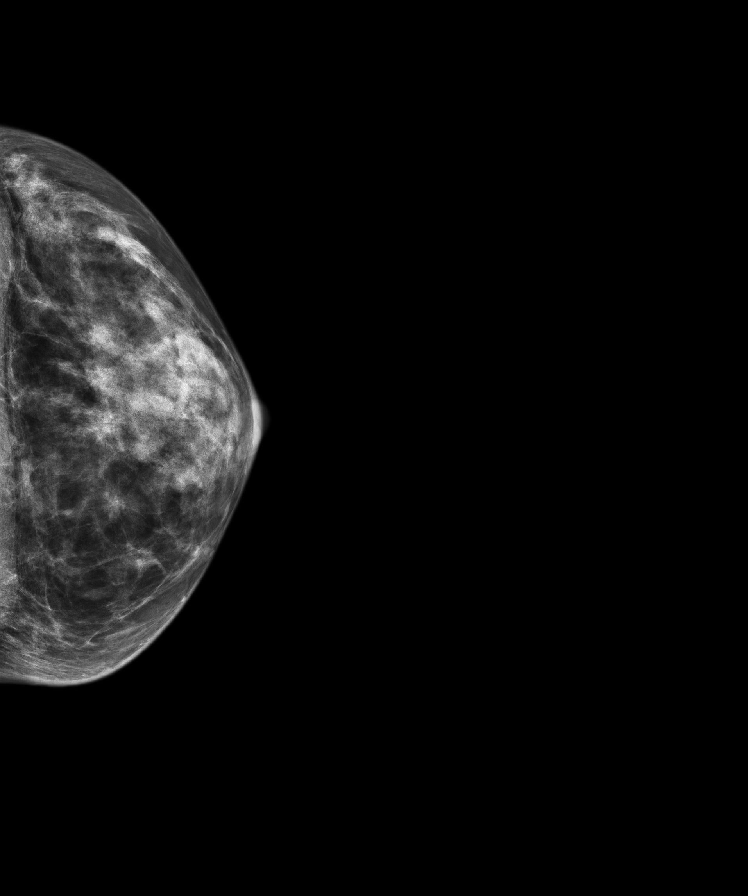
[im 9/9]
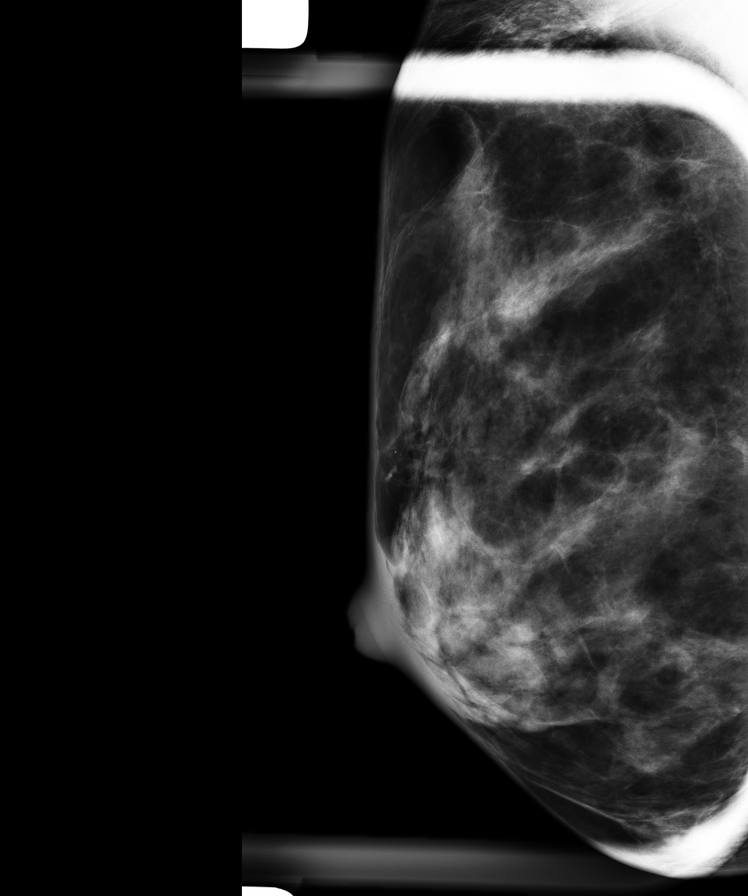

[8 of 8 positions shown; findings below may reference images not displayed]

PROCEDURE:     MAM - MAM DGTL DIAGNOSTIC MAMMO W/CAD  - [DATE]  [DATE]

RESULT:     Comparison made to multiple prior studies dating to [DATE].
Bilateral mammograms including compression spot studies of the right breast
reveal no focal abnormality. No mass or pathologic calcification. Ultrasound
of the right breast in region of clinical concern is unremarkable. CAD
evaluation is nonfocal.
IMPRESSION: Benign exam. Exam stable from multiple prior studies dated
[DATE].

BI-RADS: Category 2- Benign Finding

A NEGATIVE MAMMOGRAM REPORT DOES NOT PRECLUDE BIOPSY OR OTHER EVALUATION OF
A CLINICALLY PALPABLE OR OTHERWISE SUSPICIOUS MASS OR LESION. BREAST CANCER
MAY NOT BE DETECTED IN UP TO 10% OF CASES.

Thank you for the oppurtunity to contribute to the care of your patient. .

BREAST COMPOSITION: The breast composition is HETEROGENEOUSLY DENSE
(glandular tissue is 51-75%) This may decrease the sensitivity of
mammography.

## 2013-09-22 ENCOUNTER — Encounter (HOSPITAL_COMMUNITY)
Admission: EM | Disposition: A | Discharge status: Home / Self Care | Payer: Self-pay | Source: Home / Self Care | Attending: Surgery

## 2013-09-22 ENCOUNTER — Encounter (HOSPITAL_COMMUNITY): Payer: Self-pay | Admitting: Emergency Medicine

## 2013-09-22 ENCOUNTER — Emergency Department (HOSPITAL_COMMUNITY): Payer: BC Managed Care – PPO

## 2013-09-22 ENCOUNTER — Inpatient Hospital Stay (HOSPITAL_COMMUNITY)
Admission: EM | Admit: 2013-09-22 | Discharge: 2013-09-25 | DRG: 370 | Disposition: A | Discharge status: Home / Self Care | Payer: BC Managed Care – PPO | Attending: Surgery | Admitting: Surgery

## 2013-09-22 DIAGNOSIS — Z9851 Tubal ligation status: Secondary | ICD-10-CM

## 2013-09-22 DIAGNOSIS — Z79899 Other long term (current) drug therapy: Secondary | ICD-10-CM

## 2013-09-22 DIAGNOSIS — F329 Major depressive disorder, single episode, unspecified: Secondary | ICD-10-CM | POA: Diagnosis present

## 2013-09-22 DIAGNOSIS — T18108A Unspecified foreign body in esophagus causing other injury, initial encounter: Secondary | ICD-10-CM

## 2013-09-22 DIAGNOSIS — R1310 Dysphagia, unspecified: Secondary | ICD-10-CM | POA: Diagnosis present

## 2013-09-22 DIAGNOSIS — K226 Gastro-esophageal laceration-hemorrhage syndrome: Principal | ICD-10-CM | POA: Diagnosis present

## 2013-09-22 DIAGNOSIS — F3289 Other specified depressive episodes: Secondary | ICD-10-CM | POA: Diagnosis present

## 2013-09-22 HISTORY — DX: Major depressive disorder, single episode, unspecified: F32.9

## 2013-09-22 HISTORY — DX: Depression, unspecified: F32.A

## 2013-09-22 HISTORY — PX: ESOPHAGOGASTRODUODENOSCOPY: SHX5428

## 2013-09-22 LAB — CBC WITH DIFFERENTIAL/PLATELET
Basophils Absolute: 0 10*3/uL (ref 0.0–0.1)
Basophils Relative: 0 % (ref 0–1)
Eosinophils Absolute: 0.3 10*3/uL (ref 0.0–0.7)
Lymphocytes Relative: 21 % (ref 12–46)
MCH: 32.4 pg (ref 26.0–34.0)
MCHC: 33.7 g/dL (ref 30.0–36.0)
Monocytes Absolute: 0.7 10*3/uL (ref 0.1–1.0)
Neutro Abs: 6.2 10*3/uL (ref 1.7–7.7)
Neutrophils Relative %: 69 % (ref 43–77)
Platelets: 187 10*3/uL (ref 150–400)
RBC: 3.39 MIL/uL — ABNORMAL LOW (ref 3.87–5.11)
RDW: 13.1 % (ref 11.5–15.5)

## 2013-09-22 LAB — BASIC METABOLIC PANEL
Chloride: 108 mEq/L (ref 96–112)
Creatinine, Ser: 0.75 mg/dL (ref 0.50–1.10)
GFR calc Af Amer: >90 mL/min (ref >90)
Potassium: 3.5 mEq/L (ref 3.5–5.1)

## 2013-09-22 IMAGING — CR DG NECK SOFT TISSUE
1 series · 1 of 1 positions shown · non-contrast
Comparison: None.

CLINICAL DATA: Dysphagia. Patient states she feels a pill she took
last night stuck in her throat.

EXAM:
NECK SOFT TISSUES - 1+ VIEW

[w soft tissue neck lat]
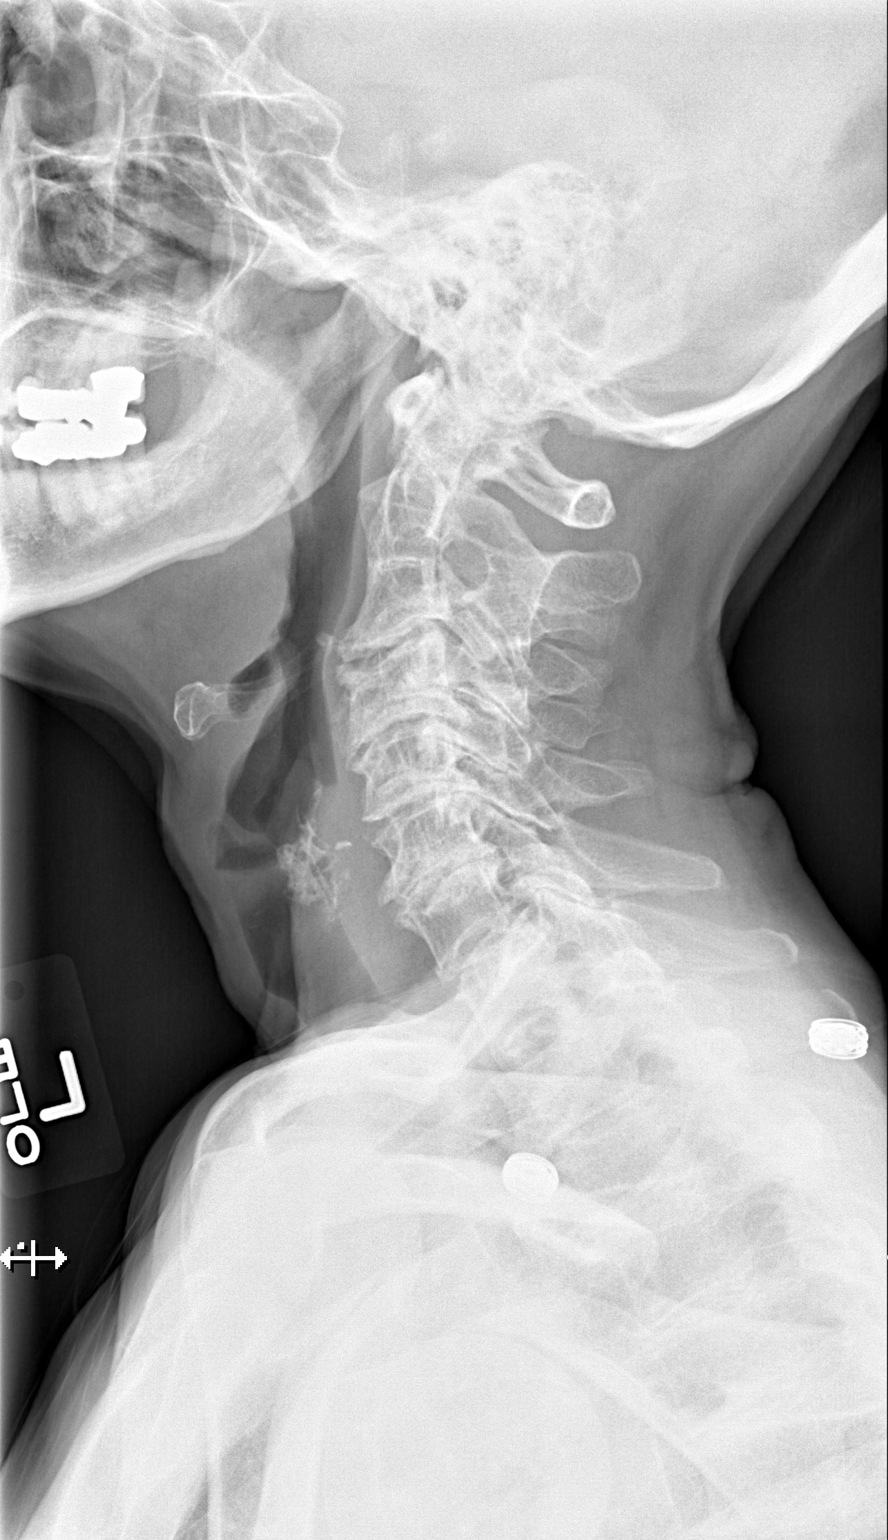

[1 of 1 positions shown; findings below may reference images not displayed]

FINDINGS: There is calcification that project within the posterior thyroid
cartilages and in the arytenoid cartilages. There is no convincing
radiopaque foreign body.

The airway is widely patent. The soft tissues are unremarkable.

There are degenerative changes throughout the visualized cervical
spine and a developmental fusion of C2 and C3.
IMPRESSION: No radiopaque foreign body.

## 2013-09-22 IMAGING — CT CT CHEST W/ CM
5 of 8 series · 17 of 36 positions shown, 18 images · IV contrast (APPLIED)
Comparison: Lateral cervical spine [DATE]

CLINICAL DATA: Dysphasia. Rule out swallowed foreign body.

EXAM:
CT NECK WITH CONTRAST; CT CHEST WITH CONTRAST
TECHNIQUE: Multidetector CT imaging of the neck was performed with intravenous
contrast.; Multidetector CT imaging of the chest was performed
following the standard protocol during bolus administration of
intravenous contrast.
CONTRAST:  100mL OMNIPAQUE IOHEXOL 300 MG/ML  SOLN

[Series 2: cap 5.0 i31f 1 · axial · 0.58mm/px · z∈[+334,+494]mm · 3 of 65 slices shown, 4 images]
[im 17/65  mediastinal]
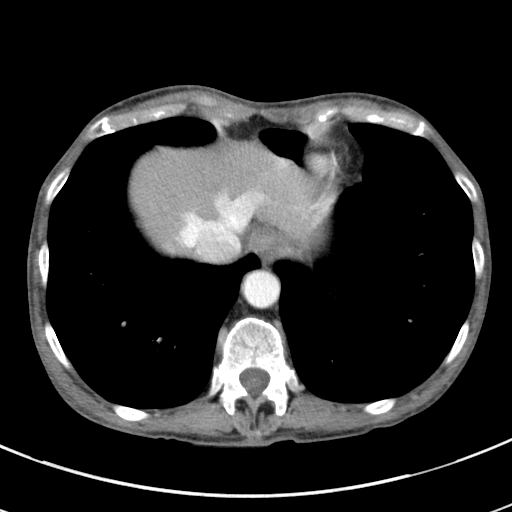
[im 17/65  lung]
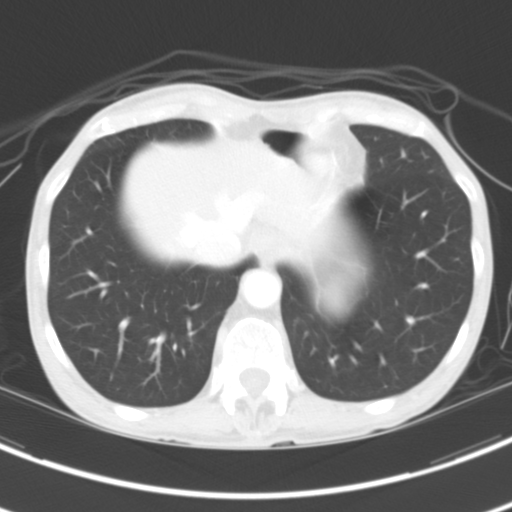
[im 33/65  lung]
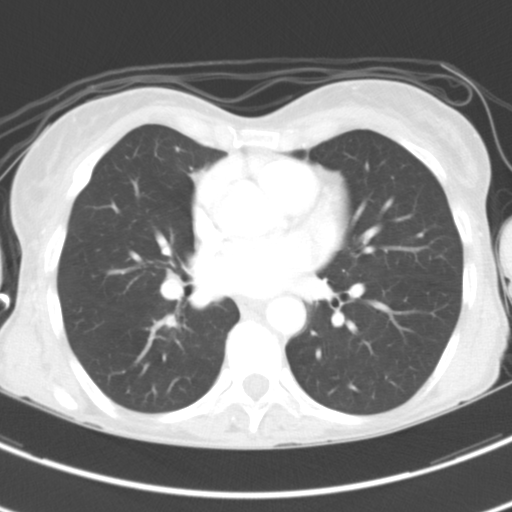
[im 49/65  lung]
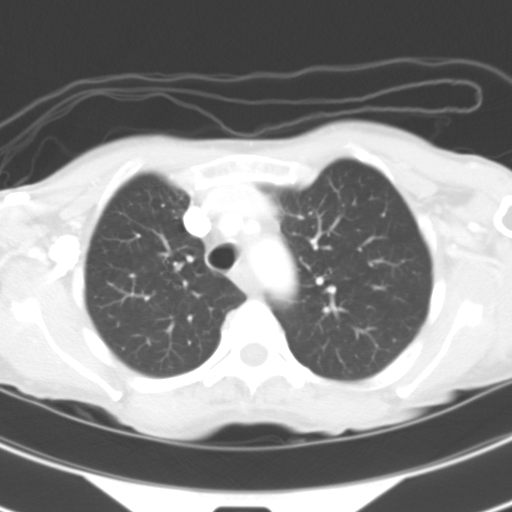

[Series 3: lungs · axial · 0.58mm/px · z∈[+340,+494]mm · 3 of 63 slices shown]
[im 16/63  lung]
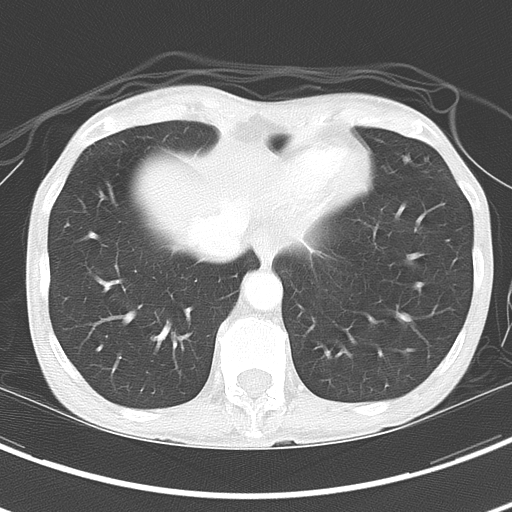
[im 32/63  lung]
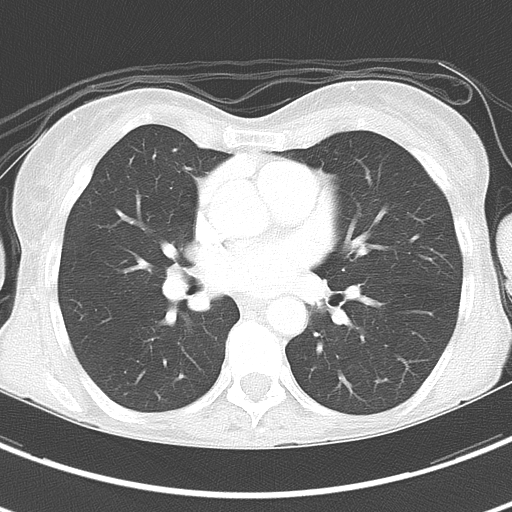
[im 47/63  lung]
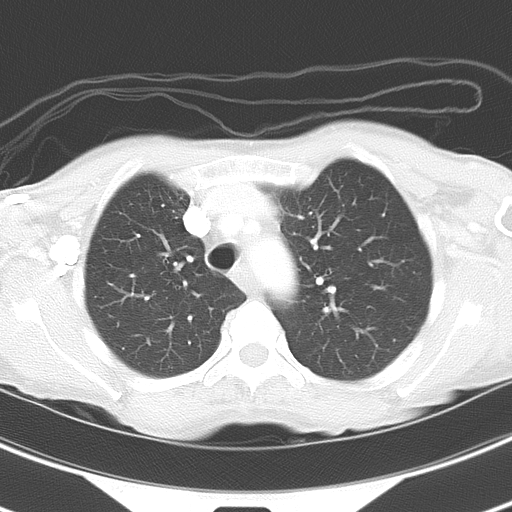

[Series 5: coronal · coronal · 0.62mm/px · 1 of 83 slices shown]
[im 42/83  lung]
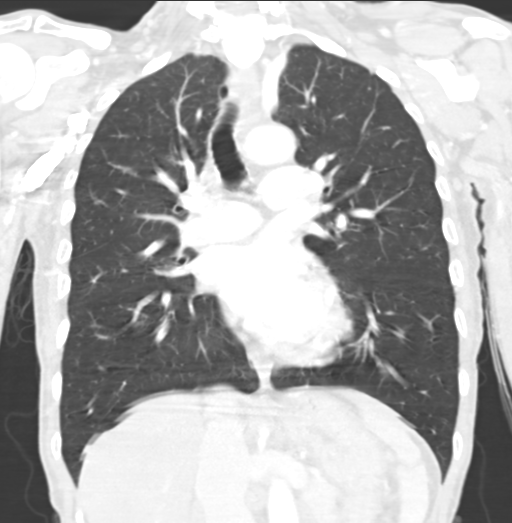

[Series 7: neck 2.0 i31s 3 · axial · 0.32mm/px · z∈[+528,+698]mm · 7 of 115 slices shown]
[im 15/115  lung]
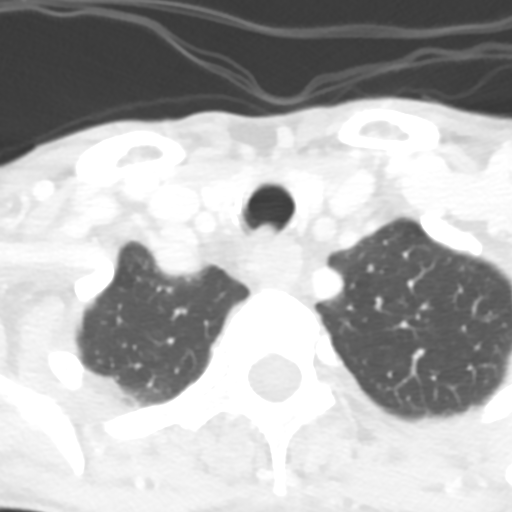
[im 29/115  lung]
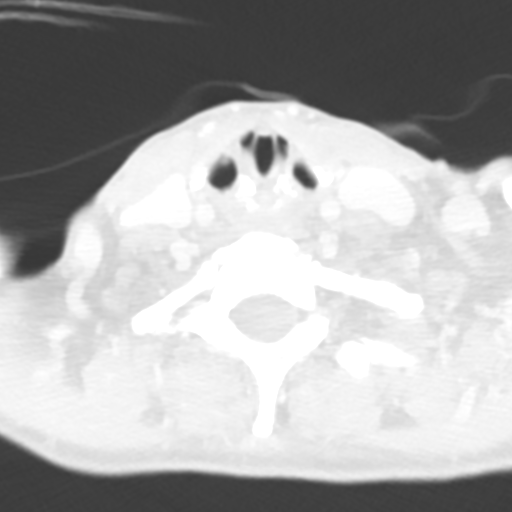
[im 43/115  lung]
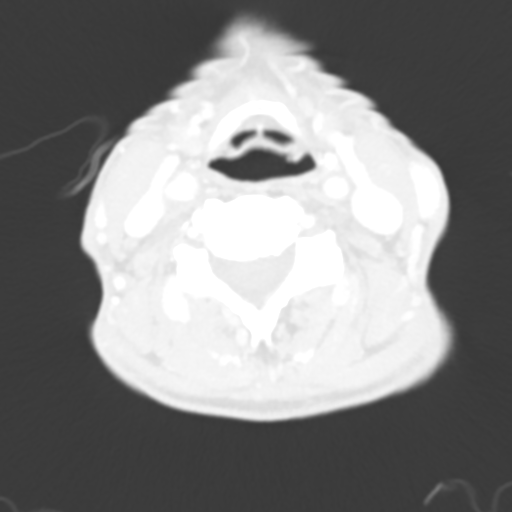
[im 58/115  lung]
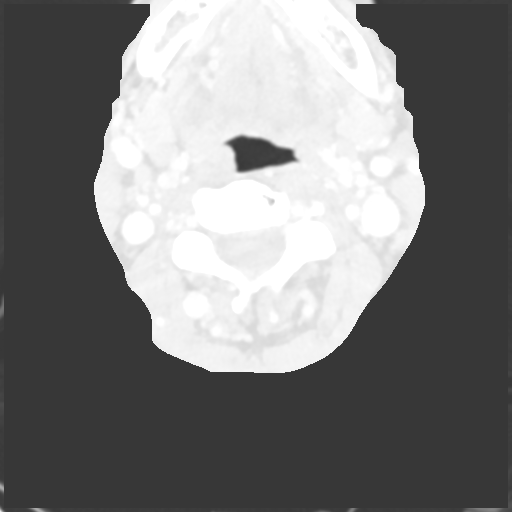
[im 72/115  lung]
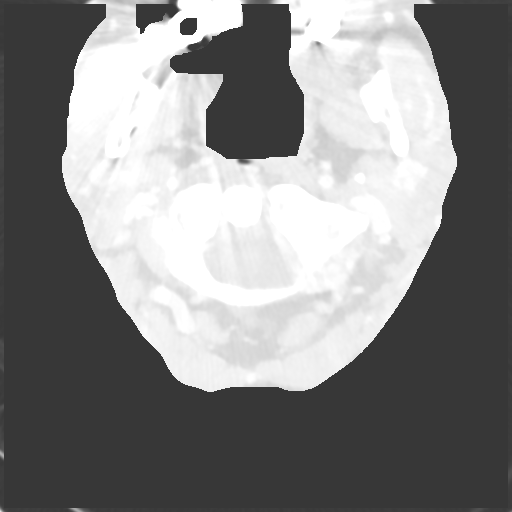
[im 86/115  lung]
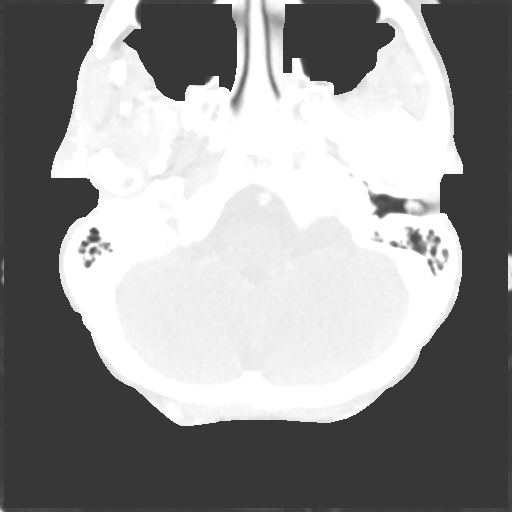
[im 100/115  lung]
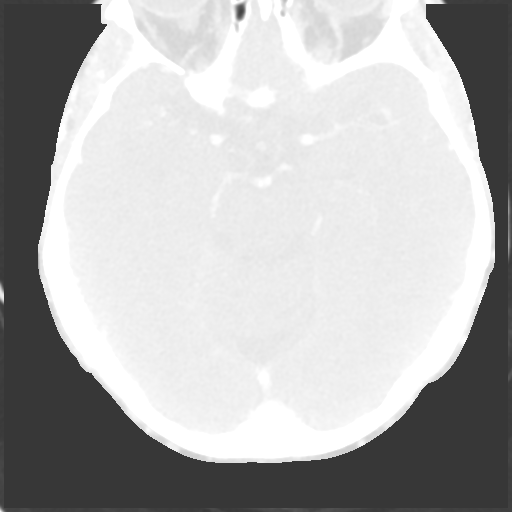

[Series 12: orthogonal st · axial · 0.35mm/px · z∈[+482,+535]mm · 3 of 114 slices shown]
[im 15/114  lung]
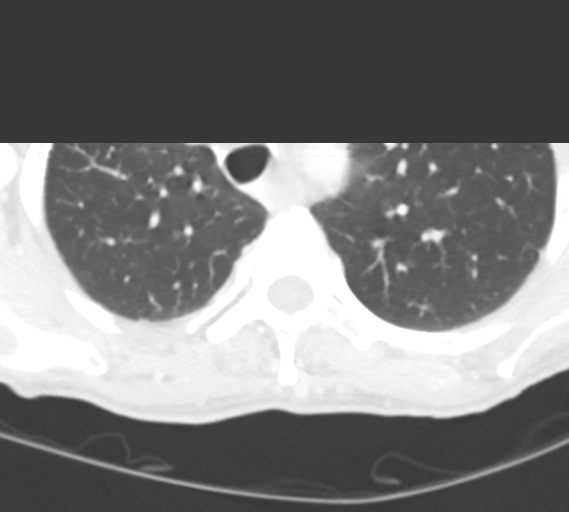
[im 29/114  lung]
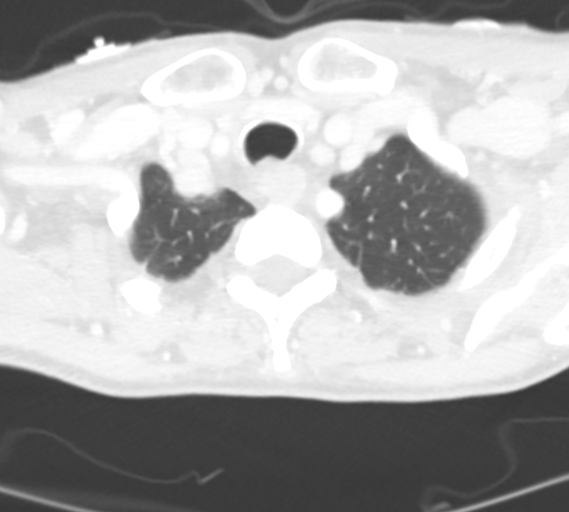
[im 43/114  lung]
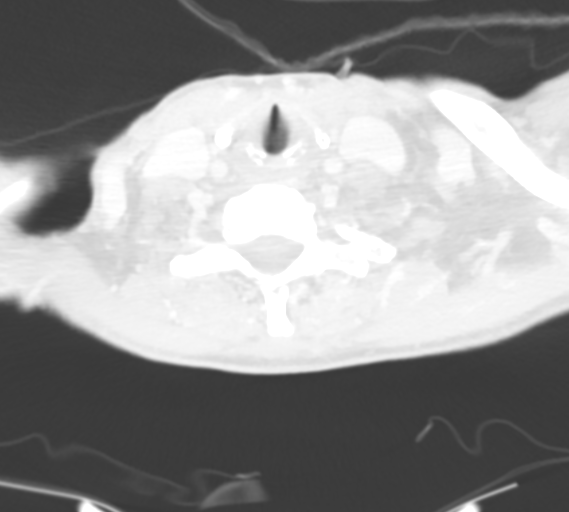

[17 of 36 positions shown; findings below may reference images not displayed]

FINDINGS: CT NECK FINDINGS

Asymmetric enlargement of the right tonsil. Mass lesion not
excluded. Direct visualization suggested to this area.

The tongue is normal. The epiglottis and larynx are normal. No
prevertebral soft tissue edema.

No abnormal gas in the soft tissues. No radiopaque foreign body is
identified.

Parotid and submandibular glands are normal. No cervical adenopathy
is present. Thyroid is normal.

Advanced cervical spondylosis and facet degeneration without focal
bony abnormality. Fusion of C2 and C3 is noted.

CT CHEST FINDINGS

Negative for esophageal foreign body. No mediastinal gas or fluid
collection is identified.

Heart size within normal limits. Lungs are clear without infiltrate
effusion. No mass or adenopathy.
IMPRESSION: CT NECK IMPRESSION

Negative for radiopaque foreign body.

Asymmetric enlargement of the right tonsil. Recommend ENT referral
and direct visualization to rule out a mass lesion. No adenopathy.

CT CHEST IMPRESSION

No acute abnormality in the chest. No retained foreign body is
identified.

## 2013-09-22 SURGERY — EGD (ESOPHAGOGASTRODUODENOSCOPY)
Anesthesia: Moderate Sedation

## 2013-09-22 MED ORDER — SODIUM CHLORIDE 0.9 % IV BOLUS (SEPSIS)
1000.0000 mL | Freq: Once | INTRAVENOUS | Status: AC
  Administered 2013-09-22: 1000 mL via INTRAVENOUS

## 2013-09-22 MED ORDER — SODIUM CHLORIDE 0.9 % IV SOLN
1000.0000 mL | INTRAVENOUS | Status: DC
  Administered 2013-09-22 – 2013-09-23 (×2): 1000 mL via INTRAVENOUS

## 2013-09-22 MED ORDER — MIDAZOLAM HCL 10 MG/2ML IJ SOLN
INTRAMUSCULAR | Status: DC | PRN
  Administered 2013-09-22: 1 mg via INTRAVENOUS
  Administered 2013-09-22: 2 mg via INTRAVENOUS

## 2013-09-22 MED ORDER — LORAZEPAM 2 MG/ML IJ SOLN
0.5000 mg | Freq: Once | INTRAMUSCULAR | Status: AC
  Administered 2013-09-22: 0.5 mg via INTRAVENOUS
  Filled 2013-09-22: qty 1

## 2013-09-22 MED ORDER — SODIUM CHLORIDE 0.9 % IV SOLN
INTRAVENOUS | Status: DC
  Administered 2013-09-22: 19:00:00 via INTRAVENOUS

## 2013-09-22 MED ORDER — FENTANYL CITRATE 0.05 MG/ML IJ SOLN
INTRAMUSCULAR | Status: DC | PRN
  Administered 2013-09-22: 25 ug via INTRAVENOUS

## 2013-09-22 MED ORDER — SODIUM CHLORIDE 0.9 % IV SOLN
1000.0000 mL | Freq: Once | INTRAVENOUS | Status: AC
  Administered 2013-09-22: 1000 mL via INTRAVENOUS

## 2013-09-22 MED ORDER — BUTAMBEN-TETRACAINE-BENZOCAINE 2-2-14 % EX AERO
INHALATION_SPRAY | CUTANEOUS | Status: DC | PRN
  Administered 2013-09-22: 2 via TOPICAL

## 2013-09-22 MED ORDER — DIPHENHYDRAMINE HCL 50 MG/ML IJ SOLN
INTRAMUSCULAR | Status: AC
  Filled 2013-09-22: qty 1

## 2013-09-22 MED ORDER — GLUCAGON HCL (RDNA) 1 MG IJ SOLR
2.0000 mg | Freq: Once | INTRAMUSCULAR | Status: AC
  Administered 2013-09-22: 2 mg via INTRAVENOUS
  Filled 2013-09-22 (×2): qty 2

## 2013-09-22 MED ORDER — MIDAZOLAM HCL 10 MG/2ML IJ SOLN
INTRAMUSCULAR | Status: AC
  Filled 2013-09-22: qty 2

## 2013-09-22 MED ORDER — FENTANYL CITRATE 0.05 MG/ML IJ SOLN
INTRAMUSCULAR | Status: AC
  Filled 2013-09-22: qty 2

## 2013-09-22 MED ORDER — IOHEXOL 300 MG/ML  SOLN
100.0000 mL | Freq: Once | INTRAMUSCULAR | Status: AC | PRN
  Administered 2013-09-22: 100 mL via INTRAVENOUS

## 2013-09-22 NOTE — ED Notes (Signed)
Pt waiting to be seen by GI

## 2013-09-22 NOTE — ED Notes (Signed)
Report called Melissa RN charge

## 2013-09-22 NOTE — ED Notes (Signed)
Consent was obtained in ENDO

## 2013-09-22 NOTE — Progress Notes (Signed)
Patient transported back to Emergency dept at 1815. [facility] will transport patient to [facility] for surgical consult due to esophageal tear/possible perforation.

## 2013-09-22 NOTE — ED Provider Notes (Signed)
The patient was seen earlier today for possible esophageal foreign body. During the procedure, the patient was noted to have an esophageal tear.  Dr. Bosie Clos contacted Dr. Lavinia Sharps. The patient is going to be transferred to [facility] for further evaluation including a CT scan of the chest as well as laboratory tests.  Patient is currently stable at the bedside. I have ordered IV fluids and we can initiate her laboratory testing here. It should not delay her transfer at all.  Celene Kras, MD 09/22/13 [phone]

## 2013-09-22 NOTE — ED Notes (Signed)
Pt to transport to ENDO.

## 2013-09-22 NOTE — ED Notes (Signed)
Pt arrived via [facility] from [facility]. Dr. Laneta Simmers, GI doc wanted pt transferred to [facility] for further evaluation and a CT of chest and abdomen performed. Pt had an endoscopy performed from [facility] and was found to have an esophageal tear. VSS upon arrival. 95/53, 65 heart rate, 98% on RA, and 16 respirations.

## 2013-09-22 NOTE — Op Note (Signed)
[facility] [address] [place], [address]   ENDOSCOPY PROCEDURE REPORT  PATIENT: [name], [name].  MR#: [id number] BIRTHDATE: 1951/11/15 , 62  yrs. old GENDER: Female  ENDOSCOPIST: Charlott Rakes, MD REFERRED BY:   hospital team  PROCEDURE DATE:  09/22/2013 PROCEDURE:   EGD w/ fb removal ASA CLASS:   Class II INDICATIONS:Foreign body removal from esophagus. MEDICATIONS: Fentanyl 25 mcg IV, Versed 3 mg IV, and Cetacaine spray x 2  TOPICAL ANESTHETIC:  DESCRIPTION OF PROCEDURE:   After the risks benefits and alternatives of the procedure were thoroughly explained, informed consent was obtained.  The Pentax Gastroscope [id number]  endoscope was introduced through the mouth and advanced to the esophagus lower , limited by Without limitations.   The instrument was slowly withdrawn as the mucosa was fully examined.     FINDINGS: The endoscope was inserted into the oropharynx and esophagus was intubated. Upon insertion into the proximal esophagus a distal tear was noted to develop it appeared during the patient retching. This tear was deep and noted in the distal esophagus extending to the GEJ. On the opposite wall there were the remnants of a pill adhered to the esophageal wall with fragments noted around it. Due to concern for an esophageal perforation, the procedure was terminated and the endoscope was not advanced beyond the proximal edge of this tear. A moderate amount of fresh blood was noted to come from this tear.  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 Laneta Simmers from thoracic  surgery; NPO; Transfer to [facility] hospital; IVFs; Antibiotics   REPEAT EXAM: N/A  _______________________________ Charlott Rakes, MD eSigned:  Charlott Rakes, MD 09/22/2013 6:08 PM    CC:

## 2013-09-22 NOTE — ED Notes (Signed)
[facility] called. 

## 2013-09-22 NOTE — H&P (Addendum)
  [facility] Gastroenterology Progress Note  Megan Cox 62 y.o. [birth year]   Subjective: Swallowed a generic Allegra pill last night and felt like it hung up and has not been able to tolerate liquids or food since then. Has been able to keep only minimal saliva down. Has occasional trouble with apple sauce with pills but denies any trouble swallowing liquids or food. Reports previous dilations in the past and thinks her last dilation was summer of 2012. Denies heartburn  Objective: Vital signs in last 24 hours: Filed Vitals:   09/22/13 1630  BP: 108/46  Pulse: 83  Temp: 98  Resp: 25    Physical Exam: Gen: alert, no acute distress, thin CV: RRR Chest: CTA  Abd: soft, nontender, nondistended, +BS Ext: no edema  Lab Results: No results found for this basename: NA, K, CL, CO2, GLUCOSE, BUN, CREATININE, CALCIUM, MG, PHOS,  in the last 72 hours No results found for this basename: AST, ALT, ALKPHOS, BILITOT, PROT, ALBUMIN,  in the last 72 hours No results found for this basename: WBC, NEUTROABS, HGB, HCT, MCV, PLT,  in the last 72 hours No results found for this basename: LABPROT, INR,  in the last 72 hours    Assessment/Plan: 62 yo with foreign body sensation from pill she swallowed in her esophagus that may be stuck. Needs EGD for removal of pill since glucagon not helpful. Will do balloon dilation if esophageal ring noted or stricture seen (less likely).   Megan Cox C. 09/22/2013, 4:46 PM

## 2013-09-22 NOTE — ED Notes (Signed)
Took allegra pill at 1140 last pm, pt states feels like still stuck in throat, denies trouble breathing, states can't swallow sip of water, no pain

## 2013-09-22 NOTE — Interval H&P Note (Signed)
History and Physical Interval Note:  09/22/2013 4:58 PM  Megan Cox  has presented today for surgery, with the diagnosis of food impaction  The various methods of treatment have been discussed with the patient and family. After consideration of risks, benefits and other options for treatment, the patient has consented to  Procedure(s): ESOPHAGOGASTRODUODENOSCOPY (EGD) (N/A) FOREIGN BODY REMOVAL (N/A) as a surgical intervention .  The patient's history has been reviewed, patient examined, no change in status, stable for surgery.  I have reviewed the patient's chart and labs.  Questions were answered to the patient's satisfaction.     Aziah Brostrom C.

## 2013-09-22 NOTE — ED Provider Notes (Signed)
CSN: [id number]     Arrival date & time 09/22/13  1134 History   First MD Initiated Contact with Patient 09/22/13 1152     Chief Complaint  Patient presents with  . Dysphagia  . Foreign Body   (Consider location/radiation/quality/duration/timing/severity/associated sxs/prior Treatment) HPI  Pt transferred from [facility] to [facility] by ER as recommended by Dr. Franco Collet and Dr. Bosie Clos. Pt had Allegra pill stuck in her esophagus since last night and went to the ER this morning to be evaluated. She had an endoscopy done and at that time a proximal tear was noted. It is unsure if it was already present or caused by the procedure. The procedure was discontinued, It was requested that she be sent to [facility] for ct chest and neck w contrast for further evaluation. Currently the patient is stable and is able to answer my questions. She also endorses understanding what is going on and what the plan is.   Past Medical History  Diagnosis Date  . Depression    Past Surgical History  Procedure Laterality Date  . Tubal ligation    . Ovarian cyst removal  1973  . Esophagogastroduodenoscopy  2012   History reviewed. No pertinent family history. History  Substance Use Topics  . Smoking status: Never Smoker   . Smokeless tobacco: Not on file  . Alcohol Use: No   OB History   Grav Para Term Preterm Abortions TAB SAB Ect Mult Living                 Review of Systems  All other systems reviewed and are negative.    Allergies  Review of patient's allergies indicates no known allergies.  Home Medications   Current Outpatient Rx  Name  Route  Sig  Dispense  Refill  . ALPRAZolam (XANAX) 0.25 MG tablet   Oral   Take 0.25 mg by mouth at bedtime as needed for anxiety.         . diphenhydrAMINE (BENADRYL) 25 MG tablet   Oral   Take 25 mg by mouth every 6 (six) hours as needed for itching.         . fexofenadine (ALLEGRA) 180 MG tablet   Oral   Take 180 mg by mouth daily.          Marland Kitchen glucosamine-chondroitin 500-400 MG tablet   Oral   Take 1 tablet by mouth 2 (two) times daily. Cuts in half         . Multiple Vitamin (MULTIVITAMIN WITH MINERALS) TABS tablet   Oral   Take 1 tablet by mouth daily.         . Soy Isoflavone 40 MG TABS   Oral   Take 2 tablets by mouth daily.         . traZODone (DESYREL) 50 MG tablet   Oral   Take 1 tablet by mouth 3 (three) times daily.          BP 106/51  Pulse 76  Temp(Src) 98.4 F (36.9 C) (Oral)  Resp 16  Ht 5\' 3"  (1.6 m)  Wt 100 lb (45.36 kg)  BMI 17.72 kg/m2  SpO2 100% Physical Exam  Nursing note and vitals reviewed. Constitutional: She appears well-developed and well-nourished. No distress.  HENT:  Head: Normocephalic and atraumatic.  Eyes: Pupils are equal, round, and reactive to light.  Neck: Normal range of motion. Neck supple.  Cardiovascular: Normal rate and regular rhythm.   Pulmonary/Chest: Effort normal.  Abdominal: Soft.  Neurological:  She is alert.  Skin: Skin is warm and dry.    ED Course  Procedures (including critical care time) Labs Review Labs Reviewed  CBC WITH DIFFERENTIAL - Abnormal; Notable for the following:    RBC 3.39 (*)    Hemoglobin 11.0 (*)    HCT 32.6 (*)    All other components within normal limits  BASIC METABOLIC PANEL - Abnormal; Notable for the following:    Calcium 8.2 (*)    GFR calc non Af Amer 89 (*)    All other components within normal limits   Imaging Review Dg Neck Soft Tissue  09/22/2013   CLINICAL DATA:  Dysphagia. Patient states she feels a pill she took last night stuck in her throat.  EXAM: NECK SOFT TISSUES - 1+ VIEW  COMPARISON:  None.  FINDINGS: There is calcification that project within the posterior thyroid cartilages and in the arytenoid cartilages. There is no convincing radiopaque foreign body.  The airway is widely patent. The soft tissues are unremarkable.  There are degenerative changes throughout the visualized cervical spine and a  developmental fusion of C2 and C3.  IMPRESSION: No radiopaque foreign body.   Electronically Signed   By: Amie Portland M.D.   On: 09/22/2013 12:11    EKG Interpretation   None       MDM   1. Esophageal foreign body, initial encounter    Author: Shirley Friar, MD Service: Gastroenterology Author Type: Physician   Filed: 09/22/2013 6:09 PM Note Time: 09/22/2013 5:21 PM     Related Notes: Original Note by Shirley Friar, MD filed at 09/22/2013 5:42 PM       During intubation of the proximal esophagus a perforation was noted to develop in the distal esophagus during her retching. Pill fragments seen on an adjacent wall of the distal esophagus at the GEJ. The EGD was terminated and the endoscope was removed and not advanced distal to the GEJ. Will call the thoracic surgeon, discussed with Dr. Laneta Simmers. Have discussed with pt and Dr. Lynelle Doctor and patient likely will needs a chest and neck CT and labs done. Dr. Laneta Simmers requesting patient be transferred to [facility] for further evaluation. Needs neck and chest CT with contrast to look for a leak also will need a gastrograffin study. Will need complete labs done. ER doctor at [facility] needs to call Dr. Laneta Simmers when studies have been completed. Following the procedure patient felt less pressure in her neck and was in no acute distress. No vomiting noted in recovery area.     Discussed case with Dr. Garth Schlatter, will order CT scan of neck and chest. I reviewed Dr. Ardelle Lesches note and Dr. Venia Minks note for evaluation on which imaging to order. It was not clear because of conflicting suggestions. Therefore, I called radiology to confirm which test to order. He said CT chest/neck with contrast was best.  Pt went to CT and came back showing no perforation, they however did NOT give oral contrast. I called CT to inquire about this and they said that when she got to CT she refused oral contrast but I was not notified about this. Dr. Laneta Simmers says that  patient needs CT chest with gastrogaffin to be able to assess the severity of her esophageal tear. Discussed with Dr. Fredderick Phenix and patient, will send pt back to CT scan.  End of shift patient handed off to Dr. Dierdre Highman for f/u of scan then call to CT surgery.   Dorthula Matas,  PA-C 09/23/13 0136

## 2013-09-22 NOTE — ED Provider Notes (Signed)
CSN: [id number]     Arrival date & time 09/22/13  1134 History   First MD Initiated Contact with Patient 09/22/13 1152     Chief Complaint  Patient presents with  . Dysphagia  . Foreign Body   (Consider location/radiation/quality/duration/timing/severity/associated sxs/prior Treatment) HPI  62 year old female with esophageal foreign body sensation. Onset shortly before midnight last night and when swallowing a pill. Persistent since then. No respiratory complaints. She states that she's been unable to eat or drink anything. Frequent spitting. No chest or abdominal discomfort. Past history of esophageal stricture requiring dilation previously by Dr. Matthias Hughs.    History reviewed. No pertinent past medical history. History reviewed. No pertinent past surgical history. History reviewed. No pertinent family history. History  Substance Use Topics  . Smoking status: Never Smoker   . Smokeless tobacco: Not on file  . Alcohol Use: No   OB History   Grav Para Term Preterm Abortions TAB SAB Ect Mult Living                 Review of Systems  All systems reviewed and negative, other than as noted in HPI.   Allergies  Review of patient's allergies indicates no known allergies.  Home Medications   Current Outpatient Rx  Name  Route  Sig  Dispense  Refill  . ALPRAZolam (XANAX) 0.25 MG tablet   Oral   Take 0.25 mg by mouth at bedtime as needed for anxiety.         . diphenhydrAMINE (BENADRYL) 25 MG tablet   Oral   Take 25 mg by mouth every 6 (six) hours as needed for itching.         . fexofenadine (ALLEGRA) 180 MG tablet   Oral   Take 180 mg by mouth daily.         Marland Kitchen glucosamine-chondroitin 500-400 MG tablet   Oral   Take 1 tablet by mouth 2 (two) times daily. Cuts in half         . Multiple Vitamin (MULTIVITAMIN WITH MINERALS) TABS tablet   Oral   Take 1 tablet by mouth daily.         . Soy Isoflavone 40 MG TABS   Oral   Take 2 tablets by mouth daily.          . traZODone (DESYREL) 50 MG tablet   Oral   Take 1 tablet by mouth 3 (three) times daily.          BP 106/58  Pulse 56  Temp(Src) 98.8 F (37.1 C) (Oral)  Resp 16  SpO2 100% Physical Exam  Nursing note and vitals reviewed. Constitutional: She appears well-developed and well-nourished. No distress.  Sitting up in bed spitting into a cup. No acute distress.  HENT:  Head: Normocephalic and atraumatic.  Eyes: Conjunctivae are normal. Right eye exhibits no discharge. Left eye exhibits no discharge.  Neck: Normal range of motion. Neck supple.  Cardiovascular: Normal rate, regular rhythm and normal heart sounds.  Exam reveals no gallop and no friction rub.   No murmur heard. Pulmonary/Chest: Effort normal and breath sounds normal. No stridor. No respiratory distress.  Breath sounds clear. No increased work of breathing. No stridor.  Abdominal: Soft. She exhibits no distension. There is no tenderness.  Musculoskeletal: She exhibits no edema and no tenderness.  Neurological: She is alert.  Skin: Skin is warm and dry.  Psychiatric: She has a normal mood and affect. Her behavior is normal. Thought content normal.  ED Course  Procedures (including critical care time) Labs Review Labs Reviewed - No data to display Imaging Review Dg Neck Soft Tissue  09/22/2013   CLINICAL DATA:  Dysphagia. Patient states she feels a pill she took last night stuck in her throat.  EXAM: NECK SOFT TISSUES - 1+ VIEW  COMPARISON:  None.  FINDINGS: There is calcification that project within the posterior thyroid cartilages and in the arytenoid cartilages. There is no convincing radiopaque foreign body.  The airway is widely patent. The soft tissues are unremarkable.  There are degenerative changes throughout the visualized cervical spine and a developmental fusion of C2 and C3.  IMPRESSION: No radiopaque foreign body.   Electronically Signed   By: Amie Portland M.D.   On: 09/22/2013 12:11    EKG  Interpretation   None       MDM   1. Esophageal foreign body, initial encounter     62 year old female with likely esophageal foreign body. Patient with a past history of esophageal stricture. Discussed case with GI. Plan scheduled later this afternoon.  1:01 PM Discussed with GI. May be a few hours before can do scope. Updated pt.   Raeford Razor, MD 09/22/13 1540

## 2013-09-22 NOTE — ED Notes (Signed)
Patient transported to CT 

## 2013-09-22 NOTE — ED Notes (Signed)
Bed: WA03 Expected date:  Expected time:  Means of arrival:  Comments: Pt in ENDO

## 2013-09-22 NOTE — ED Notes (Signed)
Pt stating that glucagon didn't help. Still feels as if something is stuck in throat. Pt O2 Sat 98% RA. AAO x4. NAD at this time.

## 2013-09-22 NOTE — Brief Op Note (Addendum)
During intubation of the proximal esophagus a perforation was noted to develop in the distal esophagus during her retching. Pill fragments seen on an adjacent wall of the distal esophagus at the GEJ. The EGD was terminated and the endoscope was removed and not advanced distal to the GEJ. Will call the thoracic surgeon, discussed with Dr. Laneta Simmers. Have discussed with pt and Dr. Lynelle Doctor and patient likely will needs a chest and neck CT and labs done. Dr. Laneta Simmers requesting patient be transferred to [facility] for further evaluation. Needs neck and chest CT with contrast to look for a leak also will need a gastrograffin study.  Will need complete labs done. ER doctor at [facility] needs to call Dr. Laneta Simmers when studies have been completed. Following the procedure patient felt less pressure in her neck and was in no acute distress. No vomiting noted in recovery area.

## 2013-09-22 NOTE — ED Notes (Signed)
Bed: WA01 Expected date:  Expected time:  Means of arrival:  Comments: 

## 2013-09-23 ENCOUNTER — Encounter (HOSPITAL_COMMUNITY): Payer: Self-pay | Admitting: Radiology

## 2013-09-23 ENCOUNTER — Emergency Department (HOSPITAL_COMMUNITY): Payer: BC Managed Care – PPO

## 2013-09-23 DIAGNOSIS — K226 Gastro-esophageal laceration-hemorrhage syndrome: Secondary | ICD-10-CM

## 2013-09-23 LAB — CBC WITH DIFFERENTIAL/PLATELET
Basophils Relative: 0 % (ref 0–1)
Eosinophils Absolute: 0.5 10*3/uL (ref 0.0–0.7)
Eosinophils Relative: 6 % — ABNORMAL HIGH (ref 0–5)
HCT: 30.1 % — ABNORMAL LOW (ref 36.0–46.0)
Hemoglobin: 10.3 g/dL — ABNORMAL LOW (ref 12.0–15.0)
MCH: 32.8 pg (ref 26.0–34.0)
MCHC: 34.2 g/dL (ref 30.0–36.0)
MCV: 95.9 fL (ref 78.0–100.0)
Monocytes Absolute: 0.6 10*3/uL (ref 0.1–1.0)
Monocytes Relative: 7 % (ref 3–12)
Neutro Abs: 4.8 10*3/uL (ref 1.7–7.7)
RBC: 3.14 MIL/uL — ABNORMAL LOW (ref 3.87–5.11)

## 2013-09-23 LAB — COMPREHENSIVE METABOLIC PANEL
ALT: 15 U/L (ref 0–35)
AST: 21 U/L (ref 0–37)
Albumin: 3.1 g/dL — ABNORMAL LOW (ref 3.5–5.2)
BUN: 11 mg/dL (ref 6–23)
Calcium: 7.8 mg/dL — ABNORMAL LOW (ref 8.4–10.5)
GFR calc Af Amer: >90 mL/min (ref >90)
Potassium: 3 mEq/L — ABNORMAL LOW (ref 3.5–5.1)
Sodium: 143 mEq/L (ref 135–145)
Total Protein: 5.6 g/dL — ABNORMAL LOW (ref 6.0–8.3)

## 2013-09-23 LAB — LIPASE, BLOOD: Lipase: 26 U/L (ref 11–59)

## 2013-09-23 IMAGING — CT CT CHEST W/O CM
2 of 3 series · 15 of 36 positions shown, 18 images · non-contrast
Comparison: [DATE]

CLINICAL DATA: Esophageal tear in the distal esophagus noted at
endoscopy. Transfer patient from [HOSPITAL].

EXAM:
CT CHEST WITHOUT CONTRAST
TECHNIQUE: Multidetector CT imaging of the chest was performed following the
standard protocol without IV contrast.

[Series 2: chest without · axial · non-contrast · 0.68mm/px · z∈[+94,+364]mm · 12 of 64 slices shown, 15 images]
[im 5/64  mediastinal]
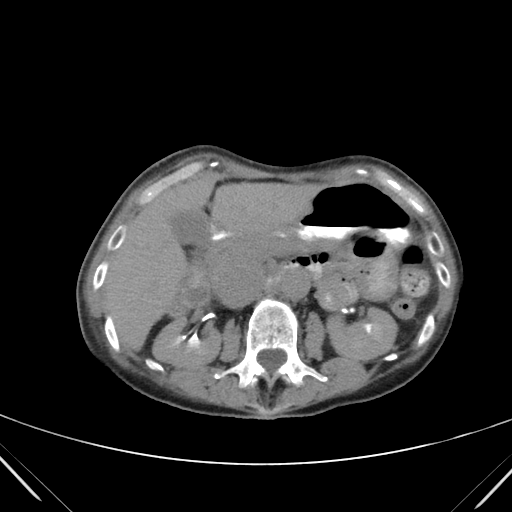
[im 5/64  lung]
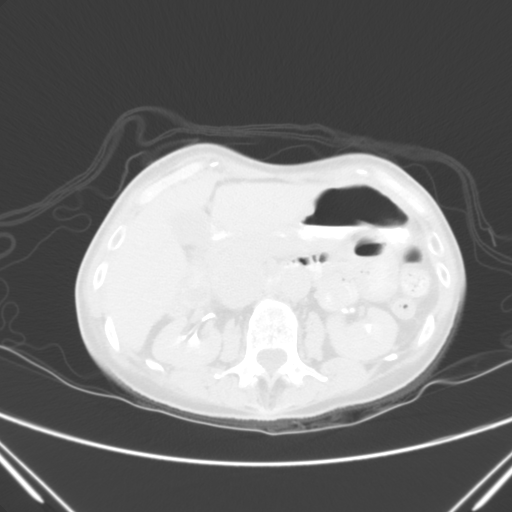
[im 10/64  lung]
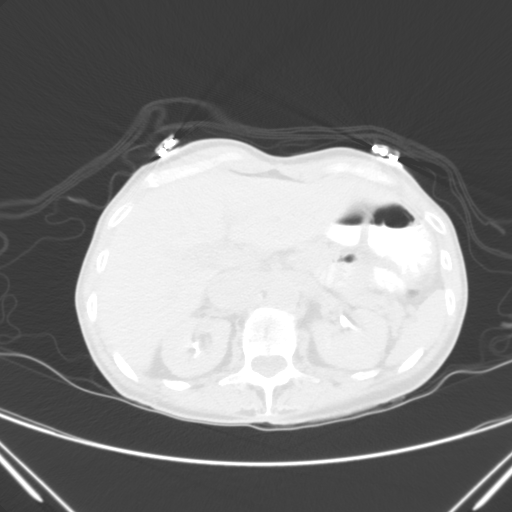
[im 15/64  lung]
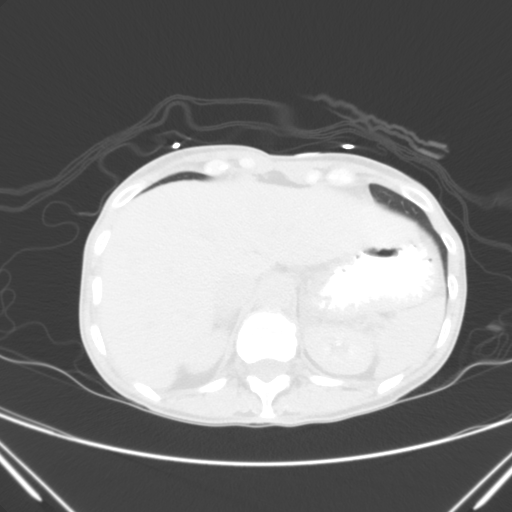
[im 19/64  lung]
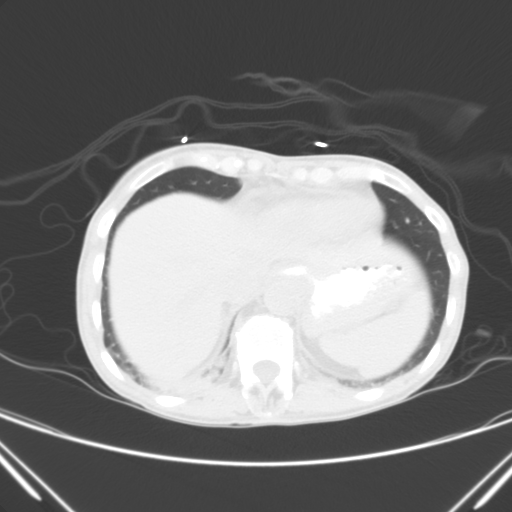
[im 24/64  mediastinal]
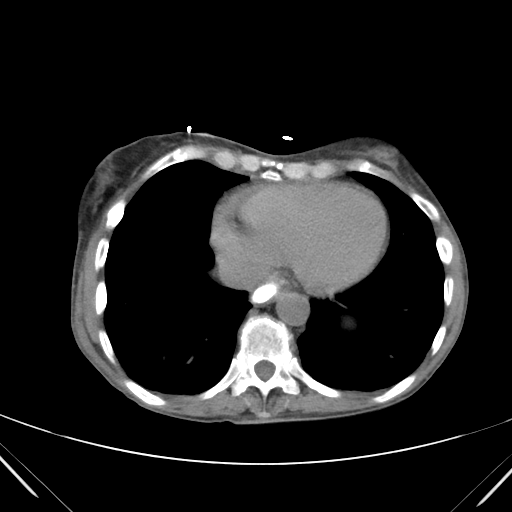
[im 24/64  lung]
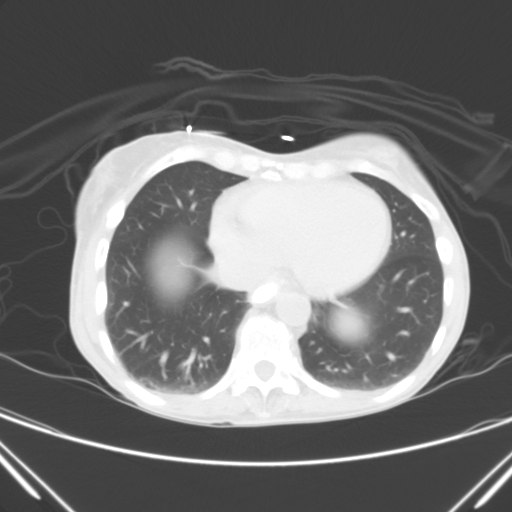
[im 29/64  lung]
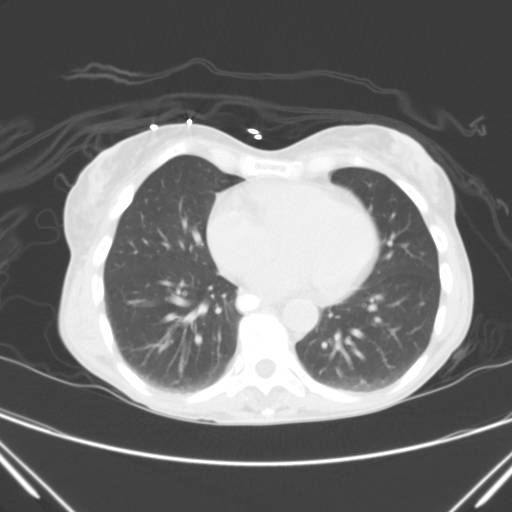
[im 36/64  lung]
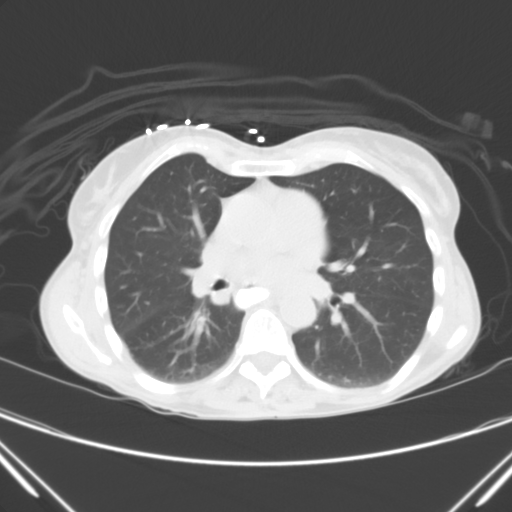
[im 40/64  lung]
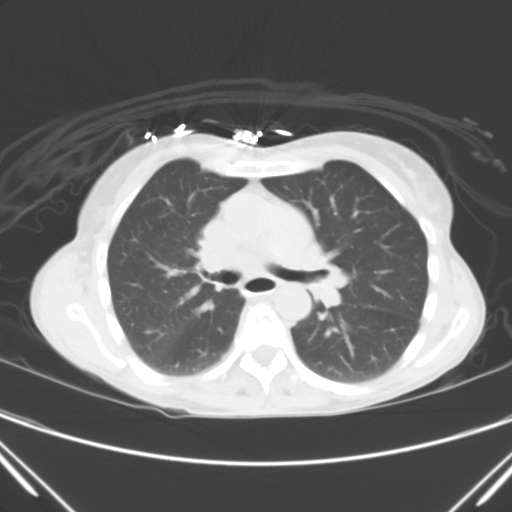
[im 45/64  mediastinal]
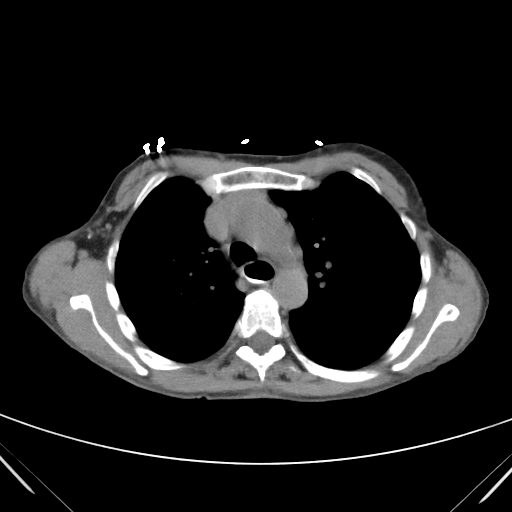
[im 45/64  lung]
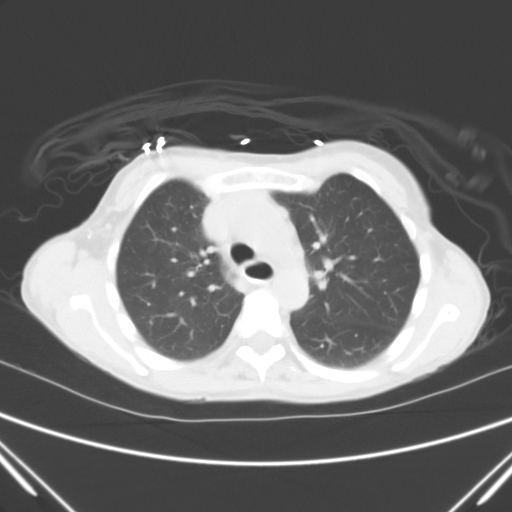
[im 50/64  lung]
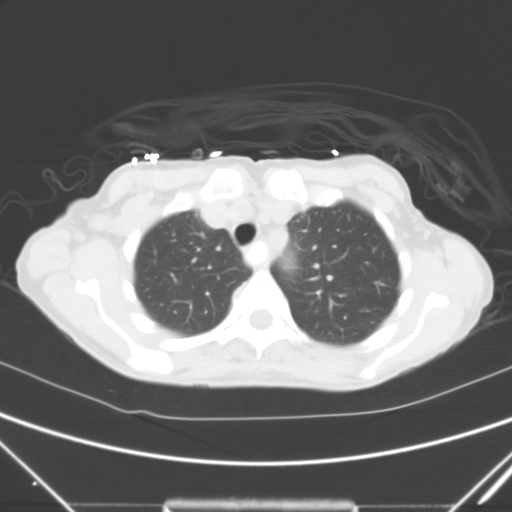
[im 54/64  lung]
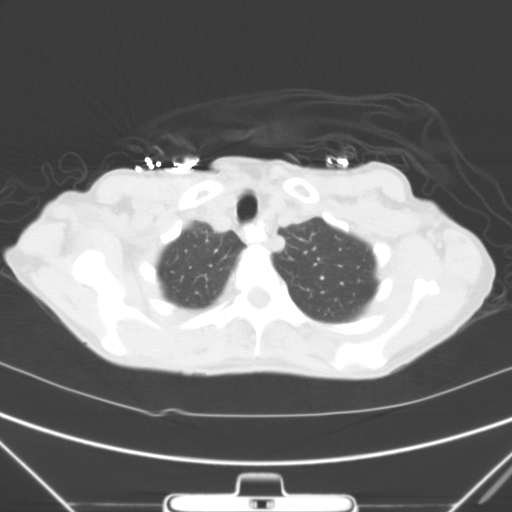
[im 59/64  lung]
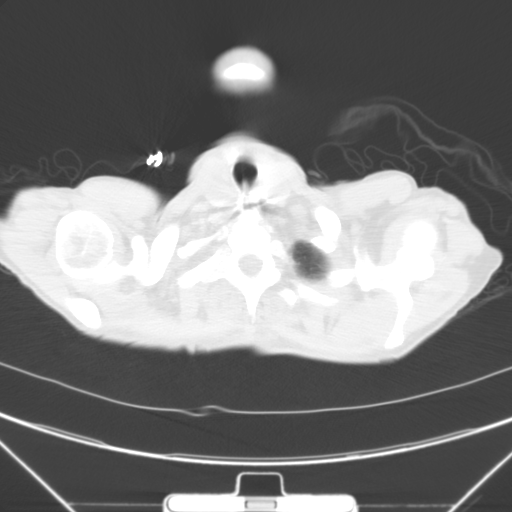

[Series 8044: mpr, coronal, coronal · coronal · 0.68mm/px · 3 of 78 slices shown]
[im 16/78  lung]
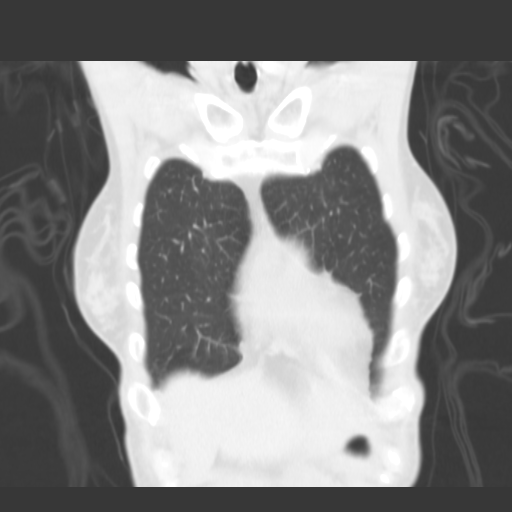
[im 31/78  lung]
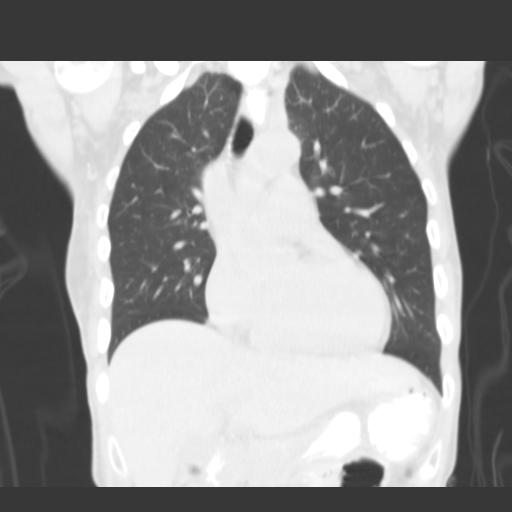
[im 47/78  lung]
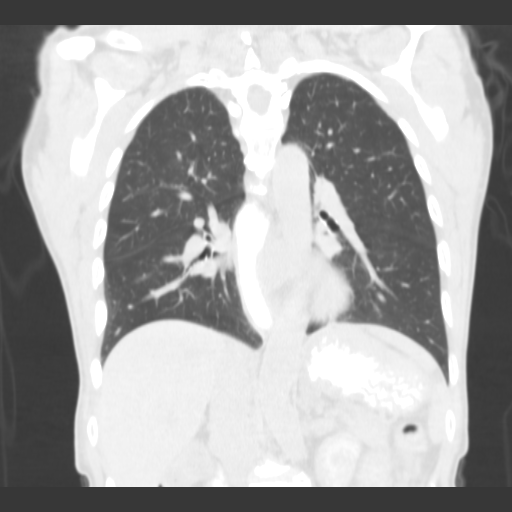

[15 of 36 positions shown; findings below may reference images not displayed]

FINDINGS: CT examination of the chest was obtained with oral contrast
material. Contrast material is demonstrated throughout the esophagus
and in the stomach. Visualization of the lower cervical esophagus is
limited due to motion and streak artifact. No contrast extravasation
outside of the esophagus is demonstrated. No mediastinal gas
collections are visualized. The esophageal tear is not identified.

Normal caliber thoracic aorta. Normal heart size. No abnormal
mediastinal fluid collections. Esophagus is not abnormally
distended. No significant lymphadenopathy in the chest.

Mild dependent changes in the lung bases with probable atelectasis
in the right lung base. No focal airspace disease or consolidation.
No significant interstitial disease in the lungs. Airways appear
patent. No pneumothorax. No effusion.

Visualized portions of the upper abdominal organs are unremarkable.
There is residual contrast material in the renal collecting systems,
likely from the previous contrast enhanced CT scan.

Degenerative changes in the thoracic spine. Old right rib fractures.
No destructive bone lesions appreciated.
IMPRESSION: No contrast extravasation, mediastinal gas or fluid collections are
demonstrated. Esophageal tear is not visualized. Atelectasis in the
right lung base.

## 2013-09-23 MED ORDER — POTASSIUM CHLORIDE IN NACL 40-0.9 MEQ/L-% IV SOLN
INTRAVENOUS | Status: DC
  Administered 2013-09-23 – 2013-09-24 (×2): via INTRAVENOUS
  Filled 2013-09-23 (×3): qty 1000

## 2013-09-23 MED ORDER — SODIUM CHLORIDE 0.9 % IV SOLN: INTRAVENOUS | Status: AC

## 2013-09-23 MED ORDER — LORAZEPAM 2 MG/ML IJ SOLN
0.5000 mg | Freq: Once | INTRAMUSCULAR | Status: AC
  Administered 2013-09-23: 0.5 mg via INTRAVENOUS
  Filled 2013-09-23: qty 1

## 2013-09-23 MED ORDER — SODIUM CHLORIDE 0.9 % IV SOLN: 1000.0000 mL | INTRAVENOUS | Status: DC

## 2013-09-23 NOTE — Progress Notes (Signed)
INITIAL NUTRITION ASSESSMENT  DOCUMENTATION CODES Per approved criteria  -Underweight   INTERVENTION: Advance diet as medically appropriate  RD to follow for nutrition care plan, add interventions accordingly  NUTRITION DIAGNOSIS: Inadequate oral intake related to inability to eat as evidenced by NPO status   Goal: Pt to meet >/= 90% of their estimated nutrition needs   Monitor:  PO diet advancement & intake, weight, labs, I/O's  Reason for Assessment: Health History   62 y.o. female  Admitting Dx: esophageal tear  ASSESSMENT: Patient with PMH of eosinophilic esophagitis; presented to [facility] ED after having a pill get stuck in her throat and would not pass; s/p endoscopy which revealed a tear in the distal esophagus with pill fragments; transferred to [facility] for further work-up.  GI note reviewed -- patient s/p CT of the chest with Gastrograffin which did not show perforation.   Patient reports good PO intake PTA with no recent unintentional weight loss; patient is underweight for height, however, she's been "small-framed" all her life; patient potentially at nutrition risk if a "liquid" diet is warranted (with gradual transition to solids) -- RD to add supplements accordingly; patient understands and is agreeable.  Height: Ht Readings from Last 1 Encounters:  09/22/13 5\' 3"  (1.6 m)    Weight: Wt Readings from Last 1 Encounters:  09/23/13 103 lb 9.6 oz (46.993 kg)    Ideal Body Weight: 115 lb  % Ideal Body Weight: 90%  Wt Readings from Last 10 Encounters:  09/23/13 103 lb 9.6 oz (46.993 kg)  09/23/13 103 lb 9.6 oz (46.993 kg)    Usual Body Weight: ---  % Usual Body Weight: ---  BMI:  Body mass index is 18.36 kg/(m^2).  Estimated Nutritional Needs: Kcal: 1400-1600 Protein: 65-75 gm Fluid: >/= 1.5 L  Skin: Intact  Diet Order: NPO  EDUCATION NEEDS: -No education needs identified at this time   Intake/Output Summary (Last 24 hours) at 09/23/13 1119 Last data  filed at 09/23/13 1038  Gross per 24 hour  Intake    296 ml  Output    500 ml  Net   -204 ml    Labs:   Recent Labs Lab 09/22/13 1900 09/23/13 0605  NA 141 143  K 3.5 3.0*  CL 108 110  CO2 24 22  BUN 15 11  CREATININE 0.75 0.77  CALCIUM 8.2* 7.8*  GLUCOSE 94 78    Scheduled Meds: . sodium chloride   Intravenous STAT    Continuous Infusions: . sodium chloride 1,000 mL (09/23/13 1610)    Past Medical History  Diagnosis Date  . Depression     Past Surgical History  Procedure Laterality Date  . Tubal ligation    . Ovarian cyst removal  1973  . Esophagogastroduodenoscopy  2012  . Esophagogastroduodenoscopy N/A 09/22/2013    Procedure: ESOPHAGOGASTRODUODENOSCOPY (EGD);  Surgeon: Shirley Friar, MD;  Location: [facility];  Service: Endoscopy;  Laterality: N/A;    Maureen Chatters, RD, LDN Pager #: [phone] After-Hours Pager #: [phone]

## 2013-09-23 NOTE — Progress Notes (Signed)
Patient ID: Megan Cox, female   DOB: 1951-10-12, 62 y.o.   MRN: [id number]  Came by to see Megan Cox. Appreciate excellent care by Dr. Laneta Simmers. CT results noted showing no perforation. Tolerating ice chips. Dr. Randa Evens will continue to follow up for GI.

## 2013-09-23 NOTE — Progress Notes (Signed)
Pt up ambulating in hallway at this time; will cont. To monitor. 

## 2013-09-23 NOTE — ED Provider Notes (Signed)
Medical screening examination/treatment/procedure(s) were conducted as a shared visit with non-physician practitioner(s) and myself.  I personally evaluated the patient during the encounter  She has been asymptomatic since endoscopy earlier today. On exam his resting comfortably, no respiratory distress, no crepitus in her neck lungs clear. Abdomen soft nontender. CT results shared with patient as below.   2:39 AM discussed with Dr. Lavinia Sharps and he will admit for observation. IV fluids. N.p.o.  Results for orders placed during the hospital encounter of 09/22/13  CBC WITH DIFFERENTIAL      Result Value Range   WBC 9.1  4.0 - 10.5 K/uL   RBC 3.39 (*) 3.87 - 5.11 MIL/uL   Hemoglobin 11.0 (*) 12.0 - 15.0 g/dL   HCT 96.0 (*) 45.4 - 09.8 %   MCV 96.2  78.0 - 100.0 fL   MCH 32.4  26.0 - 34.0 pg   MCHC 33.7  30.0 - 36.0 g/dL   RDW 11.9  14.7 - 82.9 %   Platelets 187  150 - 400 K/uL   Neutrophils Relative % 69  43 - 77 %   Neutro Abs 6.2  1.7 - 7.7 K/uL   Lymphocytes Relative 21  12 - 46 %   Lymphs Abs 1.9  0.7 - 4.0 K/uL   Monocytes Relative 7  3 - 12 %   Monocytes Absolute 0.7  0.1 - 1.0 K/uL   Eosinophils Relative 3  0 - 5 %   Eosinophils Absolute 0.3  0.0 - 0.7 K/uL   Basophils Relative 0  0 - 1 %   Basophils Absolute 0.0  0.0 - 0.1 K/uL  BASIC METABOLIC PANEL      Result Value Range   Sodium 141  135 - 145 mEq/L   Potassium 3.5  3.5 - 5.1 mEq/L   Chloride 108  96 - 112 mEq/L   CO2 24  19 - 32 mEq/L   Glucose, Bld 94  70 - 99 mg/dL   BUN 15  6 - 23 mg/dL   Creatinine, Ser 5.62  0.50 - 1.10 mg/dL   Calcium 8.2 (*) 8.4 - 10.5 mg/dL   GFR calc non Af Amer 89 (*) >90 mL/min   GFR calc Af Amer >90  >90 mL/min   Dg Neck Soft Tissue  09/22/2013   CLINICAL DATA:  Dysphagia. Patient states she feels a pill she took last night stuck in her throat.  EXAM: NECK SOFT TISSUES - 1+ VIEW  COMPARISON:  None.  FINDINGS: There is calcification that project within the posterior thyroid cartilages and  in the arytenoid cartilages. There is no convincing radiopaque foreign body.  The airway is widely patent. The soft tissues are unremarkable.  There are degenerative changes throughout the visualized cervical spine and a developmental fusion of C2 and C3.  IMPRESSION: No radiopaque foreign body.   Electronically Signed   By: Amie Portland M.D.   On: 09/22/2013 12:11   Ct Soft Tissue Neck W Contrast  09/22/2013   CLINICAL DATA:  Dysphasia. Rule out swallowed foreign body.  EXAM: CT NECK WITH CONTRAST; CT CHEST WITH CONTRAST  TECHNIQUE: Multidetector CT imaging of the neck was performed with intravenous contrast.; Multidetector CT imaging of the chest was performed following the standard protocol during bolus administration of intravenous contrast.  CONTRAST:  OMNIPAQUE IOHEXOL 300 MG/ML  SOLN  COMPARISON:  Lateral cervical spine 09/22/2013  FINDINGS: CT NECK FINDINGS  Asymmetric enlargement of the right tonsil. Mass lesion not excluded. Direct visualization suggested  to this area.  The tongue is normal. The epiglottis and larynx are normal. No prevertebral soft tissue edema.  No abnormal gas in the soft tissues. No radiopaque foreign body is identified.  Parotid and submandibular glands are normal. No cervical adenopathy is present. Thyroid is normal.  Advanced cervical spondylosis and facet degeneration without focal bony abnormality. Fusion of C2 and C3 is noted.  CT CHEST FINDINGS  Negative for esophageal foreign body. No mediastinal gas or fluid collection is identified.  Heart size within normal limits. Lungs are clear without infiltrate effusion. No mass or adenopathy.  IMPRESSION:  CT NECK IMPRESSION  Negative for radiopaque foreign body.  Asymmetric enlargement of the right tonsil. Recommend ENT referral and direct visualization to rule out a mass lesion. No adenopathy.  CT CHEST IMPRESSION  No acute abnormality in the chest. No retained foreign body is identified.   Electronically Signed   By:  Marlan Palau M.D.   On: 09/22/2013 23:03   Ct Chest Wo Contrast  09/23/2013   CLINICAL DATA:  Esophageal tear in the distal esophagus noted at endoscopy. Transfer patient from [facility].  EXAM: CT CHEST WITHOUT CONTRAST  TECHNIQUE: Multidetector CT imaging of the chest was performed following the standard protocol without IV contrast.  COMPARISON:  09/22/2013  FINDINGS: CT examination of the chest was obtained with oral contrast material. Contrast material is demonstrated throughout the esophagus and in the stomach. Visualization of the lower cervical esophagus is limited due to motion and streak artifact. No contrast extravasation outside of the esophagus is demonstrated. No mediastinal gas collections are visualized. The esophageal tear is not identified.  Normal caliber thoracic aorta. Normal heart size. No abnormal mediastinal fluid collections. Esophagus is not abnormally distended. No significant lymphadenopathy in the chest.  Mild dependent changes in the lung bases with probable atelectasis in the right lung base. No focal airspace disease or consolidation. No significant interstitial disease in the lungs. Airways appear patent. No pneumothorax. No effusion.  Visualized portions of the upper abdominal organs are unremarkable. There is residual contrast material in the renal collecting systems, likely from the previous contrast enhanced CT scan.  Degenerative changes in the thoracic spine. Old right rib fractures. No destructive bone lesions appreciated.  IMPRESSION: No contrast extravasation, mediastinal gas or fluid collections are demonstrated. Esophageal tear is not visualized. Atelectasis in the right lung base.   Electronically Signed   By: Burman Nieves M.D.   On: 09/23/2013 01:44   Ct Chest W Contrast  09/22/2013   CLINICAL DATA:  Dysphasia. Rule out swallowed foreign body.  EXAM: CT NECK WITH CONTRAST; CT CHEST WITH CONTRAST  TECHNIQUE: Multidetector CT imaging of the neck was  performed with intravenous contrast.; Multidetector CT imaging of the chest was performed following the standard protocol during bolus administration of intravenous contrast.  CONTRAST:  OMNIPAQUE IOHEXOL 300 MG/ML  SOLN  COMPARISON:  Lateral cervical spine 09/22/2013  FINDINGS: CT NECK FINDINGS  Asymmetric enlargement of the right tonsil. Mass lesion not excluded. Direct visualization suggested to this area.  The tongue is normal. The epiglottis and larynx are normal. No prevertebral soft tissue edema.  No abnormal gas in the soft tissues. No radiopaque foreign body is identified.  Parotid and submandibular glands are normal. No cervical adenopathy is present. Thyroid is normal.  Advanced cervical spondylosis and facet degeneration without focal bony abnormality. Fusion of C2 and C3 is noted.  CT CHEST FINDINGS  Negative for esophageal foreign body. No mediastinal gas  or fluid collection is identified.  Heart size within normal limits. Lungs are clear without infiltrate effusion. No mass or adenopathy.  IMPRESSION:  CT NECK IMPRESSION  Negative for radiopaque foreign body.  Asymmetric enlargement of the right tonsil. Recommend ENT referral and direct visualization to rule out a mass lesion. No adenopathy.  CT CHEST IMPRESSION  No acute abnormality in the chest. No retained foreign body is identified.   Electronically Signed   By: Marlan Palau M.D.   On: 09/22/2013 23:03      Sunnie Nielsen, MD 09/23/13 [phone]

## 2013-09-23 NOTE — H&P (Signed)
Cardiothoracic Surgery Admission History and Physical  Megan Cox is an 62 y.o. female.   Chief Complaint:  Megan Cox tear of distal esophagus HPI:   The patient is a 62 year old woman with a history of eosinophilic esophagitis followed by Dr. Matthias Hughs and treated with Flolan who was in her usual state of health until she took an Allegra pill Saturday night. She says it felt like it stuck in her throat and would not pass. She tried drinking some water but it would not go down and the water came back up. She also had some wretching. She did sleep some but yesterday she felt like the pill was still there and she could not take anything by mouth so she went to the [facility] ER. She was seen by Dr. Bosie Clos from GI and had endoscopy. She reportedly started wretching during the procedure and was noted to have a tear in the distal esophagus. There were pill fragments noted. She says that after this procedure the pill felt like it was gone and her saliva would go down. She had no chest pain. I was called by Dr. Bosie Clos and I requested transfer to [facility] to work her up further to rule out esophageal perforation which is what he was concerned about. She was transferred and had a CT scan of the chest and neck with IV contrast only which was unremarkable. The chest CT was repeated with oral gastrograffin and there was no sign of gastrograffin or air outside the esophagus and the esophagus was completely visualized with the gastrograffin. She has had prior esophageal dilatation by Dr. Matthias Hughs.  Past Medical History  Diagnosis Date  . Depression     Past Surgical History  Procedure Laterality Date  . Tubal ligation    . Ovarian cyst removal  1973  . Esophagogastroduodenoscopy  2012  . Esophagogastroduodenoscopy N/A 09/22/2013    Procedure: ESOPHAGOGASTRODUODENOSCOPY (EGD);  Surgeon: Shirley Friar, MD;  Location: [facility];  Service: Endoscopy;  Laterality: N/A;    History reviewed. No pertinent  family history. Social History:  reports that she has never smoked. She does not have any smokeless tobacco history on file. She reports that she does not drink alcohol. Her drug history is not on file.  Allergies: No Known Allergies  Medications Prior to Admission  Medication Sig Dispense Refill  . ALPRAZolam (XANAX) 0.25 MG tablet Take 0.25 mg by mouth at bedtime as needed for anxiety.      . diphenhydrAMINE (BENADRYL) 25 MG tablet Take 25 mg by mouth every 6 (six) hours as needed for itching.      . fexofenadine (ALLEGRA) 180 MG tablet Take 180 mg by mouth daily.      Marland Kitchen glucosamine-chondroitin 500-400 MG tablet Take 1 tablet by mouth 2 (two) times daily. Cuts in half      . Multiple Vitamin (MULTIVITAMIN WITH MINERALS) TABS tablet Take 1 tablet by mouth daily.      . Soy Isoflavone 40 MG TABS Take 2 tablets by mouth daily.      . traZODone (DESYREL) 50 MG tablet Take 1 tablet by mouth 3 (three) times daily.        Results for orders placed during the hospital encounter of 09/22/13 (from the past 48 hour(s))  CBC WITH DIFFERENTIAL     Status: Abnormal   Collection Time    09/22/13  7:00 PM      Result Value Range   WBC 9.1  4.0 - 10.5 K/uL  RBC 3.39 (*) 3.87 - 5.11 MIL/uL   Hemoglobin 11.0 (*) 12.0 - 15.0 g/dL   HCT 65.7 (*) 84.6 - 96.2 %   MCV 96.2  78.0 - 100.0 fL   MCH 32.4  26.0 - 34.0 pg   MCHC 33.7  30.0 - 36.0 g/dL   RDW 95.2  84.1 - 32.4 %   Platelets 187  150 - 400 K/uL   Neutrophils Relative % 69  43 - 77 %   Neutro Abs 6.2  1.7 - 7.7 K/uL   Lymphocytes Relative 21  12 - 46 %   Lymphs Abs 1.9  0.7 - 4.0 K/uL   Monocytes Relative 7  3 - 12 %   Monocytes Absolute 0.7  0.1 - 1.0 K/uL   Eosinophils Relative 3  0 - 5 %   Eosinophils Absolute 0.3  0.0 - 0.7 K/uL   Basophils Relative 0  0 - 1 %   Basophils Absolute 0.0  0.0 - 0.1 K/uL  BASIC METABOLIC PANEL     Status: Abnormal   Collection Time    09/22/13  7:00 PM      Result Value Range   Sodium 141  135 - 145  mEq/L   Potassium 3.5  3.5 - 5.1 mEq/L   Chloride 108  96 - 112 mEq/L   CO2 24  19 - 32 mEq/L   Glucose, Bld 94  70 - 99 mg/dL   BUN 15  6 - 23 mg/dL   Creatinine, Ser 4.01  0.50 - 1.10 mg/dL   Calcium 8.2 (*) 8.4 - 10.5 mg/dL   GFR calc non Af Amer 89 (*) >90 mL/min   GFR calc Af Amer >90  >90 mL/min   Comment: (NOTE)     The eGFR has been calculated using the CKD EPI equation.     This calculation has not been validated in all clinical situations.     eGFR's persistently <90 mL/min signify possible Chronic Kidney     Disease.  COMPREHENSIVE METABOLIC PANEL     Status: Abnormal   Collection Time    09/23/13  6:05 AM      Result Value Range   Sodium 143  135 - 145 mEq/L   Potassium 3.0 (*) 3.5 - 5.1 mEq/L   Chloride 110  96 - 112 mEq/L   CO2 22  19 - 32 mEq/L   Glucose, Bld 78  70 - 99 mg/dL   BUN 11  6 - 23 mg/dL   Creatinine, Ser 0.27  0.50 - 1.10 mg/dL   Calcium 7.8 (*) 8.4 - 10.5 mg/dL   Total Protein 5.6 (*) 6.0 - 8.3 g/dL   Albumin 3.1 (*) 3.5 - 5.2 g/dL   AST 21  0 - 37 U/L   ALT 15  0 - 35 U/L   Alkaline Phosphatase 50  39 - 117 U/L   Total Bilirubin 0.4  0.3 - 1.2 mg/dL   GFR calc non Af Amer 88 (*) >90 mL/min   GFR calc Af Amer >90  >90 mL/min   Comment: (NOTE)     The eGFR has been calculated using the CKD EPI equation.     This calculation has not been validated in all clinical situations.     eGFR's persistently <90 mL/min signify possible Chronic Kidney     Disease.  CBC WITH DIFFERENTIAL     Status: Abnormal   Collection Time    09/23/13  6:05 AM  Result Value Range   WBC 8.0  4.0 - 10.5 K/uL   RBC 3.14 (*) 3.87 - 5.11 MIL/uL   Hemoglobin 10.3 (*) 12.0 - 15.0 g/dL   HCT 16.1 (*) 09.6 - 04.5 %   MCV 95.9  78.0 - 100.0 fL   MCH 32.8  26.0 - 34.0 pg   MCHC 34.2  30.0 - 36.0 g/dL   RDW 40.9  81.1 - 91.4 %   Platelets 172  150 - 400 K/uL   Neutrophils Relative % 61  43 - 77 %   Neutro Abs 4.8  1.7 - 7.7 K/uL   Lymphocytes Relative 26  12 - 46 %    Lymphs Abs 2.1  0.7 - 4.0 K/uL   Monocytes Relative 7  3 - 12 %   Monocytes Absolute 0.6  0.1 - 1.0 K/uL   Eosinophils Relative 6 (*) 0 - 5 %   Eosinophils Absolute 0.5  0.0 - 0.7 K/uL   Basophils Relative 0  0 - 1 %   Basophils Absolute 0.0  0.0 - 0.1 K/uL  PROTIME-INR     Status: None   Collection Time    09/23/13  6:05 AM      Result Value Range   Prothrombin Time 13.7  11.6 - 15.2 seconds   INR 1.07  0.00 - 1.49  LIPASE, BLOOD     Status: None   Collection Time    09/23/13  6:05 AM      Result Value Range   Lipase 26  11 - 59 U/L   Dg Neck Soft Tissue  09/22/2013   CLINICAL DATA:  Dysphagia. Patient states she feels a pill she took last night stuck in her throat.  EXAM: NECK SOFT TISSUES - 1+ VIEW  COMPARISON:  None.  FINDINGS: There is calcification that project within the posterior thyroid cartilages and in the arytenoid cartilages. There is no convincing radiopaque foreign body.  The airway is widely patent. The soft tissues are unremarkable.  There are degenerative changes throughout the visualized cervical spine and a developmental fusion of C2 and C3.  IMPRESSION: No radiopaque foreign body.   Electronically Signed   By: Amie Portland M.D.   On: 09/22/2013 12:11   Ct Soft Tissue Neck W Contrast  09/22/2013   CLINICAL DATA:  Dysphasia. Rule out swallowed foreign body.  EXAM: CT NECK WITH CONTRAST; CT CHEST WITH CONTRAST  TECHNIQUE: Multidetector CT imaging of the neck was performed with intravenous contrast.; Multidetector CT imaging of the chest was performed following the standard protocol during bolus administration of intravenous contrast.  CONTRAST:  OMNIPAQUE IOHEXOL 300 MG/ML  SOLN  COMPARISON:  Lateral cervical spine 09/22/2013  FINDINGS: CT NECK FINDINGS  Asymmetric enlargement of the right tonsil. Mass lesion not excluded. Direct visualization suggested to this area.  The tongue is normal. The epiglottis and larynx are normal. No prevertebral soft tissue edema.  No  abnormal gas in the soft tissues. No radiopaque foreign body is identified.  Parotid and submandibular glands are normal. No cervical adenopathy is present. Thyroid is normal.  Advanced cervical spondylosis and facet degeneration without focal bony abnormality. Fusion of C2 and C3 is noted.  CT CHEST FINDINGS  Negative for esophageal foreign body. No mediastinal gas or fluid collection is identified.  Heart size within normal limits. Lungs are clear without infiltrate effusion. No mass or adenopathy.  IMPRESSION:  CT NECK IMPRESSION  Negative for radiopaque foreign body.  Asymmetric enlargement of the right  tonsil. Recommend ENT referral and direct visualization to rule out a mass lesion. No adenopathy.  CT CHEST IMPRESSION  No acute abnormality in the chest. No retained foreign body is identified.   Electronically Signed   By: Marlan Palau M.D.   On: 09/22/2013 23:03   Ct Chest Wo Contrast  09/23/2013   CLINICAL DATA:  Esophageal tear in the distal esophagus noted at endoscopy. Transfer patient [facility].  EXAM: CT CHEST WITHOUT CONTRAST  TECHNIQUE: Multidetector CT imaging of the chest was performed following the standard protocol without IV contrast.  COMPARISON:  09/22/2013  FINDINGS: CT examination of the chest was obtained with oral contrast material. Contrast material is demonstrated throughout the esophagus and in the stomach. Visualization of the lower cervical esophagus is limited due to motion and streak artifact. No contrast extravasation outside of the esophagus is demonstrated. No mediastinal gas collections are visualized. The esophageal tear is not identified.  Normal caliber thoracic aorta. Normal heart size. No abnormal mediastinal fluid collections. Esophagus is not abnormally distended. No significant lymphadenopathy in the chest.  Mild dependent changes in the lung bases with probable atelectasis in the right lung base. No focal airspace disease or consolidation. No  significant interstitial disease in the lungs. Airways appear patent. No pneumothorax. No effusion.  Visualized portions of the upper abdominal organs are unremarkable. There is residual contrast material in the renal collecting systems, likely from the previous contrast enhanced CT scan.  Degenerative changes in the thoracic spine. Old right rib fractures. No destructive bone lesions appreciated.  IMPRESSION: No contrast extravasation, mediastinal gas or fluid collections are demonstrated. Esophageal tear is not visualized. Atelectasis in the right lung base.   Electronically Signed   By: Burman Nieves M.D.   On: 09/23/2013 01:44   Ct Chest W Contrast  09/22/2013   CLINICAL DATA:  Dysphasia. Rule out swallowed foreign body.  EXAM: CT NECK WITH CONTRAST; CT CHEST WITH CONTRAST  TECHNIQUE: Multidetector CT imaging of the neck was performed with intravenous contrast.; Multidetector CT imaging of the chest was performed following the standard protocol during bolus administration of intravenous contrast.  CONTRAST:  OMNIPAQUE IOHEXOL 300 MG/ML  SOLN  COMPARISON:  Lateral cervical spine 09/22/2013  FINDINGS: CT NECK FINDINGS  Asymmetric enlargement of the right tonsil. Mass lesion not excluded. Direct visualization suggested to this area.  The tongue is normal. The epiglottis and larynx are normal. No prevertebral soft tissue edema.  No abnormal gas in the soft tissues. No radiopaque foreign body is identified.  Parotid and submandibular glands are normal. No cervical adenopathy is present. Thyroid is normal.  Advanced cervical spondylosis and facet degeneration without focal bony abnormality. Fusion of C2 and C3 is noted.  CT CHEST FINDINGS  Negative for esophageal foreign body. No mediastinal gas or fluid collection is identified.  Heart size within normal limits. Lungs are clear without infiltrate effusion. No mass or adenopathy.  IMPRESSION:  CT NECK IMPRESSION  Negative for radiopaque foreign body.   Asymmetric enlargement of the right tonsil. Recommend ENT referral and direct visualization to rule out a mass lesion. No adenopathy.  CT CHEST IMPRESSION  No acute abnormality in the chest. No retained foreign body is identified.   Electronically Signed   By: Marlan Palau M.D.   On: 09/22/2013 23:03    Review of Systems  Constitutional: Negative.   HENT: Negative.   Eyes: Negative.   Respiratory: Negative.   Cardiovascular: Negative.   Gastrointestinal: Negative.  Negative  for nausea, vomiting, abdominal pain, blood in stool and melena.  Genitourinary: Negative.   Musculoskeletal: Negative.   Skin: Negative.   Neurological: Negative.   Endo/Heme/Allergies: Negative.   Psychiatric/Behavioral: Negative.     Blood pressure 105/60, pulse 83, temperature 98.5 F (36.9 C), temperature source Oral, resp. rate 16, height 5\' 3"  (1.6 m), weight 46.993 kg (103 lb 9.6 oz), SpO2 96.00%. Physical Exam  Constitutional: She is oriented to person, place, and time. She appears well-developed. No distress.  Says she is thirsty  HENT:  Head: Normocephalic and atraumatic.  Mouth/Throat: Oropharynx is clear and moist.  Eyes: EOM are normal. Pupils are equal, round, and reactive to light.  Neck: Normal range of motion. Neck supple. No JVD present. No thyromegaly present.  Cardiovascular: Normal rate, regular rhythm and normal heart sounds.  Exam reveals no gallop and no friction rub.   No murmur heard. Respiratory: Effort normal and breath sounds normal. No respiratory distress. She has no wheezes. She exhibits no tenderness.  GI: Soft. Bowel sounds are normal. She exhibits no distension and no mass. There is no tenderness.  Musculoskeletal: Normal range of motion. She exhibits no edema.  Lymphadenopathy:    She has no cervical adenopathy.  Neurological: She is alert and oriented to person, place, and time. She has normal strength. No cranial nerve deficit or sensory deficit.  Skin: Skin is warm and  dry.  Psychiatric: She has a normal mood and affect.       CLINICAL DATA: Dysphasia. Rule out swallowed foreign body.  EXAM:  CT NECK WITH CONTRAST; CT CHEST WITH CONTRAST  TECHNIQUE:  Multidetector CT imaging of the neck was performed with intravenous  contrast.; Multidetector CT imaging of the chest was performed  following the standard protocol during bolus administration of  intravenous contrast.  CONTRAST: OMNIPAQUE IOHEXOL 300 MG/ML SOLN  COMPARISON: Lateral cervical spine 09/22/2013  FINDINGS:  CT NECK FINDINGS  Asymmetric enlargement of the right tonsil. Mass lesion not  excluded. Direct visualization suggested to this area.  The tongue is normal. The epiglottis and larynx are normal. No  prevertebral soft tissue edema.  No abnormal gas in the soft tissues. No radiopaque foreign body is  identified.  Parotid and submandibular glands are normal. No cervical adenopathy  is present. Thyroid is normal.  Advanced cervical spondylosis and facet degeneration without focal  bony abnormality. Fusion of C2 and C3 is noted.  CT CHEST FINDINGS  Negative for esophageal foreign body. No mediastinal gas or fluid  collection is identified.  Heart size within normal limits. Lungs are clear without infiltrate  effusion. No mass or adenopathy.  IMPRESSION:  CT NECK IMPRESSION  Negative for radiopaque foreign body.  Asymmetric enlargement of the right tonsil. Recommend ENT referral  and direct visualization to rule out a mass lesion. No adenopathy.  CT CHEST IMPRESSION  No acute abnormality in the chest. No retained foreign body is  identified.  Electronically Signed  By: Marlan Palau M.D.  On: 09/22/2013 23:03   CT of chest with gastrograffin oral  CLINICAL DATA: Esophageal tear in the distal esophagus noted at  endoscopy. Transfer patient from [facility].  EXAM:  CT CHEST WITHOUT CONTRAST  TECHNIQUE:  Multidetector CT imaging of the chest was performed  following the  standard protocol without IV contrast.  COMPARISON: 09/22/2013  FINDINGS:  CT examination of the chest was obtained with oral contrast  material. Contrast material is demonstrated throughout the esophagus  and in the stomach. Visualization  of the lower cervical esophagus is  limited due to motion and streak artifact. No contrast extravasation  outside of the esophagus is demonstrated. No mediastinal gas  collections are visualized. The esophageal tear is not identified.  Normal caliber thoracic aorta. Normal heart size. No abnormal  mediastinal fluid collections. Esophagus is not abnormally  distended. No significant lymphadenopathy in the chest.  Mild dependent changes in the lung bases with probable atelectasis  in the right lung base. No focal airspace disease or consolidation.  No significant interstitial disease in the lungs. Airways appear  patent. No pneumothorax. No effusion.  Visualized portions of the upper abdominal organs are unremarkable.  There is residual contrast material in the renal collecting systems,  likely from the previous contrast enhanced CT scan.  Degenerative changes in the thoracic spine. Old right rib fractures.  No destructive bone lesions appreciated.  IMPRESSION:  No contrast extravasation, mediastinal gas or fluid collections are  demonstrated. Esophageal tear is not visualized. Atelectasis in the  right lung base.  Electronically Signed  By: Burman Nieves M.D.  On: 09/23/2013 01:44   Assessment/Plan  There is no sign of perforation but she may have had a Mallory Weiss tear of the distal esophagus. She says she feels fine now and wants to start taking liquids. Will start ice chips and observe. We will call Dr. Matthias Hughs who knows her prior GI history to see what he thinks. It sounds like her initial problem with the pill was in the cervical esophagus and then wretching may have caused a distal esophageal tear.  Megan Cox  K 09/23/2013, 7:55 AM

## 2013-09-23 NOTE — Progress Notes (Signed)
[facility] PROGRESS NOTE Subjective patient denies any shortness of breath or chest pain or abdominal pain at this time. CT of the chest with Gastrograffin and did not show any extravasation.  Objective: Vital signs in last 24 hours: Temp:  [98 F (36.7 C)-98.8 F (37.1 C)] 98.5 F (36.9 C) (10/20 0805) Pulse Rate:  [56-96] 78 (10/20 0805) Resp:  [13-67] 18 (10/20 0805) BP: (85-126)/(45-79) 104/61 mmHg (10/20 0805) SpO2:  [94 %-100 %] 98 % (10/20 0805) Weight:  [45.36 kg (100 lb)-46.993 kg (103 lb 9.6 oz)] 46.993 kg (103 lb 9.6 oz) (10/20 0349) Last BM Date: 09/22/13  Intake/Output from previous day:   Intake/Output this shift: Total I/O In: 125 [I.V.:125] Out: -   PE: General-- patient sleeping in no distress easily arousable  Abdomen-- soft nontender  Lab Results:  Recent Labs  09/22/13 1900 09/23/13 0605  WBC 9.1 8.0  HGB 11.0* 10.3*  HCT 32.6* 30.1*  PLT 187 172   BMET  Recent Labs  09/22/13 1900 09/23/13 0605  NA 141 143  K 3.5 3.0*  CL 108 110  CO2 24 22  CREATININE 0.75 0.77   LFT  Recent Labs  09/23/13 0605  PROT 5.6*  AST 21  ALT 15  ALKPHOS 50  BILITOT 0.4   PT/INR  Recent Labs  09/23/13 0605  LABPROT 13.7  INR 1.07   PANCREAS  Recent Labs  09/23/13 0605  LIPASE 26         Studies/Results: Dg Neck Soft Tissue  09/22/2013   CLINICAL DATA:  Dysphagia. Patient states she feels a pill she took last night stuck in her throat.  EXAM: NECK SOFT TISSUES - 1+ VIEW  COMPARISON:  None.  FINDINGS: There is calcification that project within the posterior thyroid cartilages and in the arytenoid cartilages. There is no convincing radiopaque foreign body.  The airway is widely patent. The soft tissues are unremarkable.  There are degenerative changes throughout the visualized cervical spine and a developmental fusion of C2 and C3.  IMPRESSION: No radiopaque foreign body.   Electronically Signed   By: Amie Portland M.D.   On:  09/22/2013 12:11   Ct Soft Tissue Neck W Contrast  09/22/2013   CLINICAL DATA:  Dysphasia. Rule out swallowed foreign body.  EXAM: CT NECK WITH CONTRAST; CT CHEST WITH CONTRAST  TECHNIQUE: Multidetector CT imaging of the neck was performed with intravenous contrast.; Multidetector CT imaging of the chest was performed following the standard protocol during bolus administration of intravenous contrast.  CONTRAST:  OMNIPAQUE IOHEXOL 300 MG/ML  SOLN  COMPARISON:  Lateral cervical spine 09/22/2013  FINDINGS: CT NECK FINDINGS  Asymmetric enlargement of the right tonsil. Mass lesion not excluded. Direct visualization suggested to this area.  The tongue is normal. The epiglottis and larynx are normal. No prevertebral soft tissue edema.  No abnormal gas in the soft tissues. No radiopaque foreign body is identified.  Parotid and submandibular glands are normal. No cervical adenopathy is present. Thyroid is normal.  Advanced cervical spondylosis and facet degeneration without focal bony abnormality. Fusion of C2 and C3 is noted.  CT CHEST FINDINGS  Negative for esophageal foreign body. No mediastinal gas or fluid collection is identified.  Heart size within normal limits. Lungs are clear without infiltrate effusion. No mass or adenopathy.  IMPRESSION:  CT NECK IMPRESSION  Negative for radiopaque foreign body.  Asymmetric enlargement of the right tonsil. Recommend ENT referral and direct visualization to rule out a mass lesion. No  adenopathy.  CT CHEST IMPRESSION  No acute abnormality in the chest. No retained foreign body is identified.   Electronically Signed   By: Marlan Palau M.D.   On: 09/22/2013 23:03   Ct Chest Wo Contrast  09/23/2013   CLINICAL DATA:  Esophageal tear in the distal esophagus noted at endoscopy. Transfer patient from [facility].  EXAM: CT CHEST WITHOUT CONTRAST  TECHNIQUE: Multidetector CT imaging of the chest was performed following the standard protocol without IV contrast.   COMPARISON:  09/22/2013  FINDINGS: CT examination of the chest was obtained with oral contrast material. Contrast material is demonstrated throughout the esophagus and in the stomach. Visualization of the lower cervical esophagus is limited due to motion and streak artifact. No contrast extravasation outside of the esophagus is demonstrated. No mediastinal gas collections are visualized. The esophageal tear is not identified.  Normal caliber thoracic aorta. Normal heart size. No abnormal mediastinal fluid collections. Esophagus is not abnormally distended. No significant lymphadenopathy in the chest.  Mild dependent changes in the lung bases with probable atelectasis in the right lung base. No focal airspace disease or consolidation. No significant interstitial disease in the lungs. Airways appear patent. No pneumothorax. No effusion.  Visualized portions of the upper abdominal organs are unremarkable. There is residual contrast material in the renal collecting systems, likely from the previous contrast enhanced CT scan.  Degenerative changes in the thoracic spine. Old right rib fractures. No destructive bone lesions appreciated.  IMPRESSION: No contrast extravasation, mediastinal gas or fluid collections are demonstrated. Esophageal tear is not visualized. Atelectasis in the right lung base.   Electronically Signed   By: Burman Nieves M.D.   On: 09/23/2013 01:44   Ct Chest W Contrast  09/22/2013   CLINICAL DATA:  Dysphasia. Rule out swallowed foreign body.  EXAM: CT NECK WITH CONTRAST; CT CHEST WITH CONTRAST  TECHNIQUE: Multidetector CT imaging of the neck was performed with intravenous contrast.; Multidetector CT imaging of the chest was performed following the standard protocol during bolus administration of intravenous contrast.  CONTRAST:  OMNIPAQUE IOHEXOL 300 MG/ML  SOLN  COMPARISON:  Lateral cervical spine 09/22/2013  FINDINGS: CT NECK FINDINGS  Asymmetric enlargement of the right tonsil. Mass  lesion not excluded. Direct visualization suggested to this area.  The tongue is normal. The epiglottis and larynx are normal. No prevertebral soft tissue edema.  No abnormal gas in the soft tissues. No radiopaque foreign body is identified.  Parotid and submandibular glands are normal. No cervical adenopathy is present. Thyroid is normal.  Advanced cervical spondylosis and facet degeneration without focal bony abnormality. Fusion of C2 and C3 is noted.  CT CHEST FINDINGS  Negative for esophageal foreign body. No mediastinal gas or fluid collection is identified.  Heart size within normal limits. Lungs are clear without infiltrate effusion. No mass or adenopathy.  IMPRESSION:  CT NECK IMPRESSION  Negative for radiopaque foreign body.  Asymmetric enlargement of the right tonsil. Recommend ENT referral and direct visualization to rule out a mass lesion. No adenopathy.  CT CHEST IMPRESSION  No acute abnormality in the chest. No retained foreign body is identified.   Electronically Signed   By: Marlan Palau M.D.   On: 09/22/2013 23:03    Medications: I have reviewed the patient's current medications.  Assessment/Plan: 1. Esophageal tear. CT with Gastrograffin does not show perforation. Suspect Mallory Weiss tear. Appears to be tolerating ice chips without any problem. Would not want to perform endoscopy as long as  she is doing well to avoid any chance of progression of the esophageal tear. She's doing well tomorrow, we'll go ahead and advanced clear liquids.   Ira Busbin JR,Lynnann Knudsen L 09/23/2013, a 9:19 AM

## 2013-09-23 NOTE — Care Management Note (Unsigned)
    Page 1 of 1   09/23/2013     3:06:52 PM   CARE MANAGEMENT NOTE 09/23/2013  Patient:  Megan Cox, Megan Cox   Account Number:  [id number]  Date Initiated:  09/23/2013  Documentation initiated by:  Kennita Pavlovich  Subjective/Objective Assessment:   PT ADM ON 09/22/13 WITH ESOPHAGEAL TEAR.  PTA, PT INDEPENDENT, LIVES ALONE.     Action/Plan:   WILL FOLLOW FOR DISCHARGE NEEDS AS PT PROGRESSES.   Anticipated DC Date:  09/25/2013   Anticipated DC Plan:  HOME/SELF CARE      DC Planning Services  CM consult      Choice offered to / List presented to:             Status of service:  In process, will continue to follow Medicare Important Message given?   (If response is "NO", the following Medicare IM given date fields will be blank) Date Medicare IM given:   Date Additional Medicare IM given:    Discharge Disposition:    Per UR Regulation:  Reviewed for med. necessity/level of care/duration of stay  If discussed at Long Length of Stay Meetings, dates discussed:    Comments:

## 2013-09-24 LAB — OCCULT BLOOD X 1 CARD TO LAB, STOOL: Fecal Occult Bld: NEGATIVE

## 2013-09-24 MED ORDER — TRAZODONE HCL 50 MG PO TABS
50.0000 mg | ORAL_TABLET | Freq: Every morning | ORAL | Status: DC
  Administered 2013-09-24 – 2013-09-25 (×2): 50 mg via ORAL
  Filled 2013-09-24 (×2): qty 1

## 2013-09-24 MED ORDER — LORAZEPAM 2 MG/ML IJ SOLN: 0.5000 mg | Freq: Every evening | INTRAMUSCULAR | Status: DC | PRN

## 2013-09-24 MED ORDER — TRAZODONE HCL 50 MG PO TABS: 50.0000 mg | ORAL_TABLET | Freq: Two times a day (BID) | ORAL | Status: DC

## 2013-09-24 MED ORDER — ALPRAZOLAM 0.25 MG PO TABS: 0.2500 mg | ORAL_TABLET | Freq: Every evening | ORAL | Status: DC | PRN

## 2013-09-24 MED ORDER — TRAZODONE HCL 50 MG PO TABS
50.0000 mg | ORAL_TABLET | Freq: Three times a day (TID) | ORAL | Status: DC
  Filled 2013-09-24 (×3): qty 1

## 2013-09-24 MED ORDER — TRAZODONE HCL 100 MG PO TABS
100.0000 mg | ORAL_TABLET | Freq: Every day | ORAL | Status: DC
  Administered 2013-09-24: 100 mg via ORAL
  Filled 2013-09-24 (×2): qty 1

## 2013-09-24 NOTE — Progress Notes (Signed)
Pt reported small amount of pinkish blood in stool at this time; stool soft formed dark brown; will cont. To monitor.

## 2013-09-24 NOTE — Progress Notes (Signed)
Pt tolerated CL diet for lunch; will advance to full liquid diet; will cont. To monitor.

## 2013-09-24 NOTE — Progress Notes (Signed)
Pt taking shower per MD order and pt request; will cont. To monitor.

## 2013-09-24 NOTE — Progress Notes (Signed)
[facility] PROGRESS NOTE Subjective Absolutely no symptoms, no SOB, CPetc worried about her cats.  Objective: Vital signs in last 24 hours: Temp:  [98.1 F (36.7 C)-98.3 F (36.8 C)] 98.1 F (36.7 C) (10/21 0800) Pulse Rate:  [72-82] 72 (10/21 0800) Resp:  [18-20] 18 (10/21 0624) BP: (101-117)/(48-63) 105/63 mmHg (10/21 0800) SpO2:  [97 %-98 %] 97 % (10/21 0800) Last BM Date: 09/22/13  Intake/Output from previous day: 10/20 0701 - 10/21 0700 In: 1225.5 [P.O.:3; I.V.:1222.5] Out: 1200 [Urine:1200] Intake/Output this shift: Total I/O In: 75 [I.V.:75] Out: -   PE: General--anxious Heart-- Lungs--clear Abdomen--nontender  Lab Results:  Recent Labs  09/22/13 1900 09/23/13 0605  WBC 9.1 8.0  HGB 11.0* 10.3*  HCT 32.6* 30.1*  PLT 187 172   BMET  Recent Labs  09/22/13 1900 09/23/13 0605  NA 141 143  K 3.5 3.0*  CL 108 110  CO2 24 22  CREATININE 0.75 0.77   LFT  Recent Labs  09/23/13 0605  PROT 5.6*  AST 21  ALT 15  ALKPHOS 50  BILITOT 0.4   PT/INR  Recent Labs  09/23/13 0605  LABPROT 13.7  INR 1.07   PANCREAS  Recent Labs  09/23/13 0605  LIPASE 26         Studies/Results: Dg Neck Soft Tissue  09/22/2013   CLINICAL DATA:  Dysphagia. Patient states she feels a pill she took last night stuck in her throat.  EXAM: NECK SOFT TISSUES - 1+ VIEW  COMPARISON:  None.  FINDINGS: There is calcification that project within the posterior thyroid cartilages and in the arytenoid cartilages. There is no convincing radiopaque foreign body.  The airway is widely patent. The soft tissues are unremarkable.  There are degenerative changes throughout the visualized cervical spine and a developmental fusion of C2 and C3.  IMPRESSION: No radiopaque foreign body.   Electronically Signed   By: Amie Portland M.D.   On: 09/22/2013 12:11   Ct Soft Tissue Neck W Contrast  09/22/2013   CLINICAL DATA:  Dysphasia. Rule out swallowed foreign body.  EXAM:  CT NECK WITH CONTRAST; CT CHEST WITH CONTRAST  TECHNIQUE: Multidetector CT imaging of the neck was performed with intravenous contrast.; Multidetector CT imaging of the chest was performed following the standard protocol during bolus administration of intravenous contrast.  CONTRAST:  OMNIPAQUE IOHEXOL 300 MG/ML  SOLN  COMPARISON:  Lateral cervical spine 09/22/2013  FINDINGS: CT NECK FINDINGS  Asymmetric enlargement of the right tonsil. Mass lesion not excluded. Direct visualization suggested to this area.  The tongue is normal. The epiglottis and larynx are normal. No prevertebral soft tissue edema.  No abnormal gas in the soft tissues. No radiopaque foreign body is identified.  Parotid and submandibular glands are normal. No cervical adenopathy is present. Thyroid is normal.  Advanced cervical spondylosis and facet degeneration without focal bony abnormality. Fusion of C2 and C3 is noted.  CT CHEST FINDINGS  Negative for esophageal foreign body. No mediastinal gas or fluid collection is identified.  Heart size within normal limits. Lungs are clear without infiltrate effusion. No mass or adenopathy.  IMPRESSION:  CT NECK IMPRESSION  Negative for radiopaque foreign body.  Asymmetric enlargement of the right tonsil. Recommend ENT referral and direct visualization to rule out a mass lesion. No adenopathy.  CT CHEST IMPRESSION  No acute abnormality in the chest. No retained foreign body is identified.   Electronically Signed   By: Marlan Palau M.D.   On: 09/22/2013 23:03  Ct Chest Wo Contrast  09/23/2013   CLINICAL DATA:  Esophageal tear in the distal esophagus noted at endoscopy. Transfer patient from [facility].  EXAM: CT CHEST WITHOUT CONTRAST  TECHNIQUE: Multidetector CT imaging of the chest was performed following the standard protocol without IV contrast.  COMPARISON:  09/22/2013  FINDINGS: CT examination of the chest was obtained with oral contrast material. Contrast material is  demonstrated throughout the esophagus and in the stomach. Visualization of the lower cervical esophagus is limited due to motion and streak artifact. No contrast extravasation outside of the esophagus is demonstrated. No mediastinal gas collections are visualized. The esophageal tear is not identified.  Normal caliber thoracic aorta. Normal heart size. No abnormal mediastinal fluid collections. Esophagus is not abnormally distended. No significant lymphadenopathy in the chest.  Mild dependent changes in the lung bases with probable atelectasis in the right lung base. No focal airspace disease or consolidation. No significant interstitial disease in the lungs. Airways appear patent. No pneumothorax. No effusion.  Visualized portions of the upper abdominal organs are unremarkable. There is residual contrast material in the renal collecting systems, likely from the previous contrast enhanced CT scan.  Degenerative changes in the thoracic spine. Old right rib fractures. No destructive bone lesions appreciated.  IMPRESSION: No contrast extravasation, mediastinal gas or fluid collections are demonstrated. Esophageal tear is not visualized. Atelectasis in the right lung base.   Electronically Signed   By: Burman Nieves M.D.   On: 09/23/2013 01:44   Ct Chest W Contrast  09/22/2013   CLINICAL DATA:  Dysphasia. Rule out swallowed foreign body.  EXAM: CT NECK WITH CONTRAST; CT CHEST WITH CONTRAST  TECHNIQUE: Multidetector CT imaging of the neck was performed with intravenous contrast.; Multidetector CT imaging of the chest was performed following the standard protocol during bolus administration of intravenous contrast.  CONTRAST:  OMNIPAQUE IOHEXOL 300 MG/ML  SOLN  COMPARISON:  Lateral cervical spine 09/22/2013  FINDINGS: CT NECK FINDINGS  Asymmetric enlargement of the right tonsil. Mass lesion not excluded. Direct visualization suggested to this area.  The tongue is normal. The epiglottis and larynx are normal.  No prevertebral soft tissue edema.  No abnormal gas in the soft tissues. No radiopaque foreign body is identified.  Parotid and submandibular glands are normal. No cervical adenopathy is present. Thyroid is normal.  Advanced cervical spondylosis and facet degeneration without focal bony abnormality. Fusion of C2 and C3 is noted.  CT CHEST FINDINGS  Negative for esophageal foreign body. No mediastinal gas or fluid collection is identified.  Heart size within normal limits. Lungs are clear without infiltrate effusion. No mass or adenopathy.  IMPRESSION:  CT NECK IMPRESSION  Negative for radiopaque foreign body.  Asymmetric enlargement of the right tonsil. Recommend ENT referral and direct visualization to rule out a mass lesion. No adenopathy.  CT CHEST IMPRESSION  No acute abnormality in the chest. No retained foreign body is identified.   Electronically Signed   By: Marlan Palau M.D.   On: 09/22/2013 23:03    Medications: I have reviewed the patient's current medications.  Assessment/Plan: 1. Esophageal Laceration. Probably MW tear not perf tolerating ice-chips. Would start on CLs and advance to fulls as tolerated. Could then be discharged if no symptoms on soft foods for 10 days and f/u with Dr Matthias Hughs   Alivia Cimino JR,Glyn Zendejas L 09/24/2013, 9:08 AM

## 2013-09-24 NOTE — Progress Notes (Addendum)
      [address]       [place] [address]             [phone]       2 Days Post-Op Procedure(s) (LRB): ESOPHAGOGASTRODUODENOSCOPY (EGD) (N/A)  Subjective: Patient upset this am. She did not sleep well,wants to shower, wants something to "calm her nerves". Denies any pain.  Objective: Vital signs in last 24 hours: Temp:  [98.1 F (36.7 C)-98.3 F (36.8 C)] 98.1 F (36.7 C) (10/21 0800) Pulse Rate:  [72-82] 72 (10/21 0800) Cardiac Rhythm:  [-] Normal sinus rhythm (10/21 0736) Resp:  [18-20] 18 (10/21 0624) BP: (101-117)/(48-63) 105/63 mmHg (10/21 0800) SpO2:  [97 %-98 %] 97 % (10/21 0800)     Intake/Output from previous day: 10/20 0701 - 10/21 0700 In: 1225.5 [P.O.:3; I.V.:1222.5] Out: 1200 [Urine:1200]   Physical Exam:  Cardiovascular: RRR Pulmonary: Clear to auscultation bilaterally; no rales, wheezes, or rhonchi. Abdomen: Soft, non tender, bowel sounds present. Extremities: No lower extremity edema.   Lab Results: CBC: Recent Labs  09/22/13 1900 09/23/13 0605  WBC 9.1 8.0  HGB 11.0* 10.3*  HCT 32.6* 30.1*  PLT 187 172   BMET:  Recent Labs  09/22/13 1900 09/23/13 0605  NA 141 143  K 3.5 3.0*  CL 108 110  CO2 24 22  GLUCOSE 94 78  BUN 15 11  CREATININE 0.75 0.77  CALCIUM 8.2* 7.8*    PT/INR:  Recent Labs  09/23/13 0605  LABPROT 13.7  INR 1.07   ABG:  INR: Will add last result for INR, ABG once components are confirmed Will add last 4 CBG results once components are confirmed  Assessment/Plan:  1. CV - SR. 2.  GI- no evidence of esophageal perforation, likely Mallory Weiss tear. Tolerating ice chips. On IVF at 75 cc/hr. Hope to begin clear liquids soon. 3.Will order ativan IV PRN at bedtime. Hope to be able to give Trazadone once on clear liquids, as she takes this 3 times daily  ZIMMERMAN,DONIELLE MPA-C 09/24/2013,8:25 AM  Chart reviewed, patient examined, agree with above.

## 2013-09-24 NOTE — Discharge Summary (Signed)
Physician Discharge Summary       [address]       [place] [address]             [phone]    Patient ID: Megan Cox MRN: [id number] DOB/AGE: 1951/02/18 62 y.o.  Admit date: 09/22/2013 Discharge date: 09/25/2013  Admission Diagnoses: 1.Mallory Weiss tear of the distal esophagus 2.History of eosinophilic esophagitis  Discharge Diagnoses:  1.Mallory Weiss tear of the distal esophagus 2.History of eosinophilic esophagitis  Consults: GI   History of Presenting Illness: The patient is a 62 year old woman with a history of eosinophilic esophagitis followed by Dr. Matthias Hughs and treated with Flolan who was in her usual state of health until she took an Allegra pill Saturday night. She says it felt like it stuck in her throat and would not pass. She tried drinking some water but it would not go down and the water came back up. She also had some wretching. She did sleep some but yesterday she felt like the pill was still there and she could not take anything by mouth so she went to the [facility] ER. She was seen by Dr. Bosie Clos from GI and had endoscopy. She reportedly started wretching during the procedure and was noted to have a tear in the distal esophagus. There were pill fragments noted. She says that after this procedure the pill felt like it was gone and her saliva would go down. She had no chest pain. I was called by Dr. Bosie Clos and I requested transfer to [facility] to work her up further to rule out esophageal perforation which is what he was concerned about. She was transferred and had a CT scan of the chest and neck with IV contrast only which was unremarkable. The chest CT was repeated with oral gastrograffin and there was no sign of gastrograffin or air outside the esophagus and the esophagus was completely visualized with the gastrograffin. She has had prior esophageal dilatation by Dr. Matthias Hughs.    Brief Hospital Course:  She was NPO except ice chips after admission. She  has remained afebrile and hemodynamically stable. She has had no nausea, emesis, or pain.GI was consulted. There is no need for an EGD at this time. She has just been started on clear liquids. Her diet will be advanced as tolerated to a soft diet. Her IVF will be stopped once she is tolerating full liquids. Provided she is tolerating a diet and remains afebrile, and pending morning round evaluation, she will be surgically stable for discharge on 09/25/2013.She will need to follow up with her gastroenterologist after discharge.  Latest Vital Signs: Blood pressure 105/63, pulse 72, temperature 98.1 F (36.7 C), temperature source Oral, resp. rate 18, height 5\' 3"  (1.6 m), weight 46.993 kg (103 lb 9.6 oz), SpO2 97.00%.  Physical Exam: Cardiovascular: RRR  Pulmonary: Clear to auscultation bilaterally; no rales, wheezes, or rhonchi.  Abdomen: Soft, non tender, bowel sounds present.  Extremities: No lower extremity edema.   Discharge Condition:Stable  Recent laboratory studies:  Lab Results  Component Value Date   WBC 8.0 09/23/2013   HGB 10.3* 09/23/2013   HCT 30.1* 09/23/2013   MCV 95.9 09/23/2013   PLT 172 09/23/2013   Lab Results  Component Value Date   NA 143 09/23/2013   K 3.0* 09/23/2013   CL 110 09/23/2013   CO2 22 09/23/2013   CREATININE 0.77 09/23/2013   GLUCOSE 78 09/23/2013      Diagnostic Studies: Dg Neck Soft  Tissue  09/22/2013   CLINICAL DATA:  Dysphagia. Patient states she feels a pill she took last night stuck in her throat.  EXAM: NECK SOFT TISSUES - 1+ VIEW  COMPARISON:  None.  FINDINGS: There is calcification that project within the posterior thyroid cartilages and in the arytenoid cartilages. There is no convincing radiopaque foreign body.  The airway is widely patent. The soft tissues are unremarkable.  There are degenerative changes throughout the visualized cervical spine and a developmental fusion of C2 and C3.  IMPRESSION: No radiopaque foreign body.    Electronically Signed   By: Amie Portland M.D.   On: 09/22/2013 12:11   Ct Soft Tissue Neck W Contrast  09/22/2013   CLINICAL DATA:  Dysphasia. Rule out swallowed foreign body.  EXAM: CT NECK WITH CONTRAST; CT CHEST WITH CONTRAST  TECHNIQUE: Multidetector CT imaging of the neck was performed with intravenous contrast.; Multidetector CT imaging of the chest was performed following the standard protocol during bolus administration of intravenous contrast.  CONTRAST:  OMNIPAQUE IOHEXOL 300 MG/ML  SOLN  COMPARISON:  Lateral cervical spine 09/22/2013  FINDINGS: CT NECK FINDINGS  Asymmetric enlargement of the right tonsil. Mass lesion not excluded. Direct visualization suggested to this area.  The tongue is normal. The epiglottis and larynx are normal. No prevertebral soft tissue edema.  No abnormal gas in the soft tissues. No radiopaque foreign body is identified.  Parotid and submandibular glands are normal. No cervical adenopathy is present. Thyroid is normal.  Advanced cervical spondylosis and facet degeneration without focal bony abnormality. Fusion of C2 and C3 is noted.  CT CHEST FINDINGS  Negative for esophageal foreign body. No mediastinal gas or fluid collection is identified.  Heart size within normal limits. Lungs are clear without infiltrate effusion. No mass or adenopathy.  IMPRESSION:  CT NECK IMPRESSION  Negative for radiopaque foreign body.  Asymmetric enlargement of the right tonsil. Recommend ENT referral and direct visualization to rule out a mass lesion. No adenopathy.  CT CHEST IMPRESSION  No acute abnormality in the chest. No retained foreign body is identified.   Electronically Signed   By: Marlan Palau M.D.   On: 09/22/2013 23:03   Ct Chest Wo Contrast  09/23/2013   CLINICAL DATA:  Esophageal tear in the distal esophagus noted at endoscopy. Transfer patient from [facility].  EXAM: CT CHEST WITHOUT CONTRAST  TECHNIQUE: Multidetector CT imaging of the chest was performed  following the standard protocol without IV contrast.  COMPARISON:  09/22/2013  FINDINGS: CT examination of the chest was obtained with oral contrast material. Contrast material is demonstrated throughout the esophagus and in the stomach. Visualization of the lower cervical esophagus is limited due to motion and streak artifact. No contrast extravasation outside of the esophagus is demonstrated. No mediastinal gas collections are visualized. The esophageal tear is not identified.  Normal caliber thoracic aorta. Normal heart size. No abnormal mediastinal fluid collections. Esophagus is not abnormally distended. No significant lymphadenopathy in the chest.  Mild dependent changes in the lung bases with probable atelectasis in the right lung base. No focal airspace disease or consolidation. No significant interstitial disease in the lungs. Airways appear patent. No pneumothorax. No effusion.  Visualized portions of the upper abdominal organs are unremarkable. There is residual contrast material in the renal collecting systems, likely from the previous contrast enhanced CT scan.  Degenerative changes in the thoracic spine. Old right rib fractures. No destructive bone lesions appreciated.  IMPRESSION: No contrast extravasation, mediastinal  gas or fluid collections are demonstrated. Esophageal tear is not visualized. Atelectasis in the right lung base.   Electronically Signed   By: Burman Nieves M.D.   On: 09/23/2013 01:44   Ct Chest W Contrast  09/22/2013   CLINICAL DATA:  Dysphasia. Rule out swallowed foreign body.  EXAM: CT NECK WITH CONTRAST; CT CHEST WITH CONTRAST  TECHNIQUE: Multidetector CT imaging of the neck was performed with intravenous contrast.; Multidetector CT imaging of the chest was performed following the standard protocol during bolus administration of intravenous contrast.  CONTRAST:  OMNIPAQUE IOHEXOL 300 MG/ML  SOLN  COMPARISON:  Lateral cervical spine 09/22/2013  FINDINGS: CT NECK  FINDINGS  Asymmetric enlargement of the right tonsil. Mass lesion not excluded. Direct visualization suggested to this area.  The tongue is normal. The epiglottis and larynx are normal. No prevertebral soft tissue edema.  No abnormal gas in the soft tissues. No radiopaque foreign body is identified.  Parotid and submandibular glands are normal. No cervical adenopathy is present. Thyroid is normal.  Advanced cervical spondylosis and facet degeneration without focal bony abnormality. Fusion of C2 and C3 is noted.  CT CHEST FINDINGS  Negative for esophageal foreign body. No mediastinal gas or fluid collection is identified.  Heart size within normal limits. Lungs are clear without infiltrate effusion. No mass or adenopathy.  IMPRESSION:  CT NECK IMPRESSION  Negative for radiopaque foreign body.  Asymmetric enlargement of the right tonsil. Recommend ENT referral and direct visualization to rule out a mass lesion. No adenopathy.  CT CHEST IMPRESSION  No acute abnormality in the chest. No retained foreign body is identified.   Electronically Signed   By: Marlan Palau M.D.   On: 09/22/2013 23:03     Discharge Medications:   Medication List         ALPRAZolam 0.25 MG tablet  Commonly known as:  XANAX  Take 0.25 mg by mouth at bedtime as needed for anxiety.     diphenhydrAMINE 25 MG tablet  Commonly known as:  BENADRYL  Take 25 mg by mouth every 6 (six) hours as needed for itching.     fexofenadine 180 MG tablet  Commonly known as:  ALLEGRA  Take 180 mg by mouth daily.     glucosamine-chondroitin 500-400 MG tablet  Take 1 tablet by mouth 2 (two) times daily. Cuts in half     multivitamin with minerals Tabs tablet  Take 1 tablet by mouth daily.     Soy Isoflavone 40 MG Tabs  Take 2 tablets by mouth daily.     traZODone 50 MG tablet  Commonly known as:  DESYREL  Take 1 tablet by mouth 3 (three) times daily.        Follow Up Appointments:     Follow-up Information   Follow up with  Florencia Reasons, MD. (Call for a follow up appointment)    Specialty:  Gastroenterology   Contact information:   [address], [address] [place] [address] [phone]       Signed: Doree Fudge MPA-C 09/24/2013, 11:55 AM

## 2013-09-24 NOTE — Progress Notes (Signed)
Pt up ambulating in hallway at this time; will cont. To monitor.

## 2013-09-24 NOTE — Progress Notes (Signed)
Pt up ambulating in hallway at this time; will cont. To monitor. 

## 2013-09-25 MED ORDER — TRAZODONE HCL 50 MG PO TABS: ORAL_TABLET | ORAL | Status: AC

## 2013-09-25 NOTE — Progress Notes (Signed)
      [address]       [place] [address]             [phone]       3 Days Post-Op Procedure(s) (LRB): ESOPHAGOGASTRODUODENOSCOPY (EGD) (N/A)  Subjective: Patient tolerating a full liquid diet. IV site bleeding this am.  Objective: Vital signs in last 24 hours: Temp:  [98 F (36.7 C)-98.3 F (36.8 C)] 98.3 F (36.8 C) (10/22 0423) Pulse Rate:  [60-83] 83 (10/22 0647) Cardiac Rhythm:  [-] Normal sinus rhythm (10/21 0736) Resp:  [18] 18 (10/22 0423) BP: (90-110)/(40-65) 98/51 mmHg (10/22 0647) SpO2:  [97 %-100 %] 97 % (10/22 0423)     Intake/Output from previous day: 10/21 0701 - 10/22 0700 In: 1181 [P.O.:721; I.V.:460] Out: 900 [Urine:900]   Physical Exam:  Cardiovascular: RRR Abdomen: Soft, non tender, bowel sounds present.    Lab Results: CBC:  Recent Labs  09/22/13 1900 09/23/13 0605  WBC 9.1 8.0  HGB 11.0* 10.3*  HCT 32.6* 30.1*  PLT 187 172   BMET:   Recent Labs  09/22/13 1900 09/23/13 0605  NA 141 143  K 3.5 3.0*  CL 108 110  CO2 24 22  GLUCOSE 94 78  BUN 15 11  CREATININE 0.75 0.77  CALCIUM 8.2* 7.8*    PT/INR:   Recent Labs  09/23/13 0605  LABPROT 13.7  INR 1.07   ABG:  INR: Will add last result for INR, ABG once components are confirmed Will add last 4 CBG results once components are confirmed  Assessment/Plan:  1. CV - SR. 2.  GI- no evidence of esophageal perforation, likely Mallory Weiss tear. Tolerating full liquids. Will advance to a soft diet. 3.Discharge today  Valerya Maxton MPA-C 09/25/2013,7:27 AM

## 2013-09-25 NOTE — Progress Notes (Signed)
[facility] PROGRESS NOTE Subjective patient tolerating clear liquids with absolutely no symptoms whatsoever. No shortness of breath or chest pain  Objective: Vital signs in last 24 hours: Temp:  [98 F (36.7 C)-98.3 F (36.8 C)] 98.3 F (36.8 C) (10/22 0423) Pulse Rate:  [60-83] 83 (10/22 0647) Resp:  [18] 18 (10/22 0423) BP: (90-110)/(40-65) 98/51 mmHg (10/22 0647) SpO2:  [97 %-100 %] 97 % (10/22 0423) Last BM Date: 09/24/13  Intake/Output from previous day: 10/21 0701 - 10/22 0700 In: 1181 [P.O.:721; I.V.:460] Out: 900 [Urine:900] Intake/Output this shift:      Lab Results:  Recent Labs  09/22/13 1900 09/23/13 0605  WBC 9.1 8.0  HGB 11.0* 10.3*  HCT 32.6* 30.1*  PLT 187 172   BMET  Recent Labs  09/22/13 1900 09/23/13 0605  NA 141 143  K 3.5 3.0*  CL 108 110  CO2 24 22  CREATININE 0.75 0.77   LFT  Recent Labs  09/23/13 0605  PROT 5.6*  AST 21  ALT 15  ALKPHOS 50  BILITOT 0.4   PT/INR  Recent Labs  09/23/13 0605  LABPROT 13.7  INR 1.07   PANCREAS  Recent Labs  09/23/13 0605  LIPASE 26         Studies/Results: No results found.  Medications: I have reviewed the patient's current medications.  Assessment/Plan: 1. Esophageal laceration. Probably Mallery Weiss tear. At this point doubt perforation. Tolerating clear liquids. Should be okay to discharge home in full liquids/soft diet. Follow-up with Dr. Matthias Hughs and 3 to 4 weeks. 5 to 7 days should be okay to advance diet.   Maxfield Gildersleeve JR,Aivah [place] L 09/25/2013, 7:55 AM

## 2013-10-04 ENCOUNTER — Encounter (HOSPITAL_COMMUNITY): Payer: Self-pay | Admitting: *Deleted

## 2013-10-10 ENCOUNTER — Other Ambulatory Visit: Payer: Self-pay

## 2014-05-15 ENCOUNTER — Ambulatory Visit: Payer: Self-pay | Admitting: Obstetrics and Gynecology

## 2014-05-15 IMAGING — MG MAM DGTL SCRN MAM NO ORDER W/CAD
1 series · 4 of 4 positions shown · non-contrast
Comparison: Previous exam(s).

CLINICAL DATA: Screening.

EXAM:
DIGITAL SCREENING BILATERAL MAMMOGRAM WITH CAD

[R CC · right · 4 of 4 slices shown]
[im 1/4]
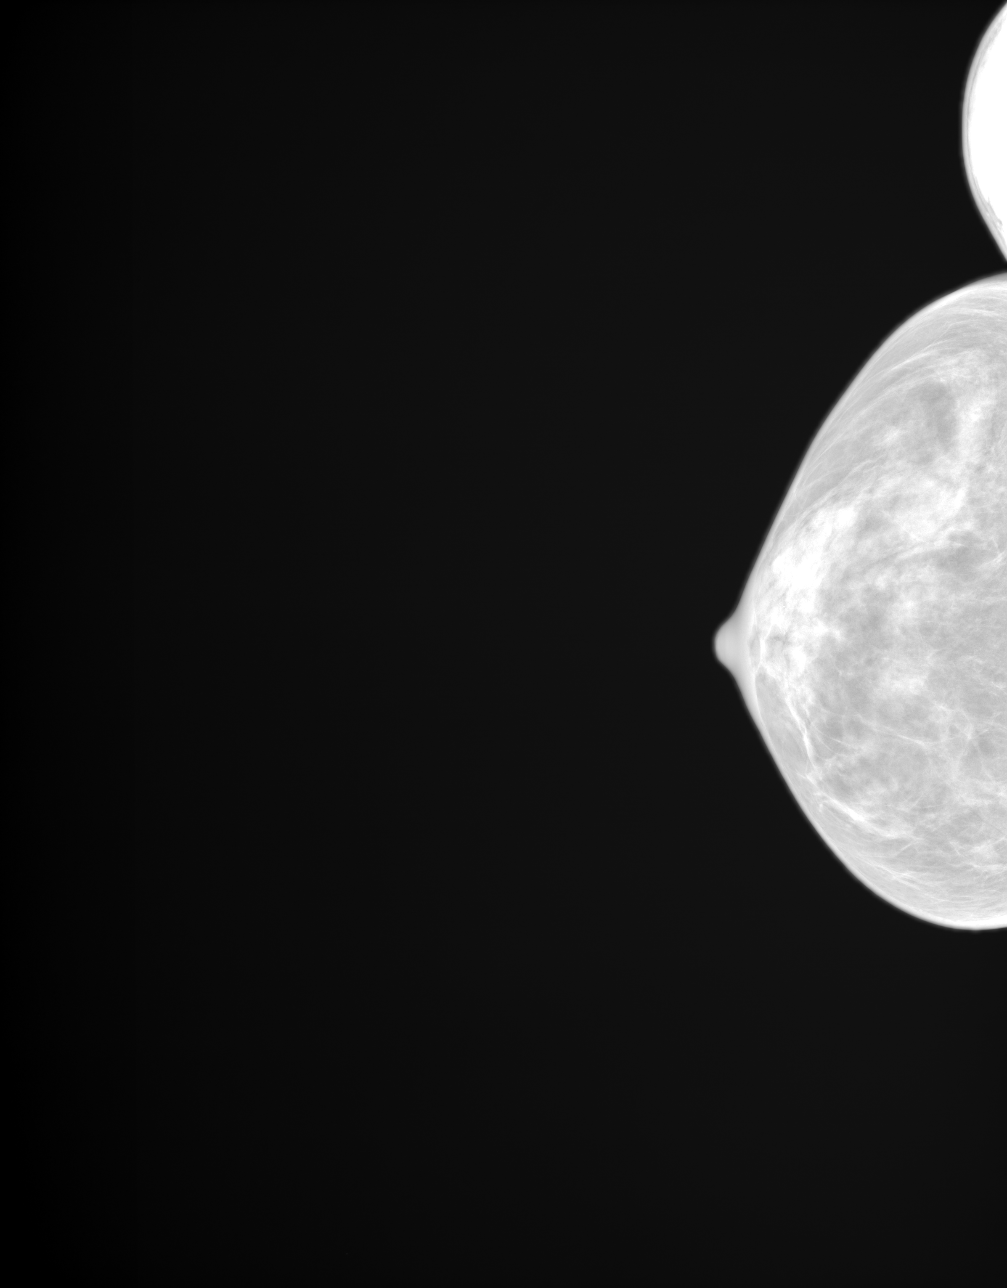
[im 2/4]
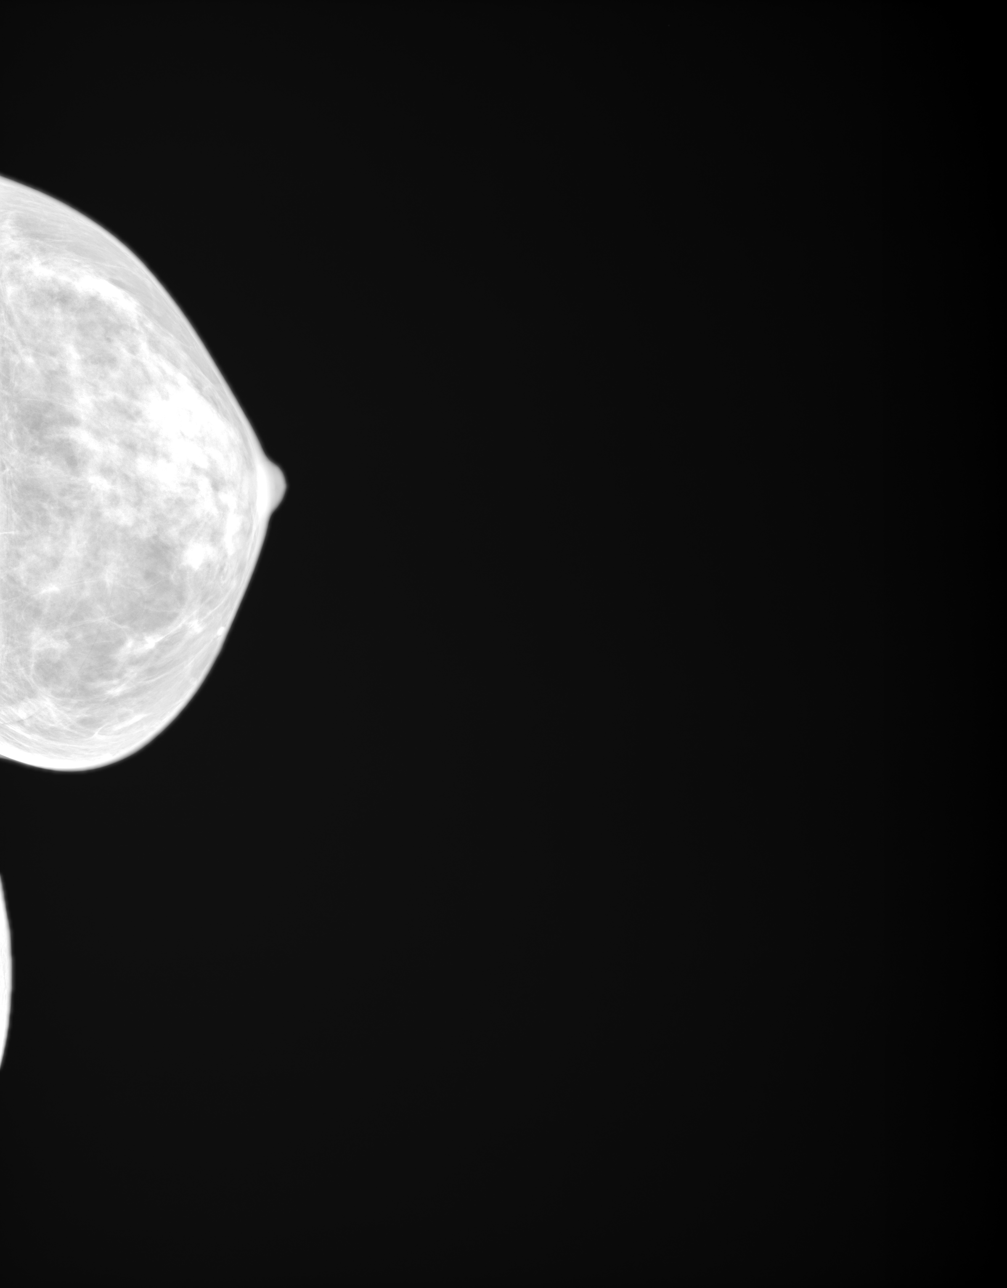
[im 3/4]
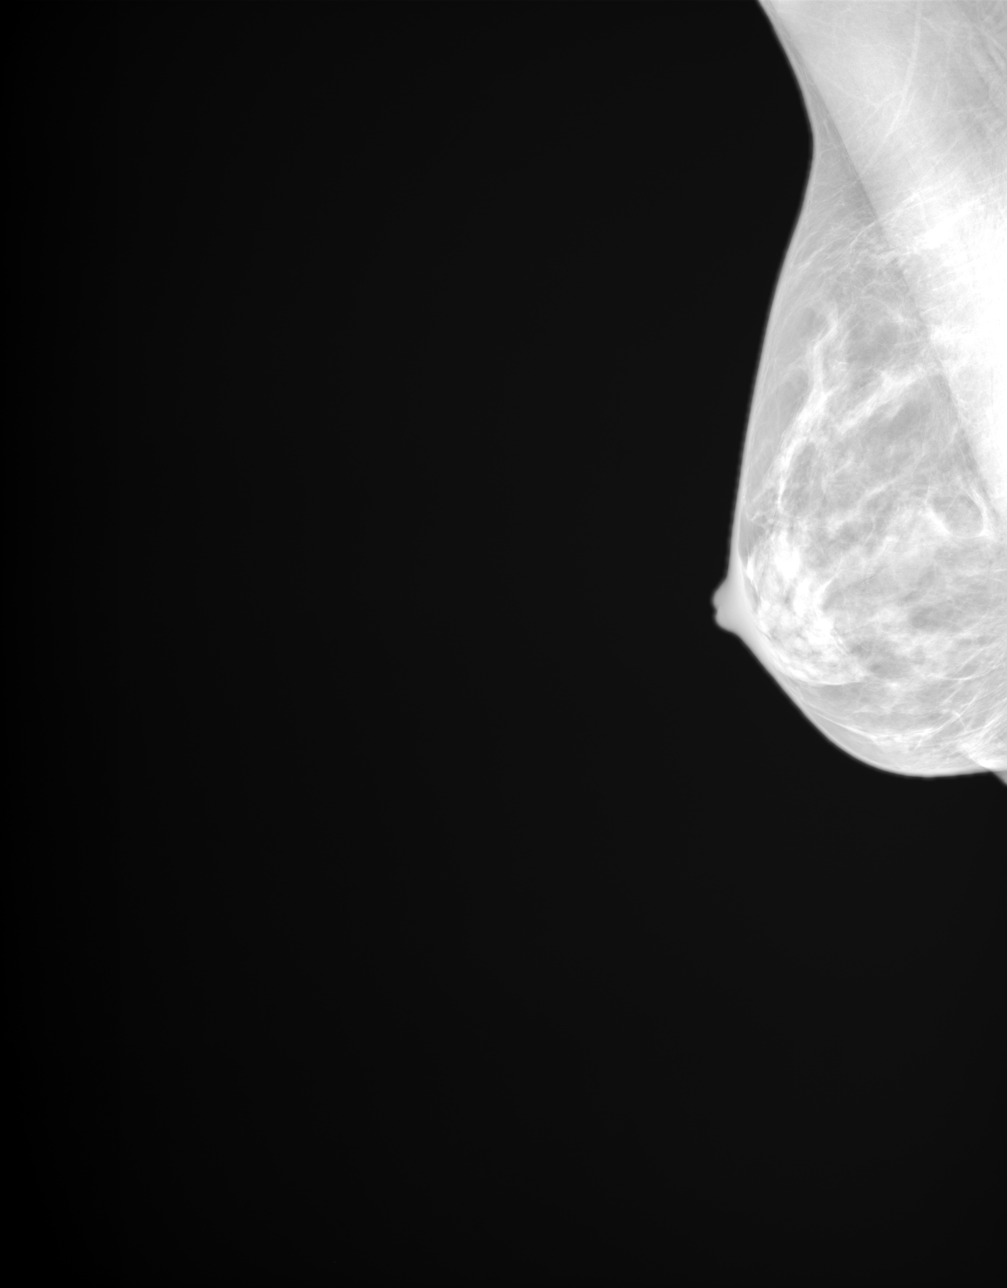
[im 4/4]
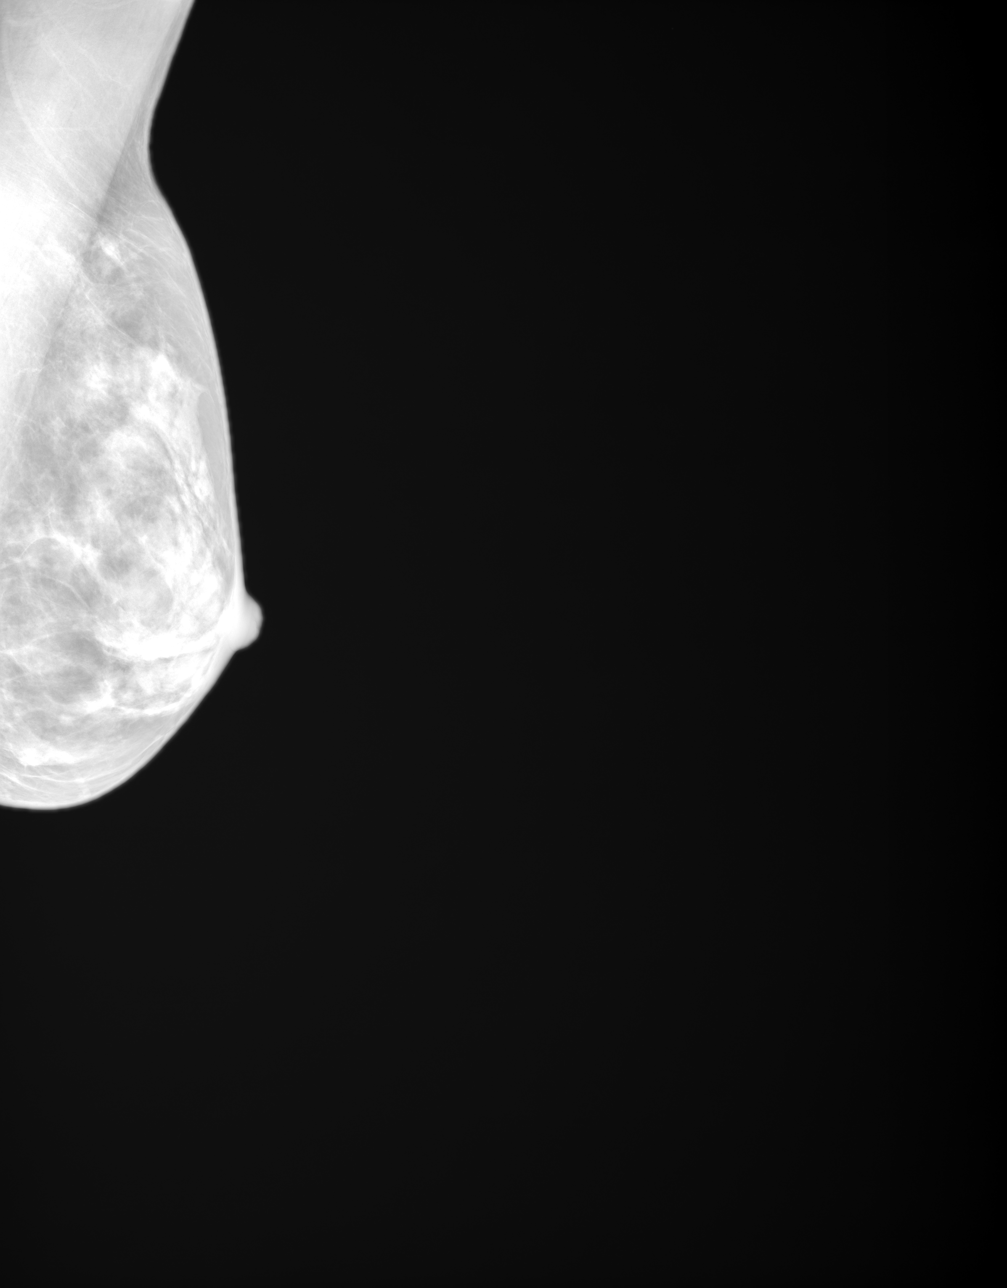

[4 of 4 positions shown; findings below may reference images not displayed]

ACR Breast Density Category c: The breast tissue is heterogeneously
dense, which may obscure small masses.
FINDINGS: There are no findings suspicious for malignancy. Images were
processed with CAD.
IMPRESSION: No mammographic evidence of malignancy. A result letter of this
screening mammogram will be mailed directly to the patient.

RECOMMENDATION:
Screening mammogram in one year. (Code:[0J])

BI-RADS CATEGORY  1: Negative.

## 2015-03-31 ENCOUNTER — Other Ambulatory Visit: Payer: Self-pay | Admitting: Obstetrics and Gynecology

## 2015-03-31 DIAGNOSIS — Z1231 Encounter for screening mammogram for malignant neoplasm of breast: Secondary | ICD-10-CM

## 2015-05-26 ENCOUNTER — Ambulatory Visit
Admission: RE | Admit: 2015-05-26 | Discharge: 2015-05-26 | Disposition: A | Discharge status: Home / Self Care | Payer: BC Managed Care – PPO | Source: Ambulatory Visit | Attending: Obstetrics and Gynecology | Admitting: Obstetrics and Gynecology

## 2015-05-26 DIAGNOSIS — Z1231 Encounter for screening mammogram for malignant neoplasm of breast: Secondary | ICD-10-CM | POA: Insufficient documentation

## 2015-05-26 IMAGING — MG MM DIGITAL SCREENING BILATERAL
4 series · 4 of 4 positions shown · non-contrast
Comparison: Previous exam(s).

CLINICAL DATA: Screening.

EXAM:
DIGITAL SCREENING BILATERAL MAMMOGRAM WITH CAD

[R MLO]
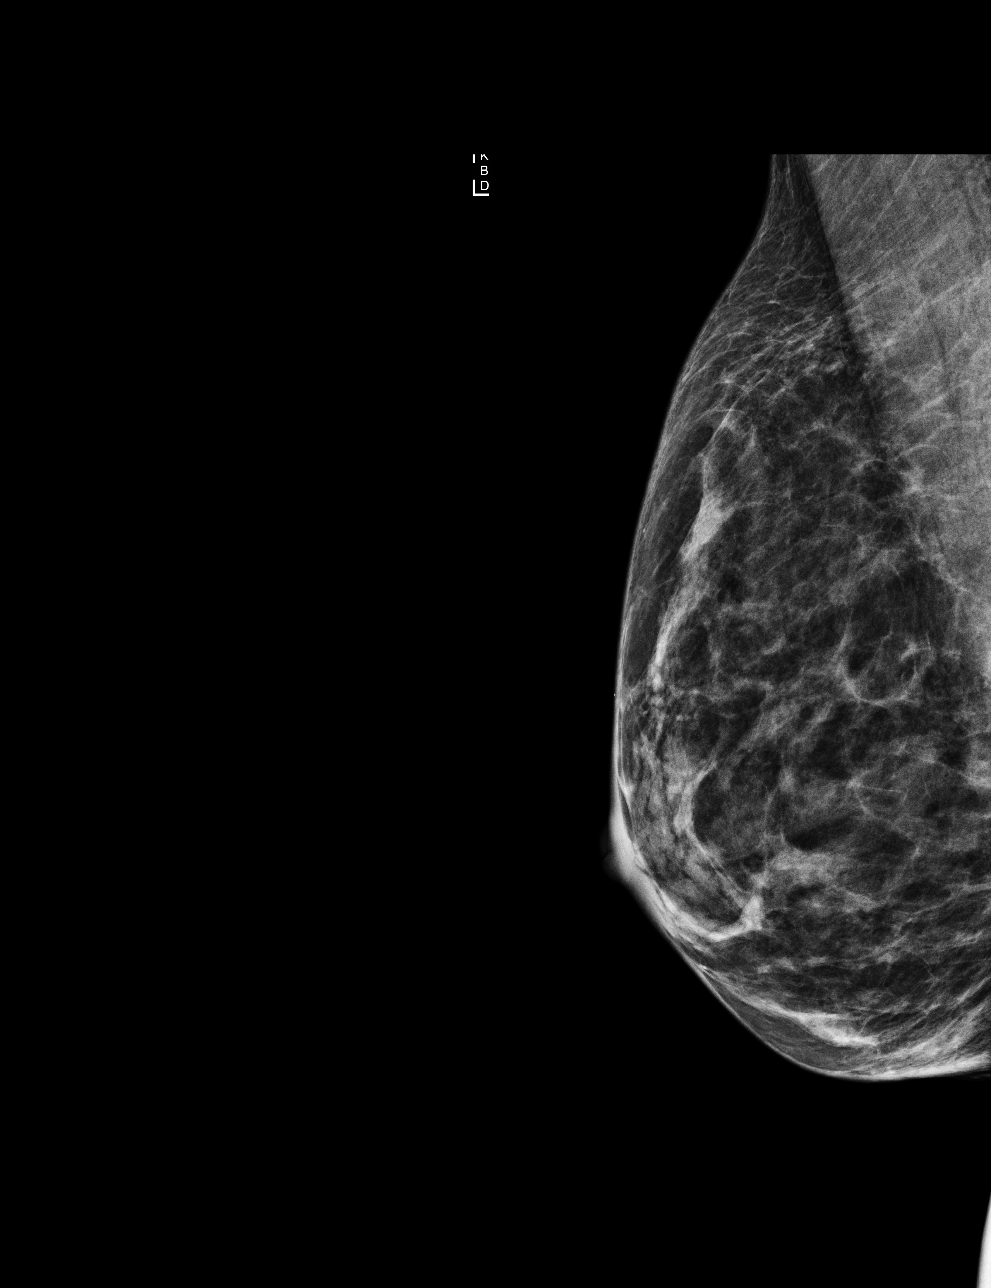

[R CC]
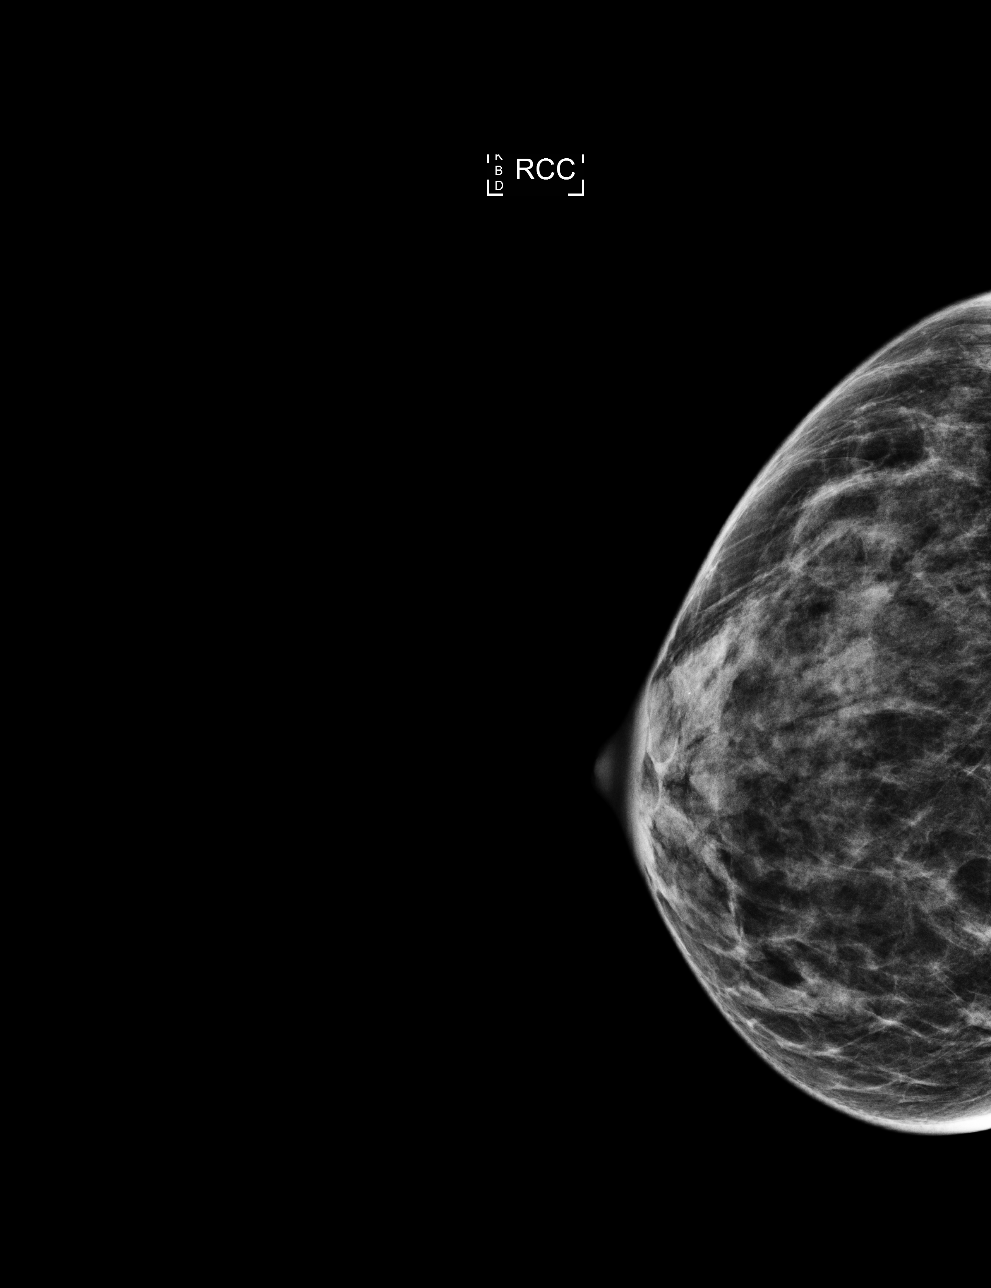

[L CC]
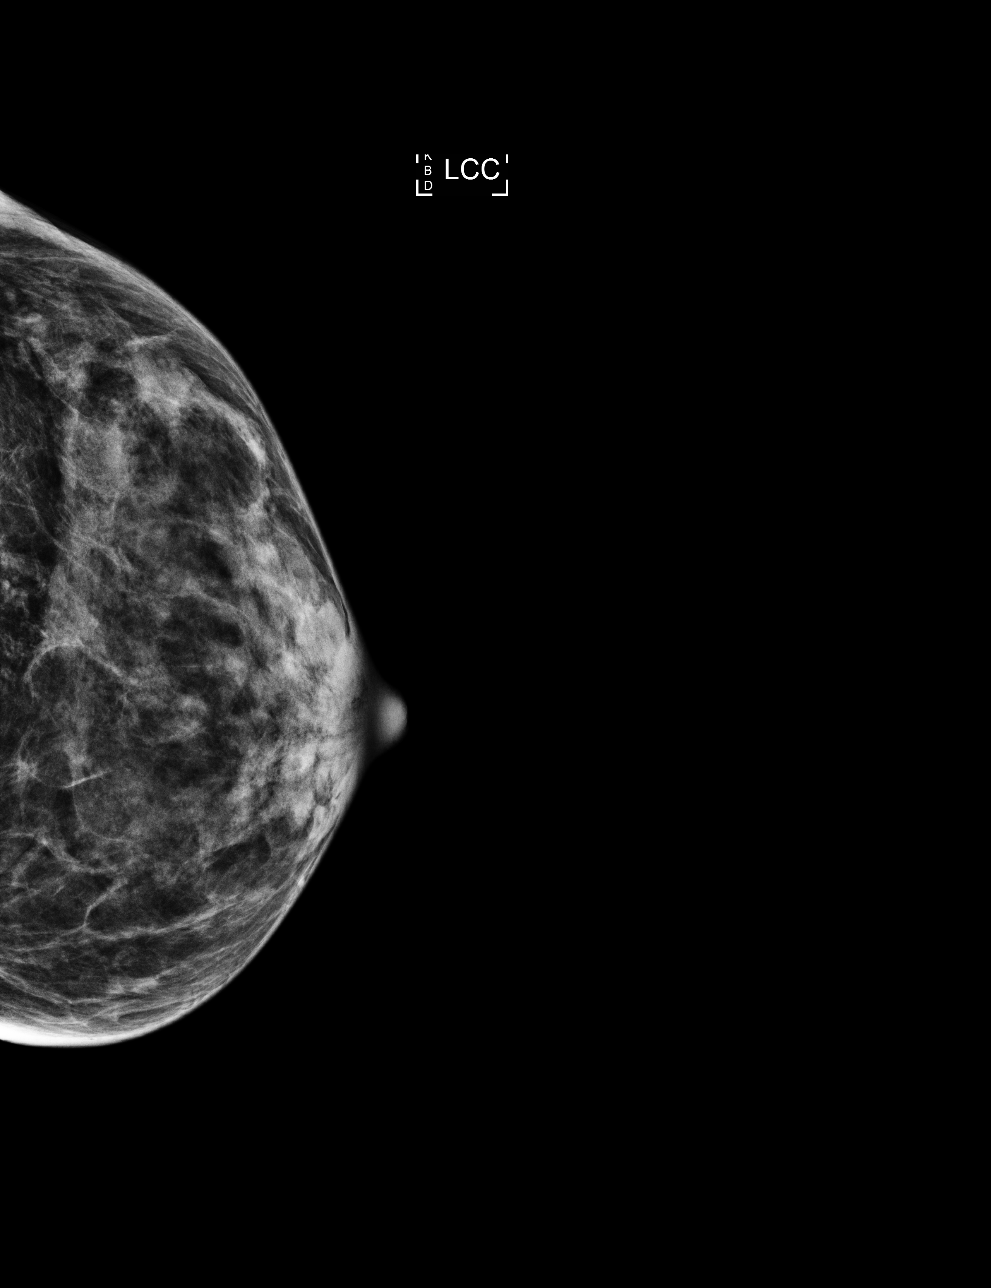

[L MLO]
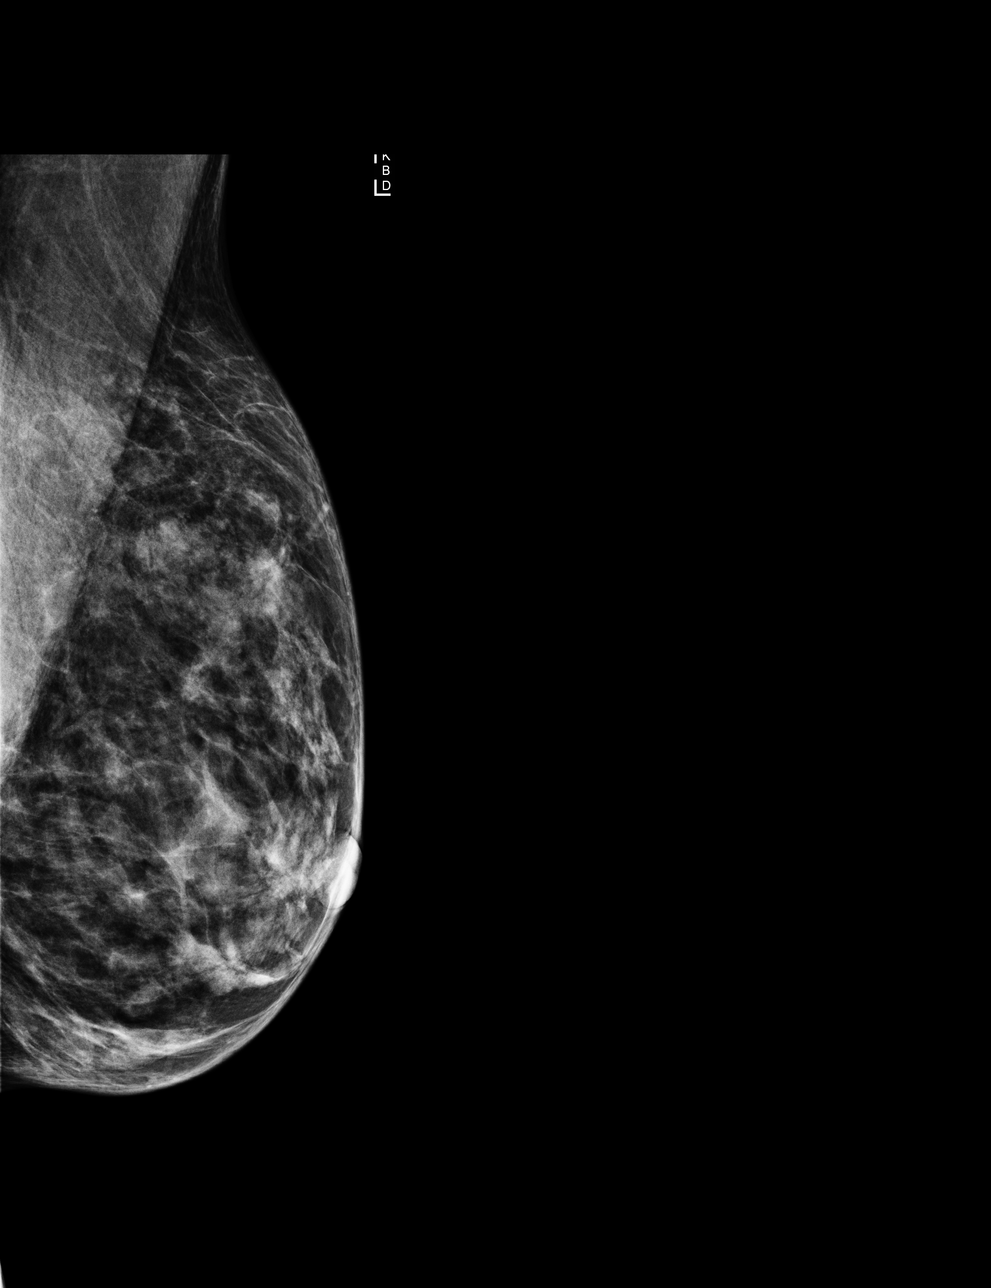

[4 of 4 positions shown; findings below may reference images not displayed]

ACR Breast Density Category c: The breast tissue is heterogeneously
dense, which may obscure small masses.
FINDINGS: There are no findings suspicious for malignancy. Images were
processed with CAD.
IMPRESSION: No mammographic evidence of malignancy. A result letter of this
screening mammogram will be mailed directly to the patient.

RECOMMENDATION:
Screening mammogram in one year. (Code:[0J])

BI-RADS CATEGORY  1: Negative.

## 2016-07-11 ENCOUNTER — Other Ambulatory Visit: Payer: Self-pay | Admitting: Gastroenterology

## 2016-07-11 DIAGNOSIS — R1310 Dysphagia, unspecified: Secondary | ICD-10-CM

## 2016-07-13 ENCOUNTER — Other Ambulatory Visit: Payer: BC Managed Care – PPO

## 2016-07-14 ENCOUNTER — Other Ambulatory Visit: Payer: BC Managed Care – PPO

## 2016-07-15 ENCOUNTER — Ambulatory Visit
Admission: RE | Admit: 2016-07-15 | Discharge: 2016-07-15 | Disposition: A | Discharge status: Home / Self Care | Payer: Medicare Other | Source: Ambulatory Visit | Attending: Gastroenterology | Admitting: Gastroenterology

## 2016-07-15 DIAGNOSIS — R1310 Dysphagia, unspecified: Secondary | ICD-10-CM

## 2016-07-15 IMAGING — RF DG ESOPHAGUS
15 series · 15 of 15 positions shown · non-contrast
Comparison: None in PACs

CLINICAL DATA: Dysphagia

EXAM:
ESOPHOGRAM / BARIUM SWALLOW / BARIUM TABLET STUDY
TECHNIQUE: Combined double contrast and single contrast examination performed
using effervescent crystals, thick barium liquid, and thin barium
liquid. The patient was observed with fluoroscopy swallowing a 13 mm
barium sulphate tablet.
FLUOROSCOPY TIME:  Radiation Exposure Index (as provided by the
fluoroscopic device): 25 dGy cm2
Number of Acquired Images:  15

[Series 1: run · 1 of 1 slices shown (1 of 15)]
[im 1/1]
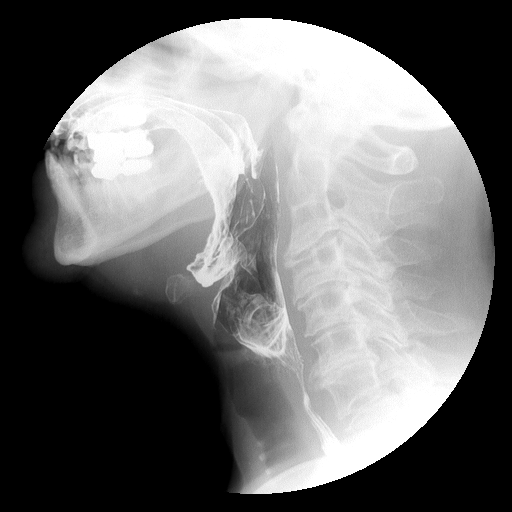

[Series 2: run · 1 of 1 slices shown (2 of 15)]
[im 1/1]
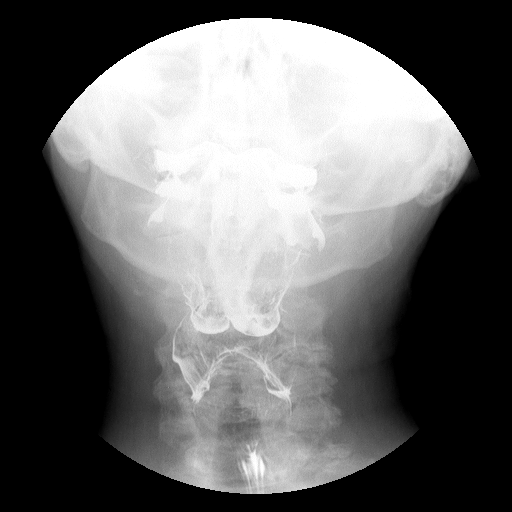

[Series 3: run · 1 of 1 slices shown (3 of 15)]
[im 1/1]
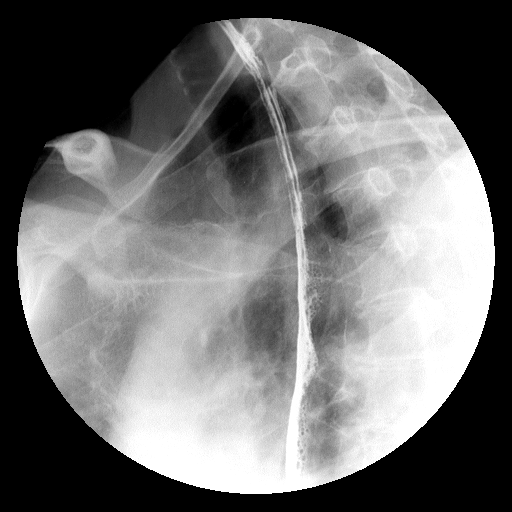

[Series 4: run · 1 of 1 slices shown (4 of 15)]
[im 1/1]
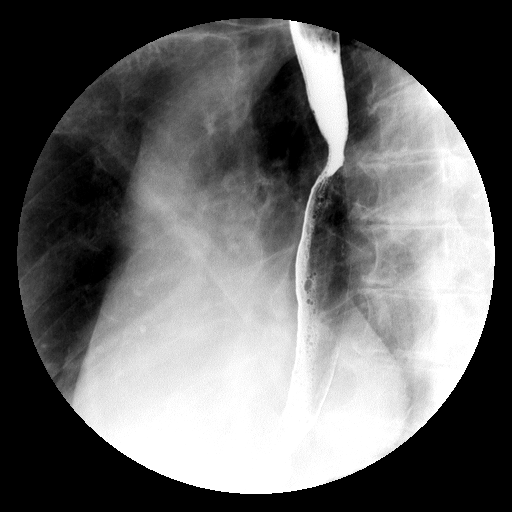

[Series 5: run · 1 of 1 slices shown (5 of 15)]
[im 1/1]
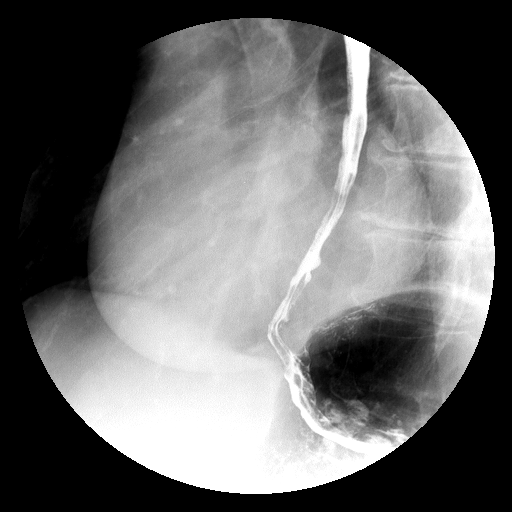

[Series 6: run · 1 of 1 slices shown (6 of 15)]
[im 1/1]
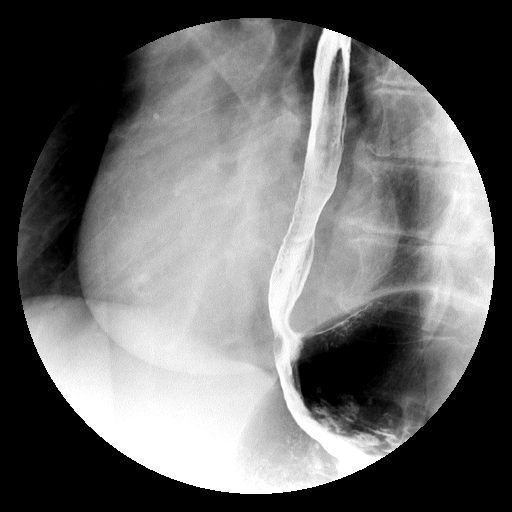

[Series 7: run · 1 of 1 slices shown (7 of 15)]
[im 1/1]
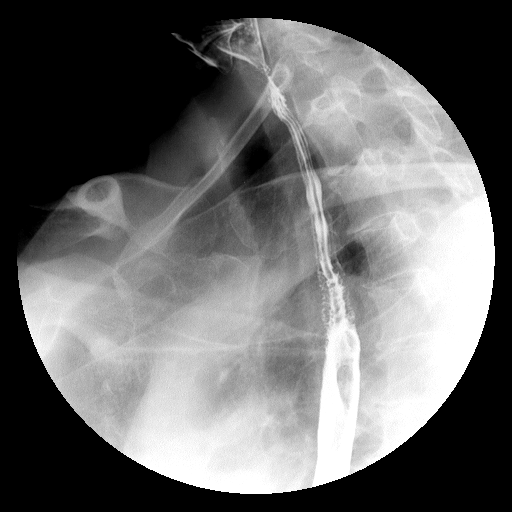

[Series 8: run · 1 of 1 slices shown (8 of 15)]
[im 1/1]
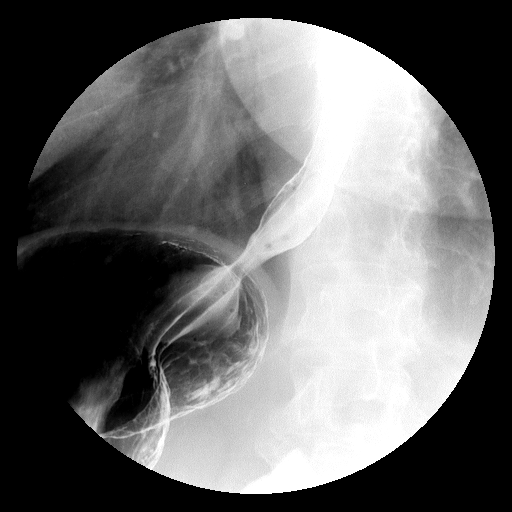

[Series 9: run · 1 of 1 slices shown (9 of 15)]
[im 1/1]
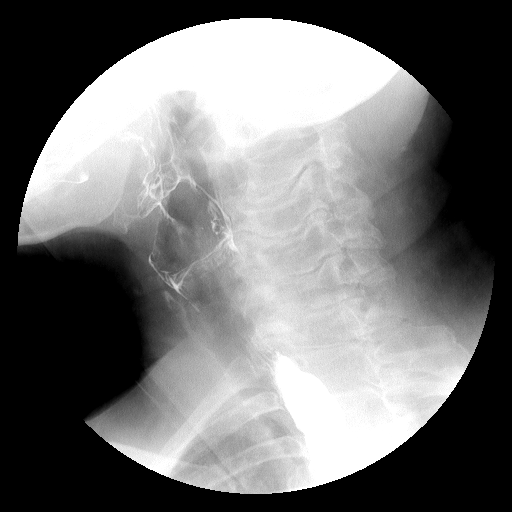

[Series 10: run · 1 of 1 slices shown (10 of 15)]
[im 1/1]
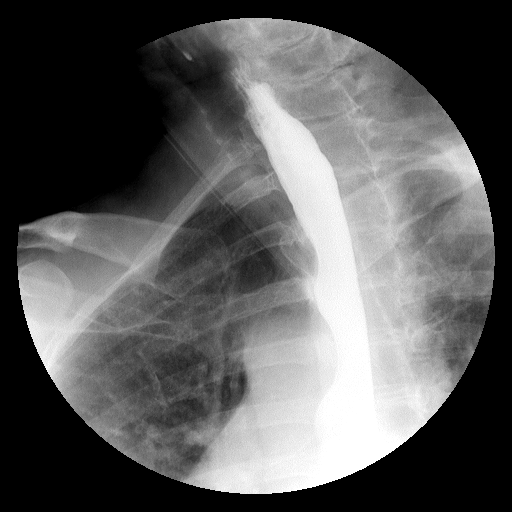

[Series 11: run · 1 of 1 slices shown (11 of 15)]
[im 1/1]
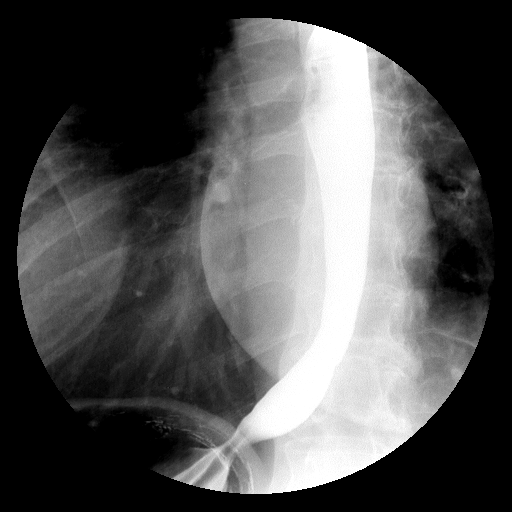

[Series 12: run · 1 of 1 slices shown (12 of 15)]
[im 1/1]
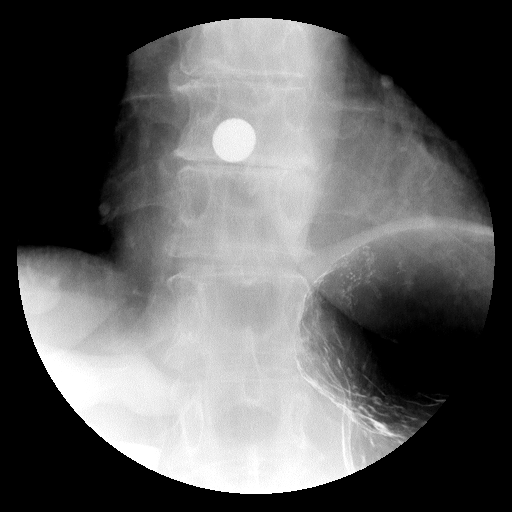

[Series 13: run · 1 of 1 slices shown (13 of 15)]
[im 1/1]
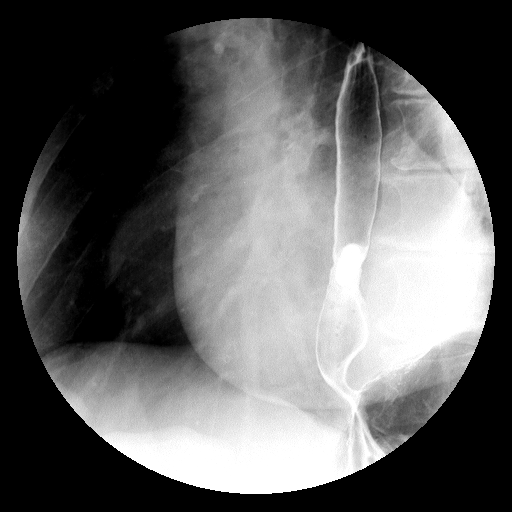

[Series 14: run · 1 of 1 slices shown (14 of 15)]
[im 1/1]
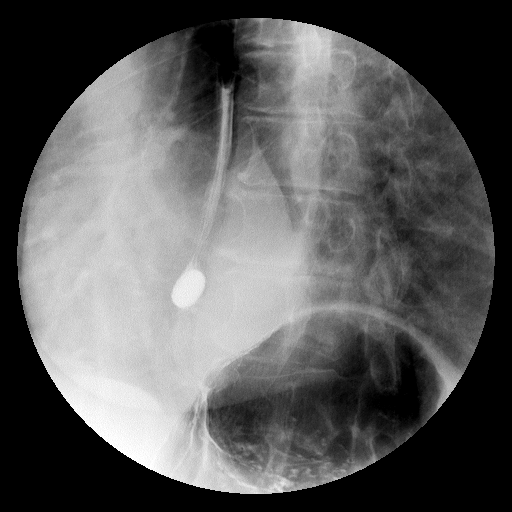

[Series 15: run · 1 of 1 slices shown (15 of 15)]
[im 1/1]
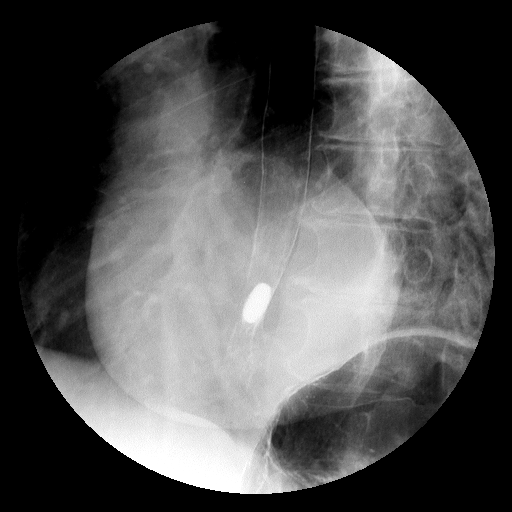

[15 of 15 positions shown; findings below may reference images not displayed]

FINDINGS: The patient ingested the thick and thin barium and the gas-forming
crystals without difficulty. The hypopharynx appeared normal. There
was transient laryngeal penetration which elicited a cough reflex
and resulted in clearing. This did not recur. A mild impression was
made upon the posterior wall of the proximal cervical esophagus due
to anterior endplate osteophytes at C6-7.

The thoracic esophagus distended fairly well in its proximal and
midportions. More distally the distensibility was decreased. Does
significant reflux was observed. The barium tablet hung in the
distal third of the esophagus were mild long segment narrowing was
observed. Following additional of menstruation of barium and water
which did not dislodged the tablet, the tablet was allowed to
dissolve.
IMPRESSION: Long segment moderate narrowing of the distal third of the thoracic
esophagus without focal lesion or evidence of diffuse esophagitis.
Direct visualization is recommended with assessment of the patient
for possible dilation is recommended.

Transient laryngeal penetration of the barium which elicited a
prompt cough reflex and resulted in clearing of the barium.

## 2017-09-07 ENCOUNTER — Other Ambulatory Visit: Payer: Self-pay | Admitting: Obstetrics and Gynecology

## 2017-09-07 DIAGNOSIS — R928 Other abnormal and inconclusive findings on diagnostic imaging of breast: Secondary | ICD-10-CM

## 2017-09-12 ENCOUNTER — Ambulatory Visit
Admission: RE | Admit: 2017-09-12 | Discharge: 2017-09-12 | Disposition: A | Discharge status: Home / Self Care | Payer: Medicare Other | Source: Ambulatory Visit | Attending: Obstetrics and Gynecology | Admitting: Obstetrics and Gynecology

## 2017-09-12 ENCOUNTER — Ambulatory Visit: Payer: Medicare Other

## 2017-09-12 DIAGNOSIS — R928 Other abnormal and inconclusive findings on diagnostic imaging of breast: Secondary | ICD-10-CM

## 2017-09-12 IMAGING — MG 2D DIGITAL DIAGNOSTIC UNILATERAL LEFT MAMMOGRAM WITH CAD AND ADJ
9 series · 9 of 21 positions shown · non-contrast
Comparison: Previous exam(s).

CLINICAL DATA: 66-year-old female recalled from screening mammogram
dated [DATE] for possible left breast distortion.

EXAM:
2D DIGITAL DIAGNOSTIC UNILATERAL LEFT MAMMOGRAM WITH CAD AND ADJUNCT
TOMO

[L ML]
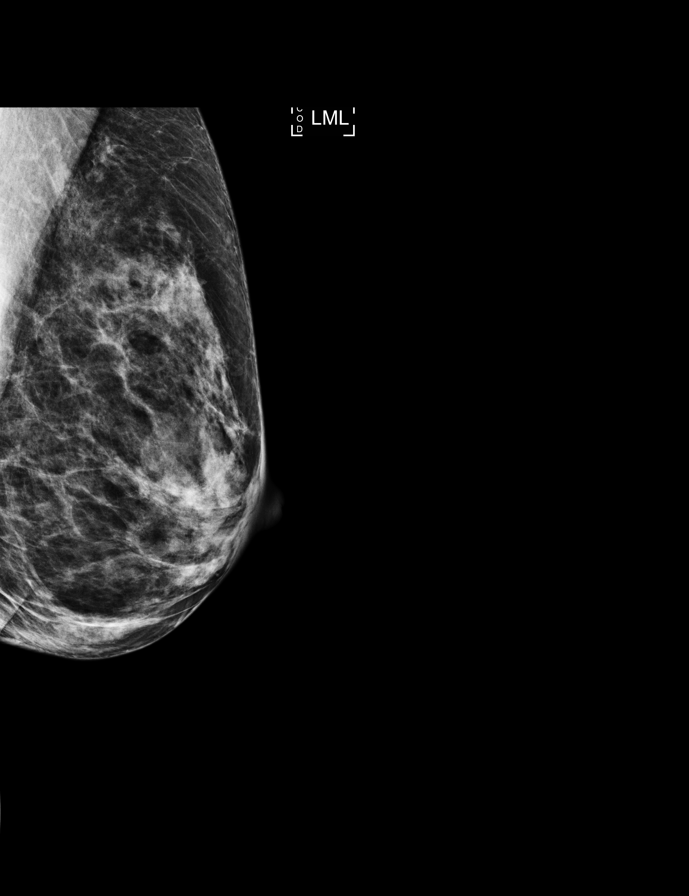

[L CC synth-2D]
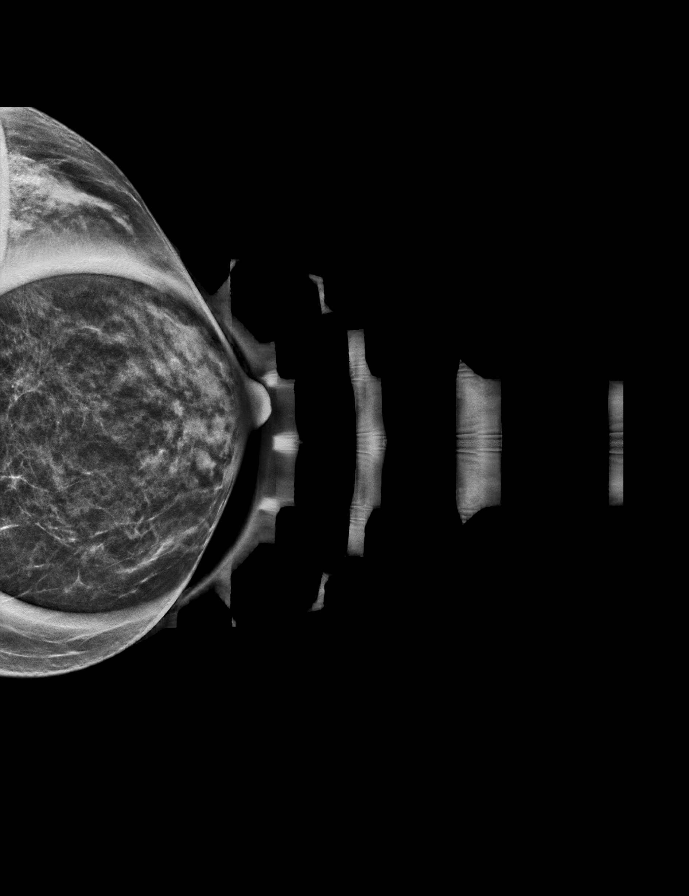

[L MLO]
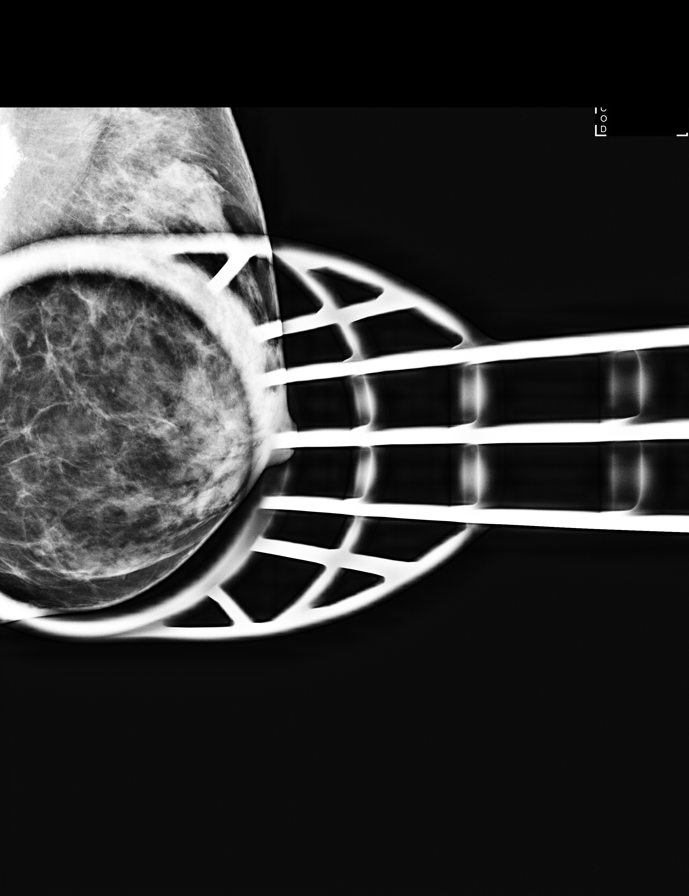

[L ML synth-2D]
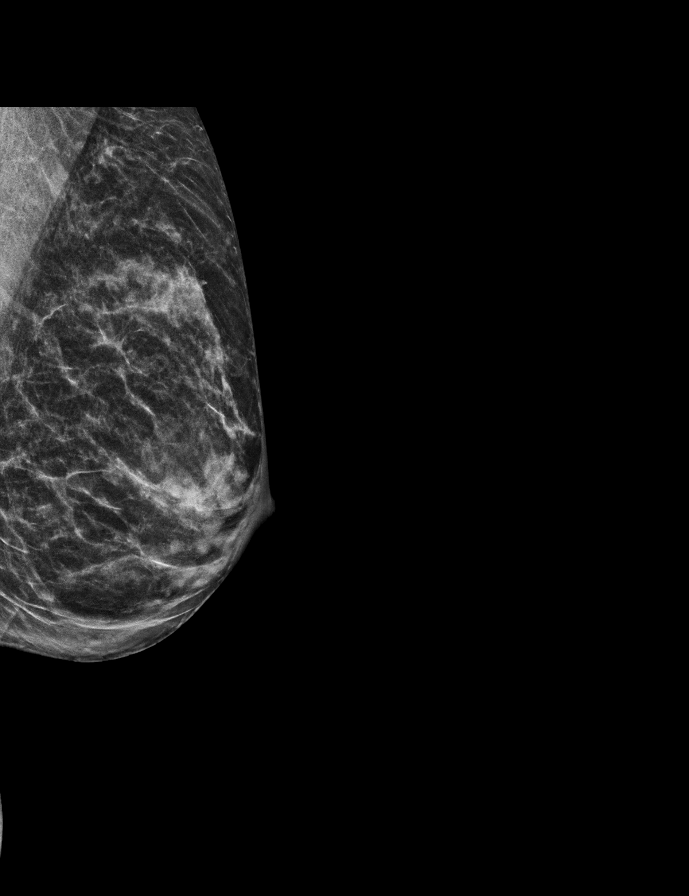

[L MLO synth-2D]
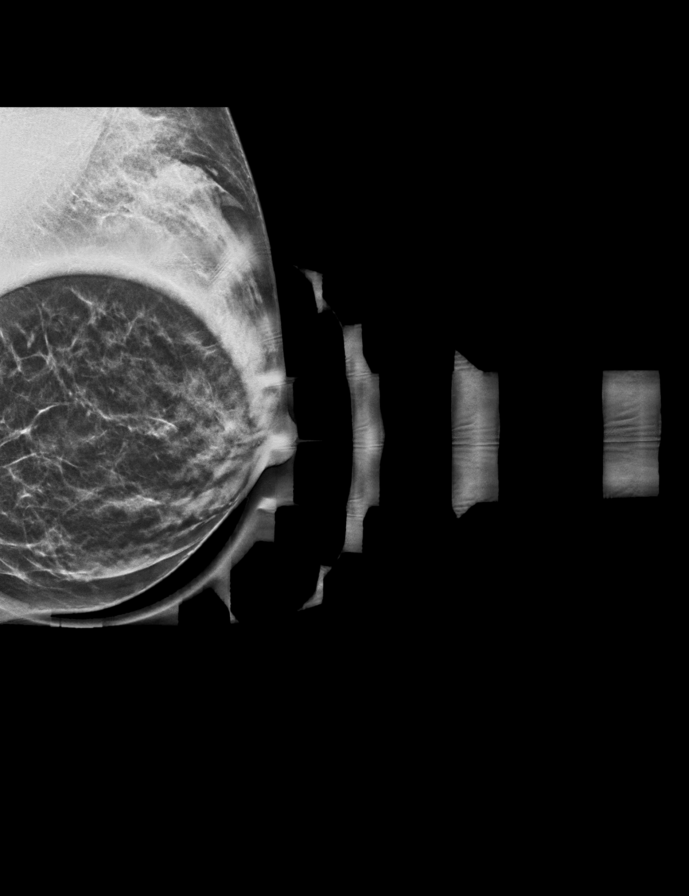

[L CC]
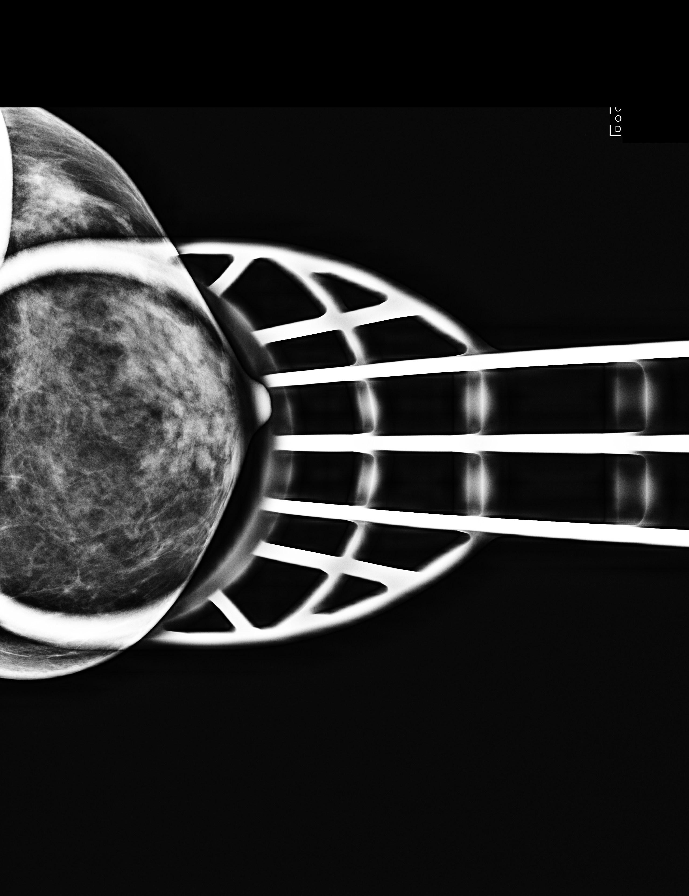

[L MLO tomo · tomo slice 19/37.0]
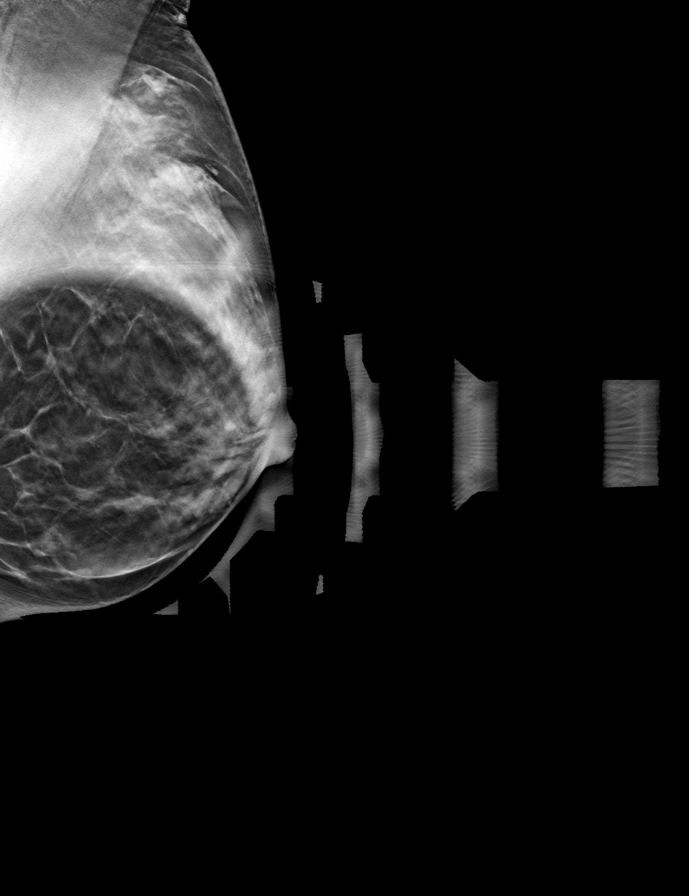

[L ML tomo · tomo slice 23/45.0]
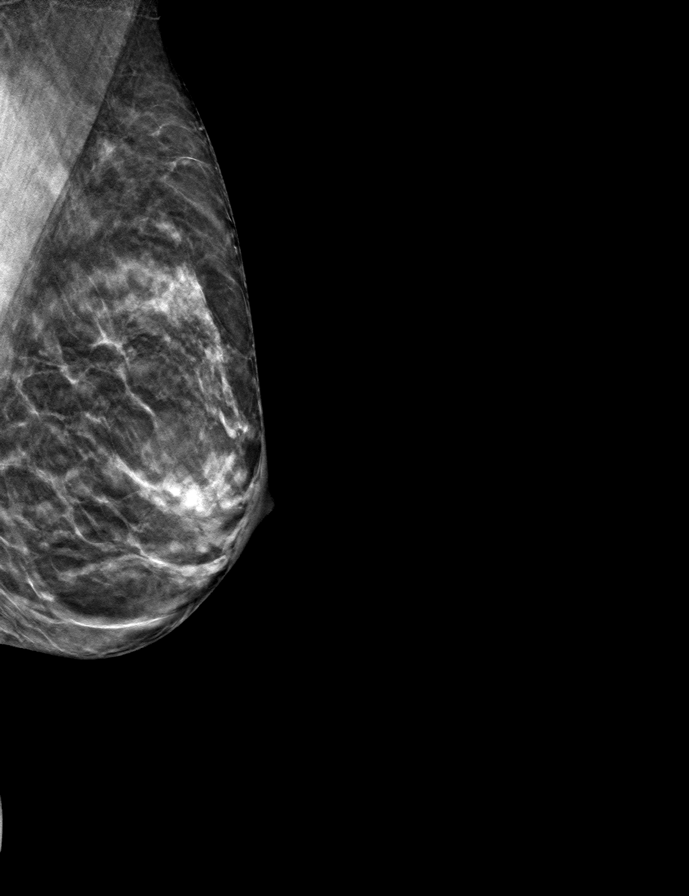

[L CC tomo · tomo slice 21/40.0]
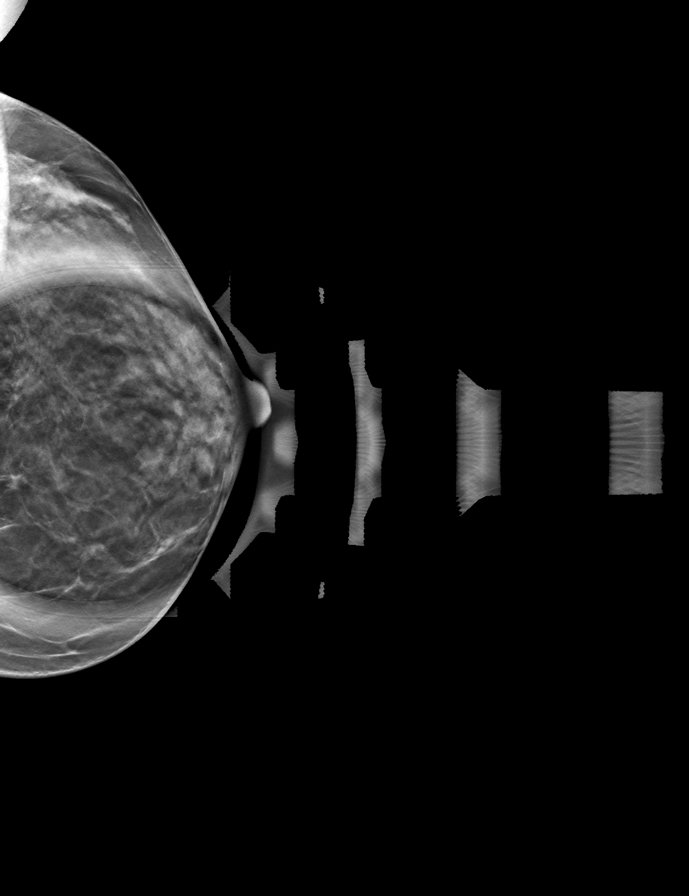

[9 of 21 positions shown; findings below may reference images not displayed]

ACR Breast Density Category c: The breast tissue is heterogeneously
dense, which may obscure small masses.
FINDINGS: The previously described, questionable distortion in the inferior
central left breast resolves into well dispersed fibroglandular
tissue on today's additional views.

Mammographic images were processed with CAD.
IMPRESSION: No mammographic evidence of malignancy.

RECOMMENDATION:
Screening mammogram in one year.(Code:[AO])

I have discussed the findings and recommendations with the patient.
Results were also provided in writing at the conclusion of the
visit. If applicable, a reminder letter will be sent to the patient
regarding the next appointment.

BI-RADS CATEGORY  1: Negative.

## 2018-09-10 ENCOUNTER — Other Ambulatory Visit: Payer: Self-pay | Admitting: Obstetrics and Gynecology

## 2018-09-10 DIAGNOSIS — N6489 Other specified disorders of breast: Secondary | ICD-10-CM

## 2018-09-14 ENCOUNTER — Ambulatory Visit
Admission: RE | Admit: 2018-09-14 | Discharge: 2018-09-14 | Disposition: A | Discharge status: Home / Self Care | Payer: Medicare Other | Source: Ambulatory Visit | Attending: Obstetrics and Gynecology | Admitting: Obstetrics and Gynecology

## 2018-09-14 DIAGNOSIS — N6489 Other specified disorders of breast: Secondary | ICD-10-CM

## 2018-09-14 IMAGING — US ULTRASOUND LEFT BREAST LIMITED
1 series · 3 of 3 positions shown · non-contrast
Comparison: Previous exam(s).

CLINICAL DATA: Screening recall for a left breast asymmetry.

EXAM:
DIGITAL DIAGNOSTIC LEFT MAMMOGRAM WITH CAD AND TOMO
ULTRASOUND LEFT BREAST

[Series 1: ultrasound left breast limited · 0.05mm/px · 3 of 3 slices shown]
[im 1/3]
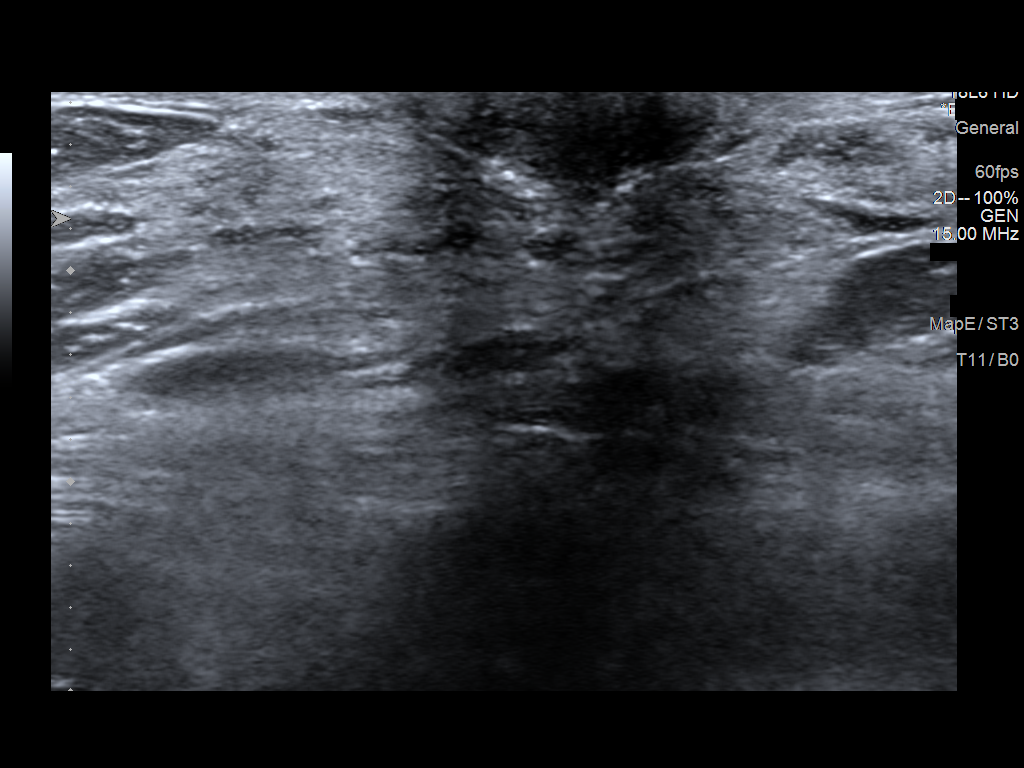
[im 2/3]
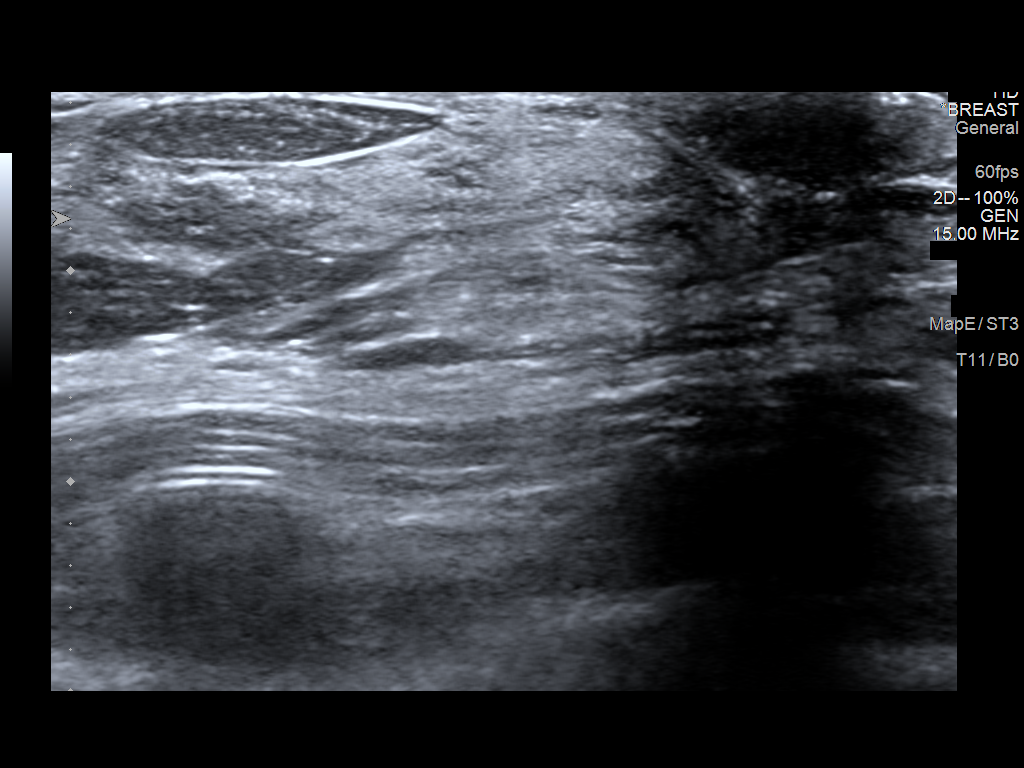
[im 3/3]
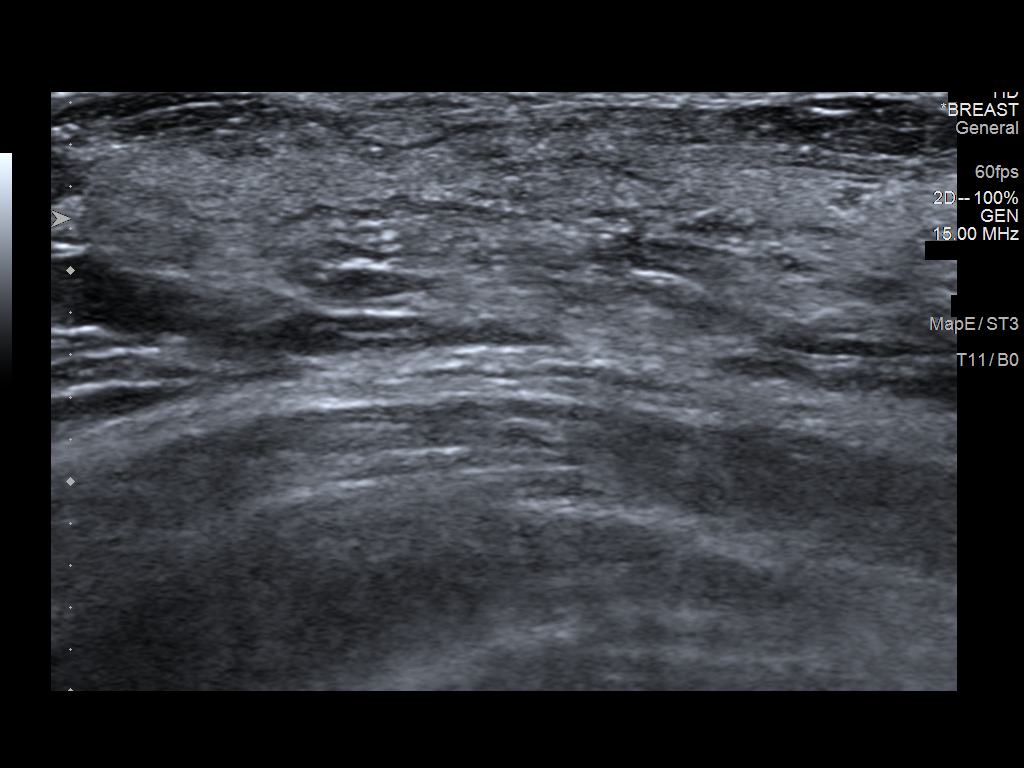

[3 of 3 positions shown; findings below may reference images not displayed]

ACR Breast Density Category c: The breast tissue is heterogeneously
dense, which may obscure small masses.
FINDINGS: There is no definite persistent asymmetry on the spot compression
tomosynthesis images through the retroareolar left breast. However,
due to the density of the patient's breast tissue, ultrasound will
be performed for further evaluation.

Mammographic images were processed with CAD.

On physical exam, no suspicious palpable masses are identified in
the retroareolar or periareolar left breast.

Targeted ultrasound is performed, showing normal dense
fibroglandular tissue in the periareolar and retroareolar left
breast. No suspicious masses or areas of shadowing are identified.
IMPRESSION: There are no persistent suspicious mammographic or targeted
sonographic abnormalities in the left breast.

RECOMMENDATION:
Screening mammogram in one year.(Code:[IE])

I have discussed the findings and recommendations with the patient.
Results were also provided in writing at the conclusion of the
visit. If applicable, a reminder letter will be sent to the patient
regarding the next appointment.

BI-RADS CATEGORY  1: Negative.

## 2018-09-14 IMAGING — MG DIGITAL DIAGNOSTIC UNILATERAL LEFT MAMMOGRAM WITH TOMO AND CAD
6 series · 6 of 18 positions shown · non-contrast
Comparison: Previous exam(s).

CLINICAL DATA: Screening recall for a left breast asymmetry.

EXAM:
DIGITAL DIAGNOSTIC LEFT MAMMOGRAM WITH CAD AND TOMO
ULTRASOUND LEFT BREAST

[L MLO synth-2D]
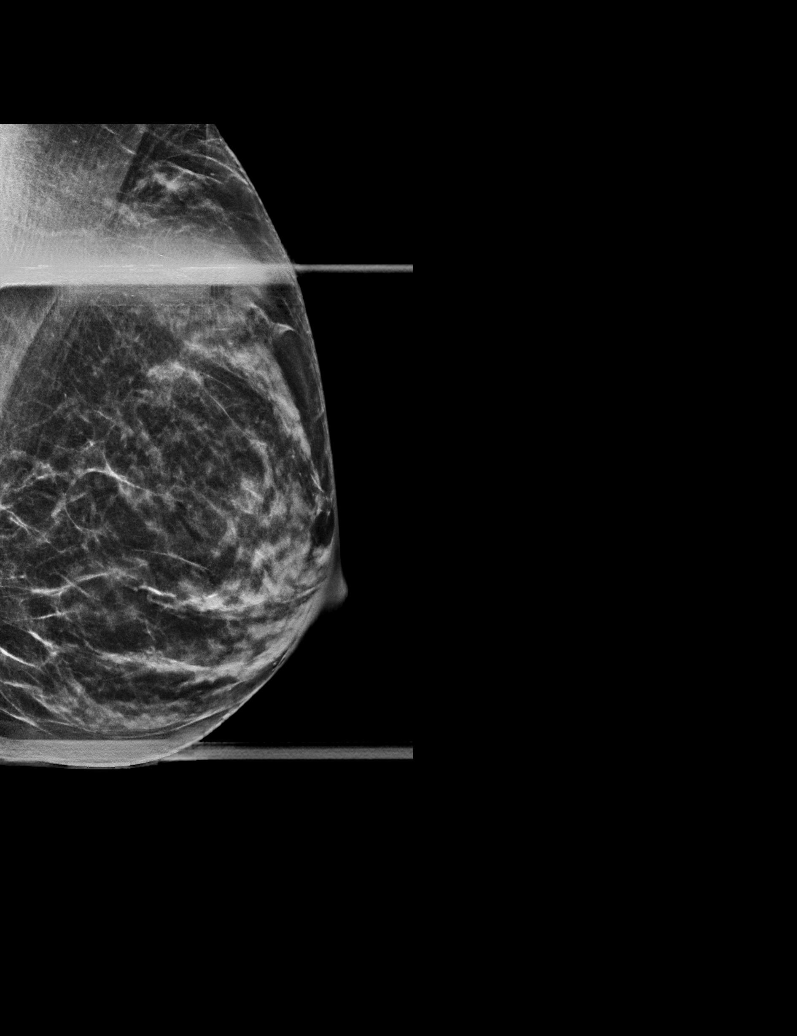

[L CC synth-2D]
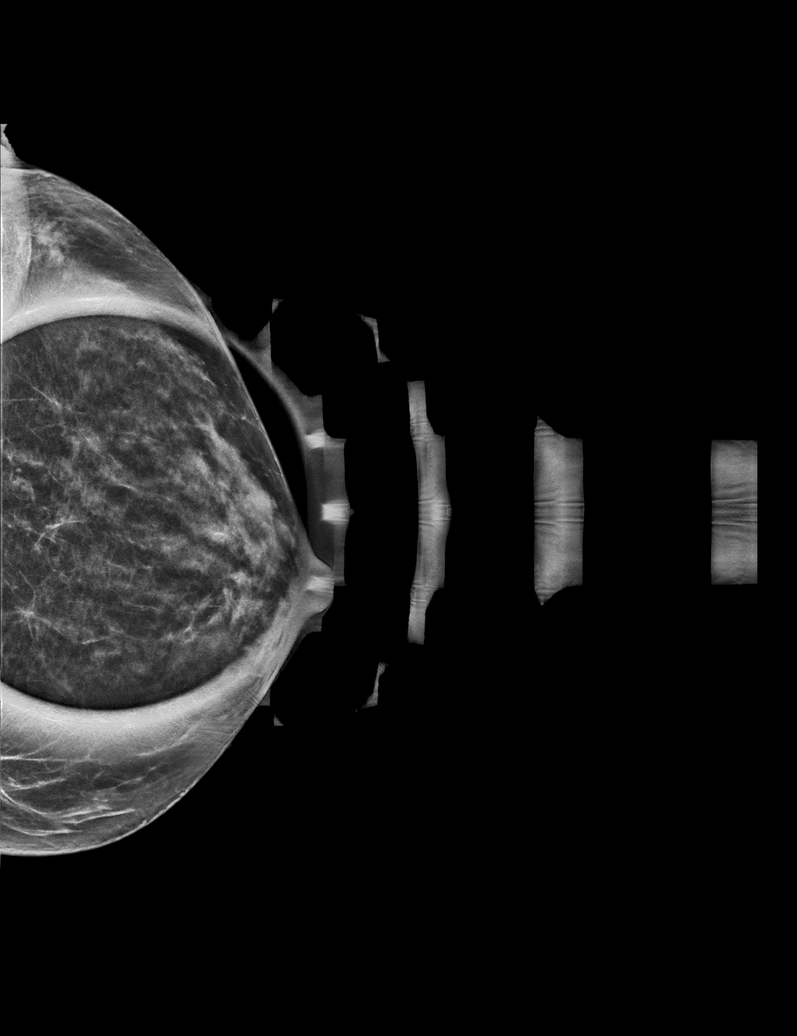

[L ML synth-2D]
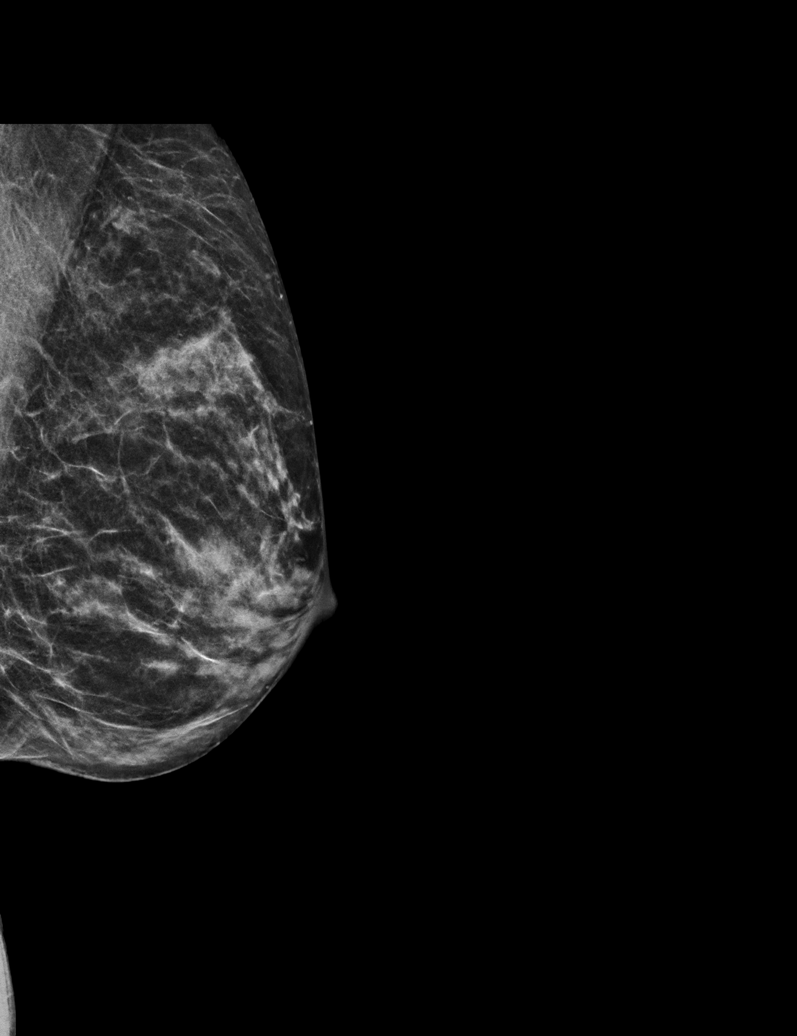

[L ML tomo · tomo slice 23/44.0]
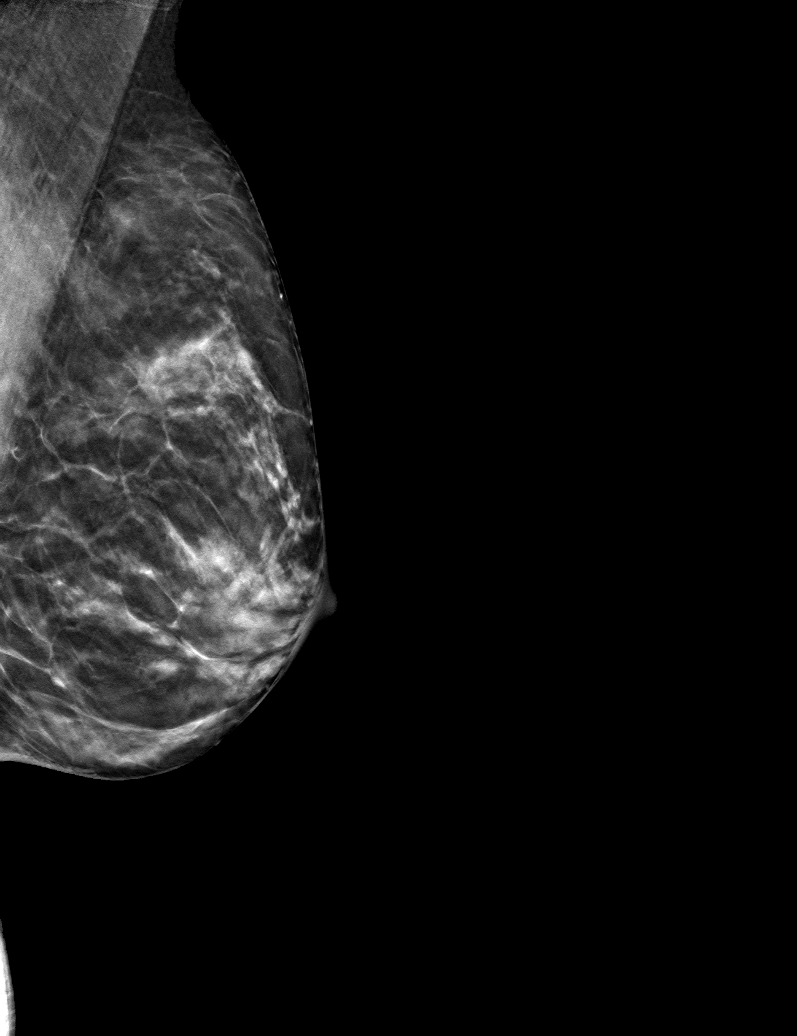

[L CC tomo · tomo slice 21/42.0]
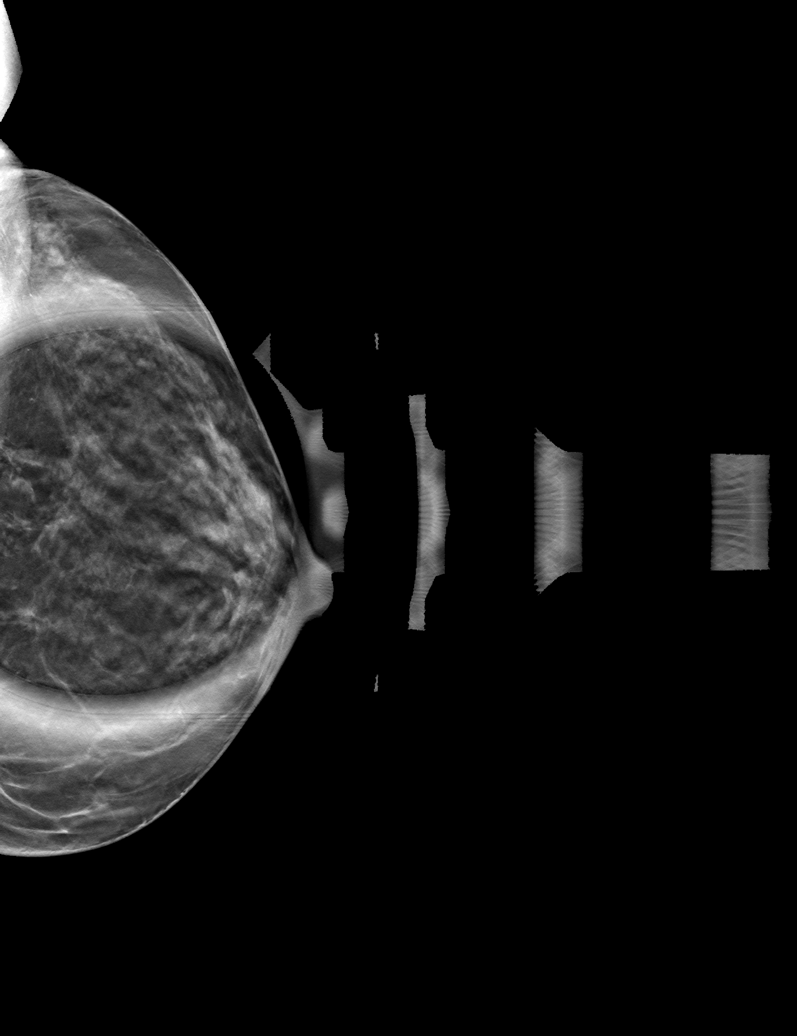

[L MLO tomo · tomo slice 25/48.0]
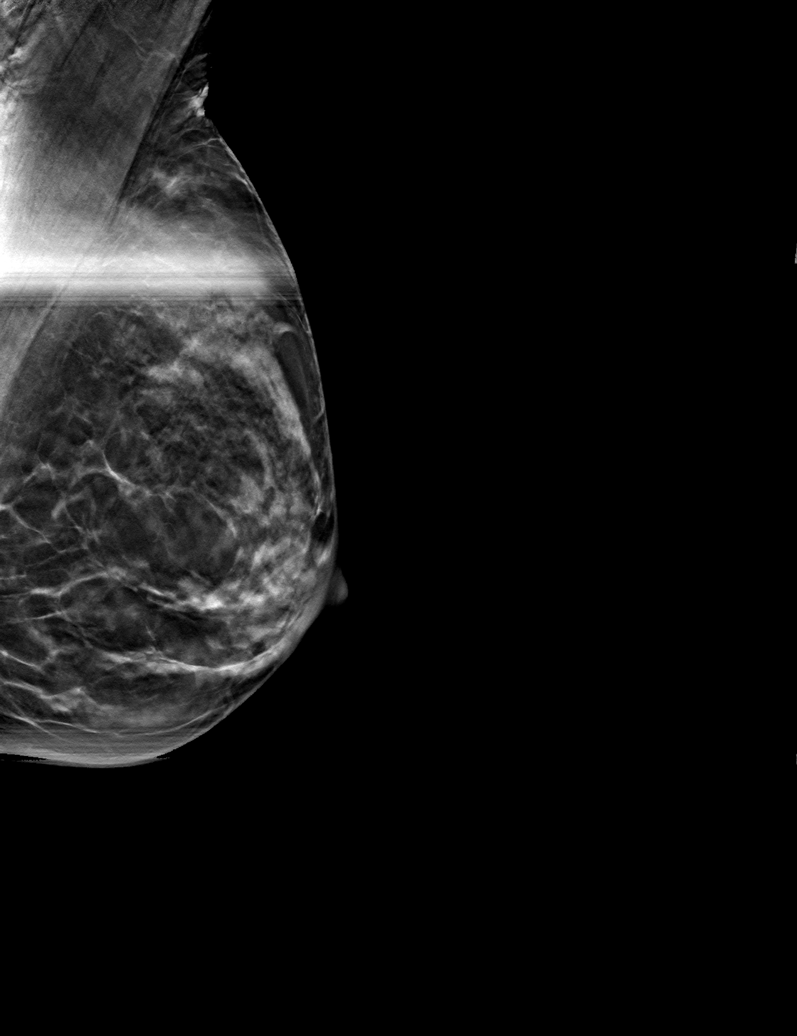

[6 of 18 positions shown; findings below may reference images not displayed]

ACR Breast Density Category c: The breast tissue is heterogeneously
dense, which may obscure small masses.
FINDINGS: There is no definite persistent asymmetry on the spot compression
tomosynthesis images through the retroareolar left breast. However,
due to the density of the patient's breast tissue, ultrasound will
be performed for further evaluation.

Mammographic images were processed with CAD.

On physical exam, no suspicious palpable masses are identified in
the retroareolar or periareolar left breast.

Targeted ultrasound is performed, showing normal dense
fibroglandular tissue in the periareolar and retroareolar left
breast. No suspicious masses or areas of shadowing are identified.
IMPRESSION: There are no persistent suspicious mammographic or targeted
sonographic abnormalities in the left breast.

RECOMMENDATION:
Screening mammogram in one year.(Code:[IE])

I have discussed the findings and recommendations with the patient.
Results were also provided in writing at the conclusion of the
visit. If applicable, a reminder letter will be sent to the patient
regarding the next appointment.

BI-RADS CATEGORY  1: Negative.

## 2019-09-11 ENCOUNTER — Other Ambulatory Visit: Payer: Self-pay | Admitting: Obstetrics and Gynecology

## 2019-09-11 DIAGNOSIS — Z1231 Encounter for screening mammogram for malignant neoplasm of breast: Secondary | ICD-10-CM

## 2019-10-28 ENCOUNTER — Other Ambulatory Visit: Payer: Self-pay

## 2019-10-28 ENCOUNTER — Ambulatory Visit
Admission: RE | Admit: 2019-10-28 | Discharge: 2019-10-28 | Disposition: A | Discharge status: Home / Self Care | Payer: Medicare Other | Source: Ambulatory Visit | Attending: Obstetrics and Gynecology | Admitting: Obstetrics and Gynecology

## 2019-10-28 DIAGNOSIS — Z1231 Encounter for screening mammogram for malignant neoplasm of breast: Secondary | ICD-10-CM

## 2019-10-28 IMAGING — MG DIGITAL SCREENING BILAT W/ TOMO W/ CAD
8 series · 9 of 24 positions shown · non-contrast
Comparison: Previous exam(s).

CLINICAL DATA: Screening.

EXAM:
DIGITAL SCREENING BILATERAL MAMMOGRAM WITH TOMO AND CAD

[R CC synth-2D]
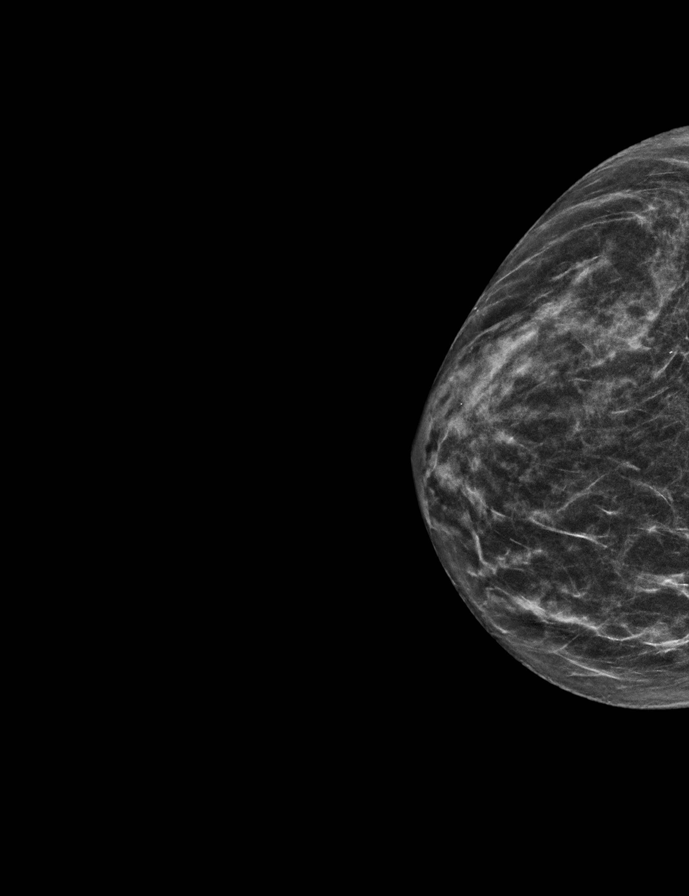

[L CC synth-2D]
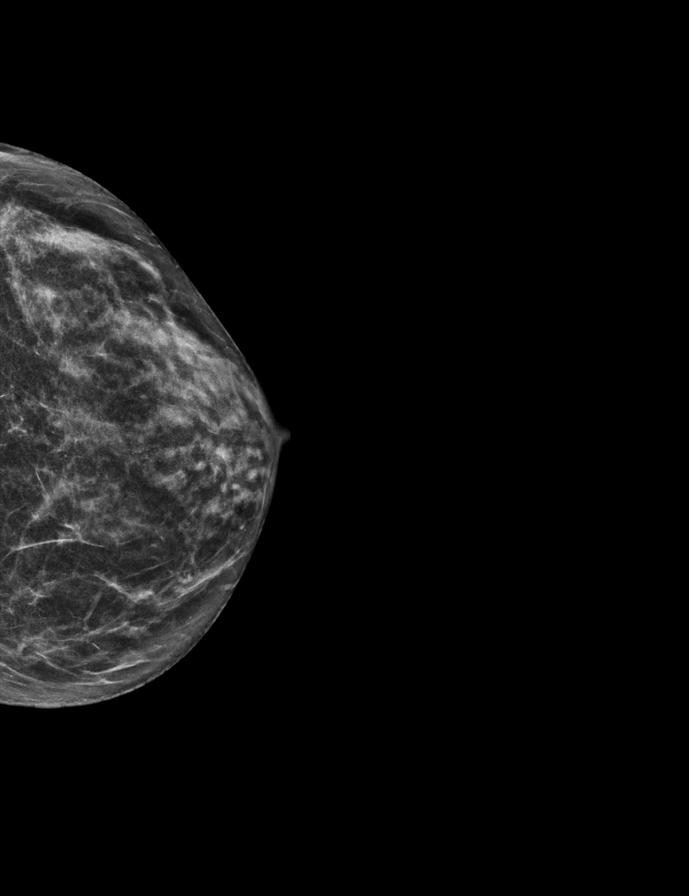

[R MLO synth-2D]
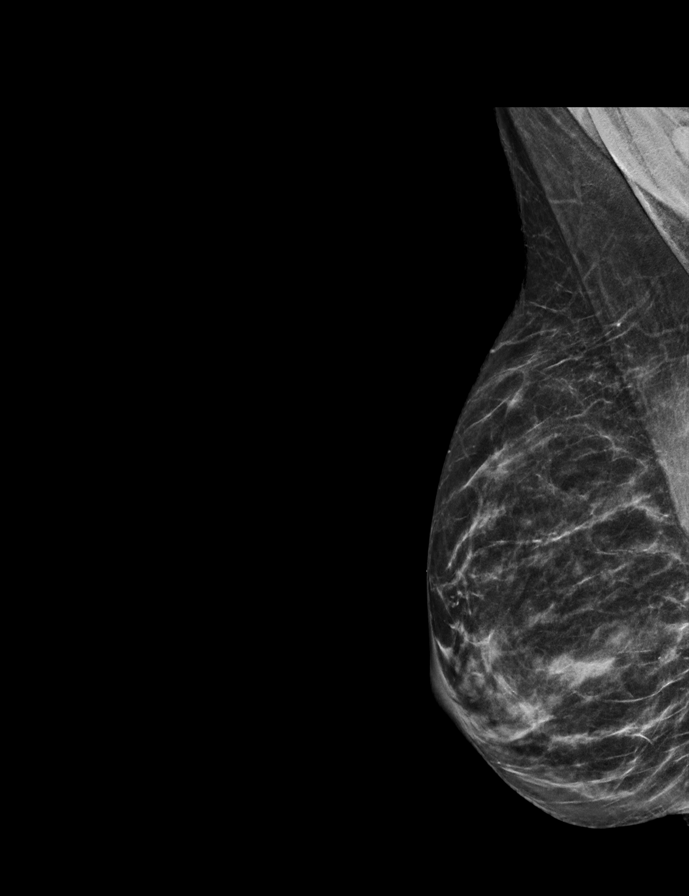

[L MLO synth-2D]
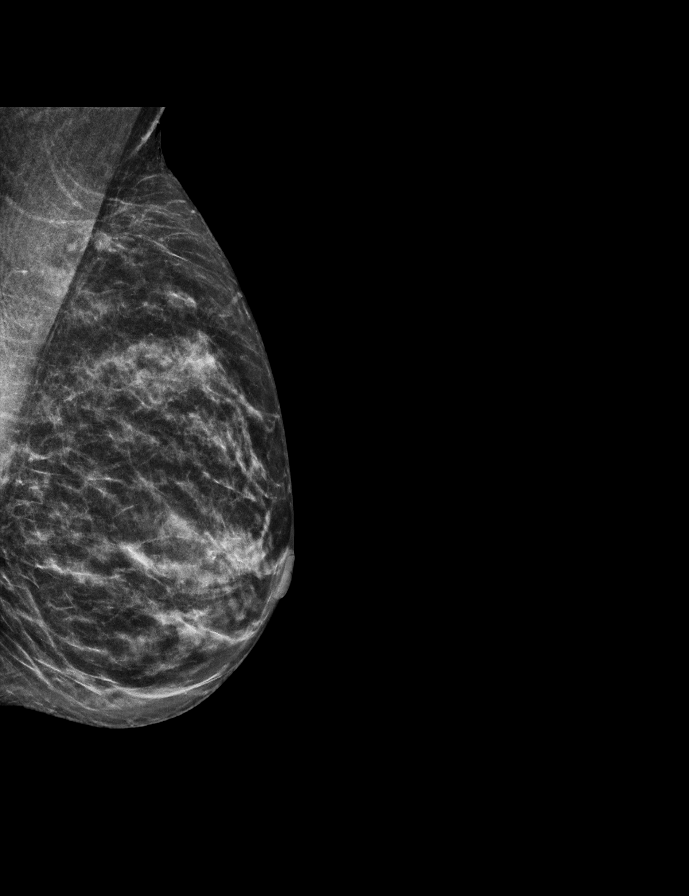

[L CC tomo · 2 of 46 frames shown]
[frame 15/46]
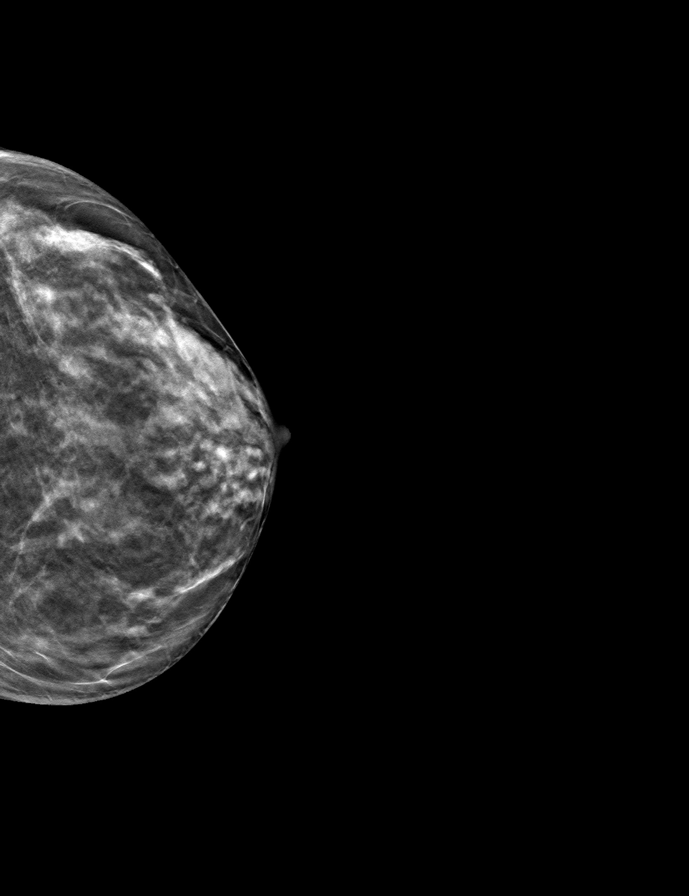
[frame 23/46]
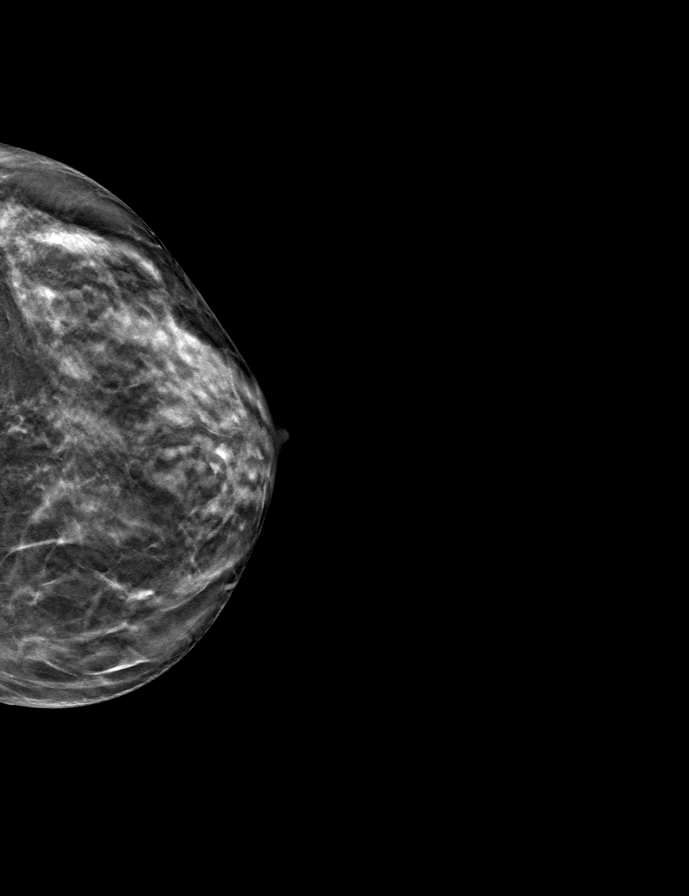

[R CC tomo · tomo slice 23/45.0]
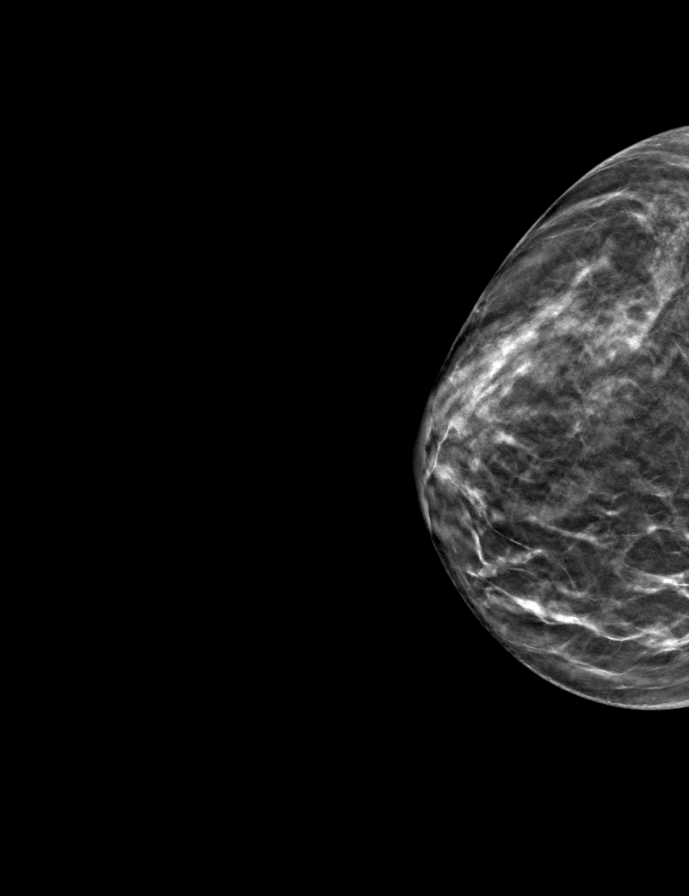

[R MLO tomo · tomo slice 25/50.0]
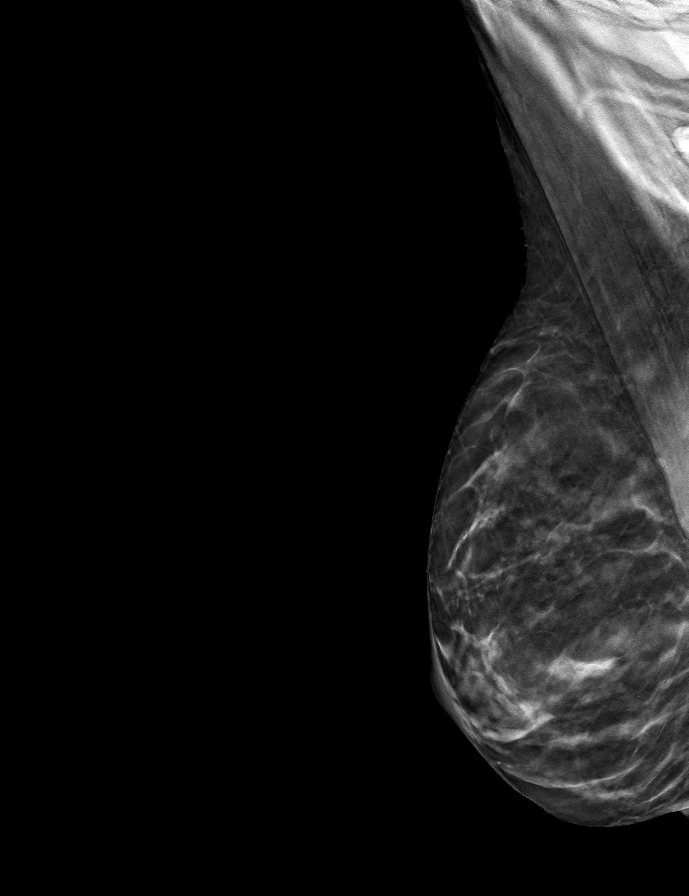

[L MLO tomo · tomo slice 25/49.0]
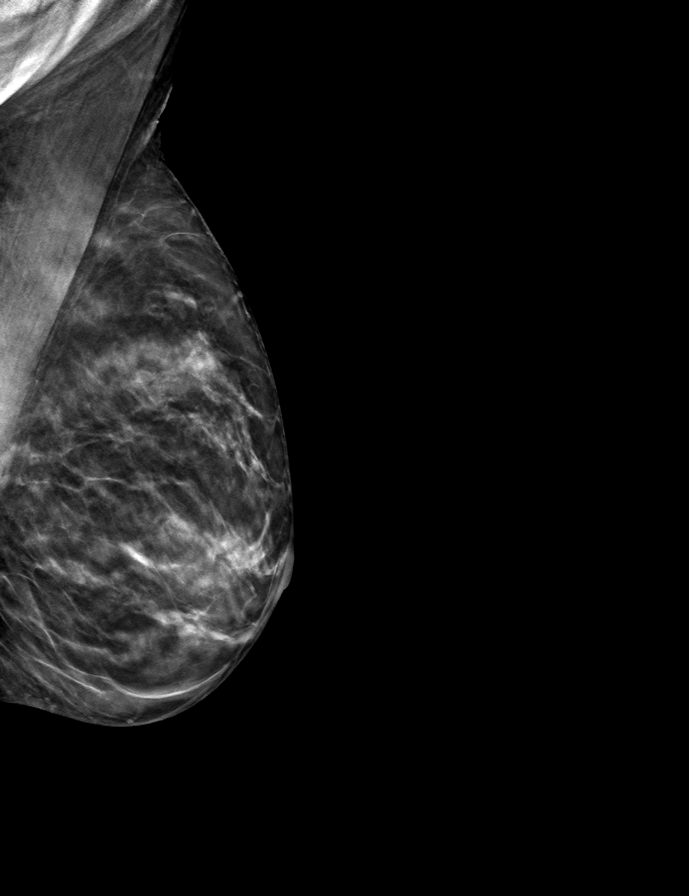

[9 of 24 positions shown; findings below may reference images not displayed]

ACR Breast Density Category b: There are scattered areas of
fibroglandular density.
FINDINGS: There are no findings suspicious for malignancy. Images were
processed with CAD.
IMPRESSION: No mammographic evidence of malignancy. A result letter of this
screening mammogram will be mailed directly to the patient.

RECOMMENDATION:
Screening mammogram in one year. (Code:[TQ])

BI-RADS CATEGORY  1: Negative.

## 2019-10-30 DIAGNOSIS — M79641 Pain in right hand: Secondary | ICD-10-CM | POA: Insufficient documentation

## 2019-10-30 DIAGNOSIS — M189 Osteoarthritis of first carpometacarpal joint, unspecified: Secondary | ICD-10-CM | POA: Insufficient documentation

## 2019-10-30 DIAGNOSIS — M79644 Pain in right finger(s): Secondary | ICD-10-CM | POA: Insufficient documentation

## 2019-10-30 DIAGNOSIS — M65332 Trigger finger, left middle finger: Secondary | ICD-10-CM | POA: Insufficient documentation

## 2019-12-04 DIAGNOSIS — M542 Cervicalgia: Secondary | ICD-10-CM | POA: Insufficient documentation

## 2020-02-20 ENCOUNTER — Ambulatory Visit
Admission: RE | Admit: 2020-02-20 | Discharge: 2020-02-20 | Disposition: A | Discharge status: Home / Self Care | Payer: Medicare Other | Source: Ambulatory Visit | Attending: Internal Medicine | Admitting: Internal Medicine

## 2020-02-20 ENCOUNTER — Other Ambulatory Visit: Payer: Self-pay | Admitting: Internal Medicine

## 2020-02-20 DIAGNOSIS — R509 Fever, unspecified: Secondary | ICD-10-CM

## 2020-02-20 IMAGING — CR DG CHEST 2V
2 series · 2 of 2 positions shown · non-contrast
Comparison: [DATE] [DATE], [DATE]. [DATE]

CLINICAL DATA: Fever and chills

EXAM:
CHEST - 2 VIEW

[w chest pa]
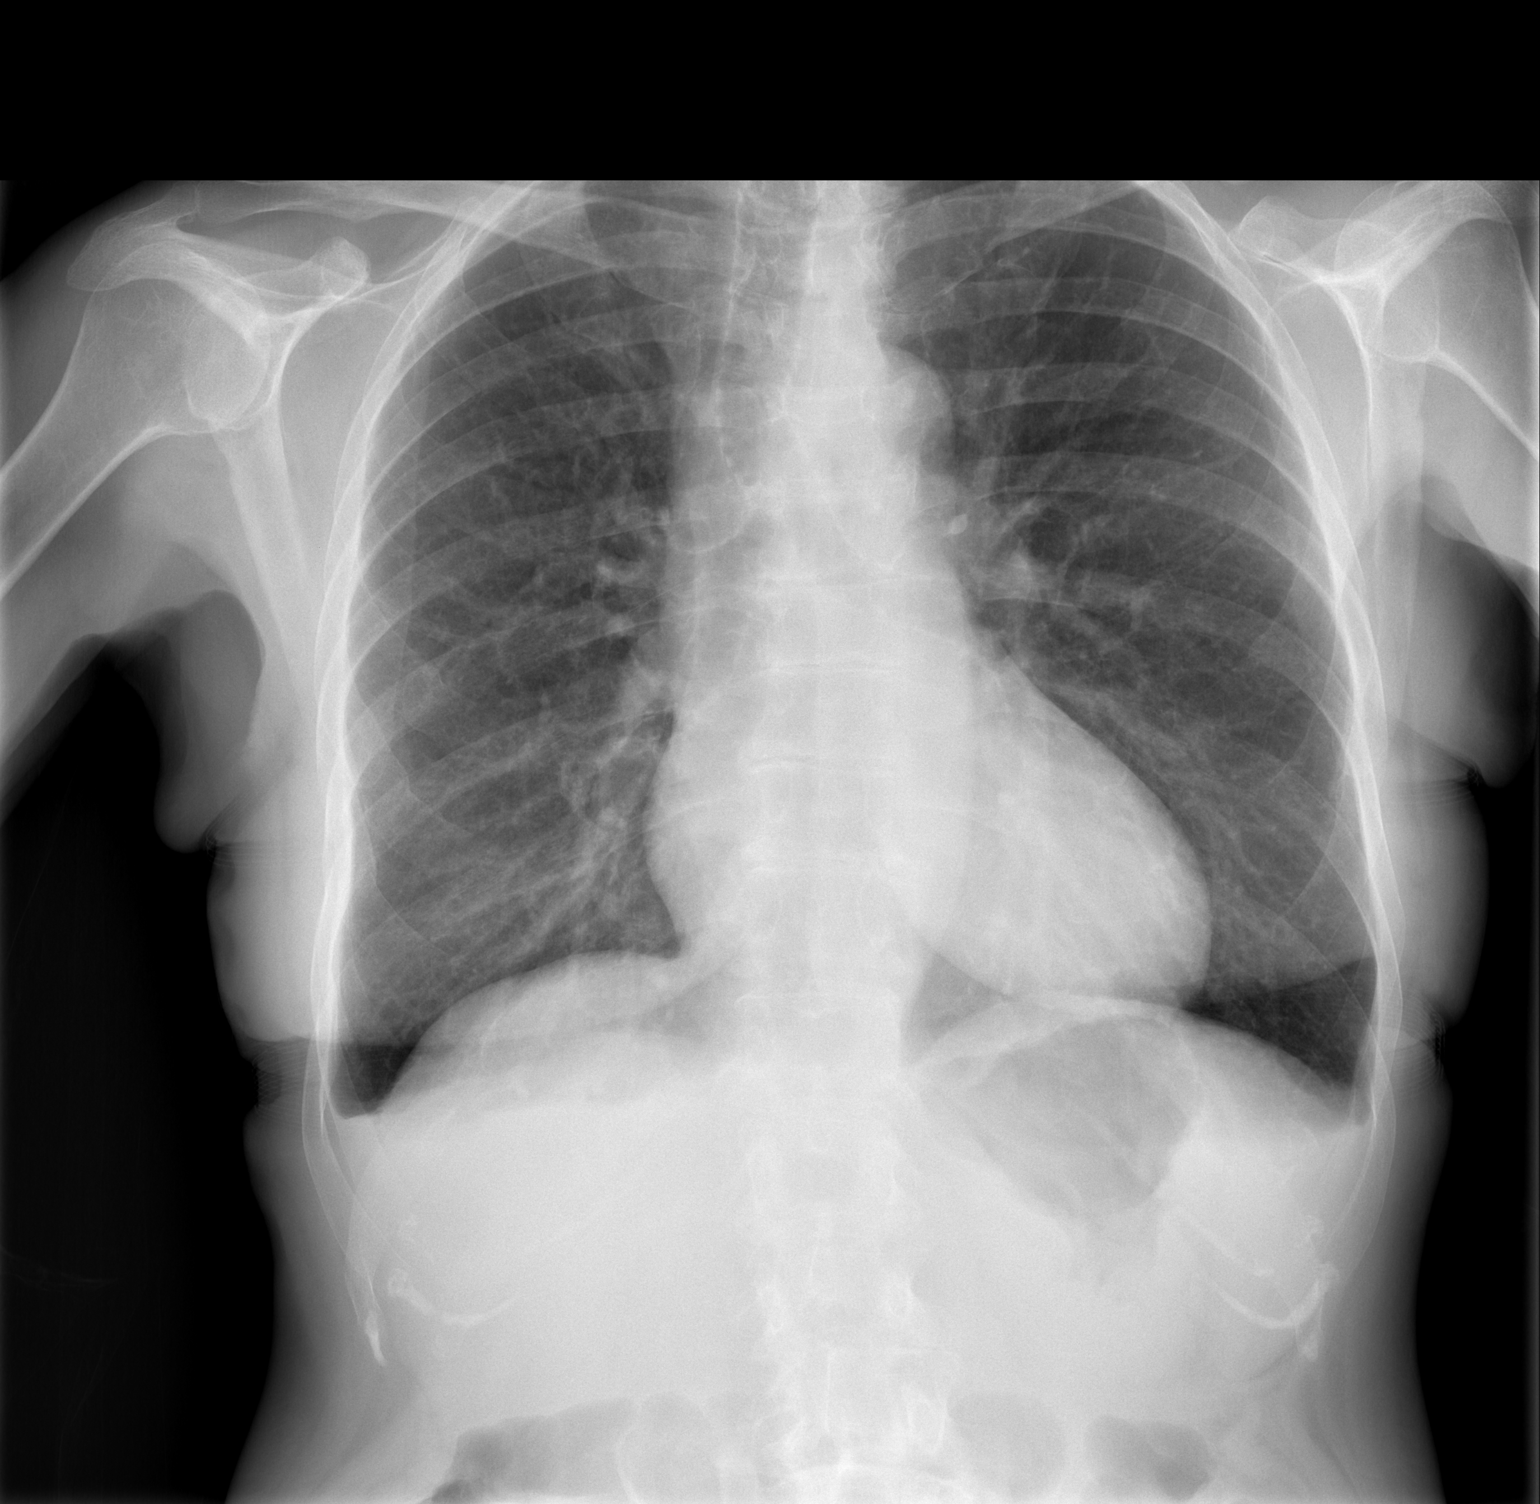

[w chest lat]
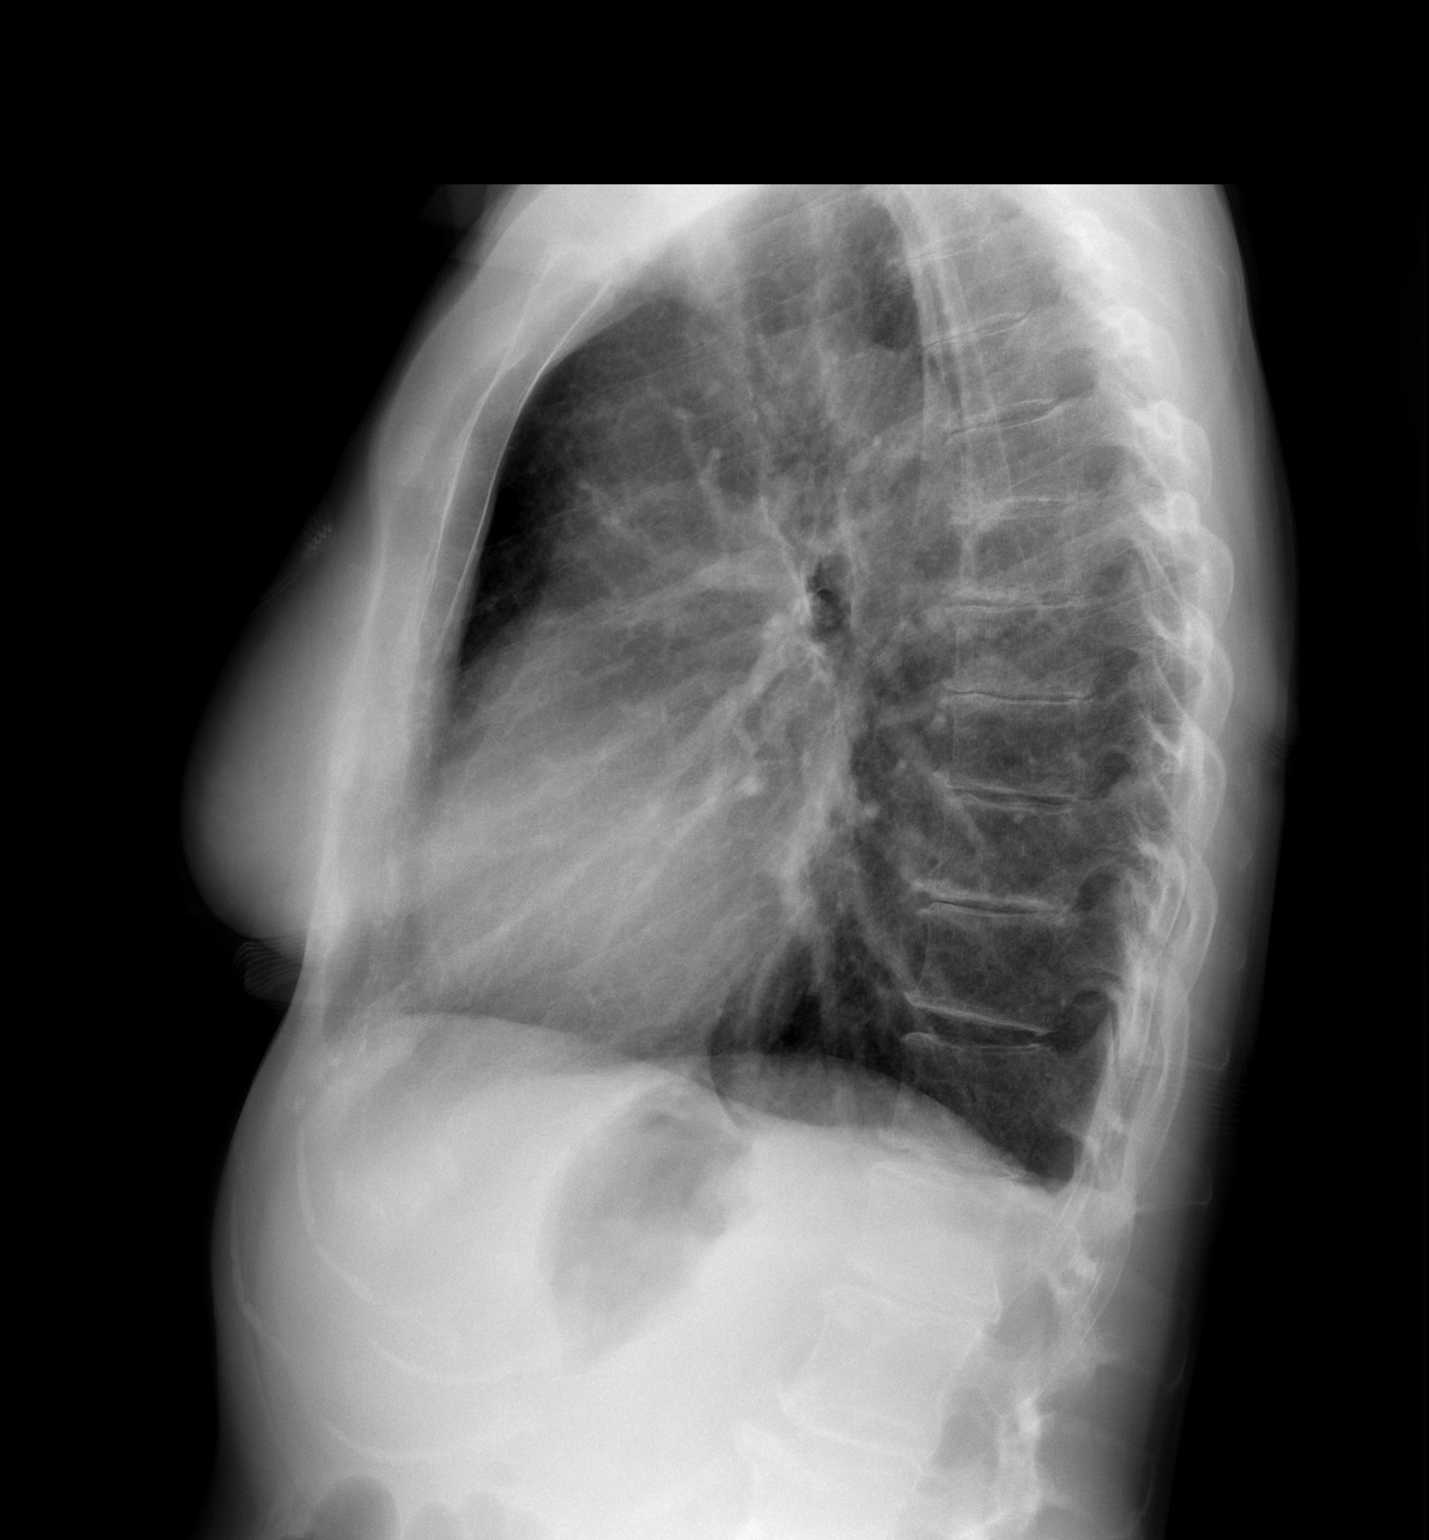

[2 of 2 positions shown; findings below may reference images not displayed]

FINDINGS: There is blunting of the costophrenic angles bilaterally. There is
no pneumothorax. No large focal infiltrate. The heart size is
normal. There is no acute osseous abnormality. There are
degenerative changes throughout the visualized thoracolumbar spine.
Aortic calcifications are noted.
IMPRESSION: Blunting of the costophrenic angles bilaterally, which may represent
small effusions. No focal infiltrate.

## 2020-02-21 ENCOUNTER — Emergency Department (HOSPITAL_COMMUNITY): Payer: Medicare PPO

## 2020-02-21 ENCOUNTER — Inpatient Hospital Stay (HOSPITAL_COMMUNITY)
Admission: EM | Admit: 2020-02-21 | Discharge: 2020-02-25 | DRG: 871 | Disposition: A | Discharge status: Home Health | Payer: Medicare PPO | Attending: Internal Medicine | Admitting: Internal Medicine

## 2020-02-21 ENCOUNTER — Other Ambulatory Visit: Payer: Self-pay

## 2020-02-21 ENCOUNTER — Encounter (HOSPITAL_COMMUNITY): Payer: Self-pay

## 2020-02-21 DIAGNOSIS — M4622 Osteomyelitis of vertebra, cervical region: Secondary | ICD-10-CM | POA: Diagnosis present

## 2020-02-21 DIAGNOSIS — R000 Tachycardia, unspecified: Secondary | ICD-10-CM | POA: Diagnosis present

## 2020-02-21 DIAGNOSIS — G061 Intraspinal abscess and granuloma: Secondary | ICD-10-CM | POA: Diagnosis present

## 2020-02-21 DIAGNOSIS — R509 Fever, unspecified: Secondary | ICD-10-CM | POA: Diagnosis not present

## 2020-02-21 DIAGNOSIS — Z20822 Contact with and (suspected) exposure to covid-19: Secondary | ICD-10-CM | POA: Diagnosis present

## 2020-02-21 DIAGNOSIS — R7881 Bacteremia: Secondary | ICD-10-CM | POA: Diagnosis present

## 2020-02-21 DIAGNOSIS — D509 Iron deficiency anemia, unspecified: Secondary | ICD-10-CM | POA: Diagnosis present

## 2020-02-21 DIAGNOSIS — Z79899 Other long term (current) drug therapy: Secondary | ICD-10-CM | POA: Diagnosis not present

## 2020-02-21 DIAGNOSIS — E876 Hypokalemia: Secondary | ICD-10-CM | POA: Diagnosis present

## 2020-02-21 DIAGNOSIS — F419 Anxiety disorder, unspecified: Secondary | ICD-10-CM | POA: Diagnosis present

## 2020-02-21 DIAGNOSIS — A4101 Sepsis due to Methicillin susceptible Staphylococcus aureus: Secondary | ICD-10-CM | POA: Diagnosis present

## 2020-02-21 DIAGNOSIS — F329 Major depressive disorder, single episode, unspecified: Secondary | ICD-10-CM | POA: Diagnosis present

## 2020-02-21 DIAGNOSIS — F320 Major depressive disorder, single episode, mild: Secondary | ICD-10-CM | POA: Diagnosis not present

## 2020-02-21 DIAGNOSIS — D649 Anemia, unspecified: Secondary | ICD-10-CM | POA: Diagnosis not present

## 2020-02-21 DIAGNOSIS — F32A Depression, unspecified: Secondary | ICD-10-CM | POA: Diagnosis present

## 2020-02-21 DIAGNOSIS — K222 Esophageal obstruction: Secondary | ICD-10-CM | POA: Diagnosis present

## 2020-02-21 DIAGNOSIS — Z803 Family history of malignant neoplasm of breast: Secondary | ICD-10-CM | POA: Diagnosis not present

## 2020-02-21 DIAGNOSIS — Z95828 Presence of other vascular implants and grafts: Secondary | ICD-10-CM | POA: Diagnosis not present

## 2020-02-21 DIAGNOSIS — L03818 Cellulitis of other sites: Secondary | ICD-10-CM | POA: Diagnosis not present

## 2020-02-21 DIAGNOSIS — B9561 Methicillin susceptible Staphylococcus aureus infection as the cause of diseases classified elsewhere: Secondary | ICD-10-CM | POA: Diagnosis not present

## 2020-02-21 DIAGNOSIS — G062 Extradural and subdural abscess, unspecified: Secondary | ICD-10-CM | POA: Diagnosis present

## 2020-02-21 DIAGNOSIS — K200 Eosinophilic esophagitis: Secondary | ICD-10-CM | POA: Diagnosis present

## 2020-02-21 DIAGNOSIS — R197 Diarrhea, unspecified: Secondary | ICD-10-CM | POA: Diagnosis not present

## 2020-02-21 DIAGNOSIS — L0211 Cutaneous abscess of neck: Secondary | ICD-10-CM | POA: Insufficient documentation

## 2020-02-21 DIAGNOSIS — M1990 Unspecified osteoarthritis, unspecified site: Secondary | ICD-10-CM | POA: Diagnosis present

## 2020-02-21 DIAGNOSIS — L03221 Cellulitis of neck: Secondary | ICD-10-CM | POA: Insufficient documentation

## 2020-02-21 DIAGNOSIS — Z91041 Radiographic dye allergy status: Secondary | ICD-10-CM

## 2020-02-21 DIAGNOSIS — A419 Sepsis, unspecified organism: Secondary | ICD-10-CM | POA: Diagnosis present

## 2020-02-21 LAB — CBC WITH DIFFERENTIAL/PLATELET
Abs Immature Granulocytes: 0.23 10*3/uL — ABNORMAL HIGH (ref 0.00–0.07)
Basophils Absolute: 0 10*3/uL (ref 0.0–0.1)
Basophils Relative: 0 %
Eosinophils Absolute: 0 10*3/uL (ref 0.0–0.5)
Eosinophils Relative: 0 %
HCT: 29.6 % — ABNORMAL LOW (ref 36.0–46.0)
Hemoglobin: 9.7 g/dL — ABNORMAL LOW (ref 12.0–15.0)
Immature Granulocytes: 1 %
Lymphocytes Relative: 4 %
Lymphs Abs: 0.8 10*3/uL (ref 0.7–4.0)
MCH: 29.7 pg (ref 26.0–34.0)
MCHC: 32.8 g/dL (ref 30.0–36.0)
MCV: 90.5 fL (ref 80.0–100.0)
Monocytes Absolute: 1.9 10*3/uL — ABNORMAL HIGH (ref 0.1–1.0)
Monocytes Relative: 9 %
Neutro Abs: 17.8 10*3/uL — ABNORMAL HIGH (ref 1.7–7.7)
Neutrophils Relative %: 86 %
Platelets: 263 10*3/uL (ref 150–400)
RBC: 3.27 MIL/uL — ABNORMAL LOW (ref 3.87–5.11)
RDW: 14.4 % (ref 11.5–15.5)
WBC: 20.8 10*3/uL — ABNORMAL HIGH (ref 4.0–10.5)
nRBC: 0 % (ref 0.0–0.2)

## 2020-02-21 LAB — COMPREHENSIVE METABOLIC PANEL
ALT: 34 U/L (ref 0–44)
AST: 22 U/L (ref 15–41)
Albumin: 2.3 g/dL — ABNORMAL LOW (ref 3.5–5.0)
Alkaline Phosphatase: 103 U/L (ref 38–126)
Anion gap: 12 (ref 5–15)
BUN: 15 mg/dL (ref 8–23)
CO2: 22 mmol/L (ref 22–32)
Calcium: 8.1 mg/dL — ABNORMAL LOW (ref 8.9–10.3)
Chloride: 101 mmol/L (ref 98–111)
Creatinine, Ser: 0.94 mg/dL (ref 0.44–1.00)
GFR calc Af Amer: >60 mL/min (ref >60)
GFR calc non Af Amer: >60 mL/min (ref >60)
Glucose, Bld: 117 mg/dL — ABNORMAL HIGH (ref 70–99)
Potassium: 3.7 mmol/L (ref 3.5–5.1)
Sodium: 135 mmol/L (ref 135–145)
Total Bilirubin: 0.7 mg/dL (ref 0.3–1.2)
Total Protein: 6.1 g/dL — ABNORMAL LOW (ref 6.5–8.1)

## 2020-02-21 LAB — FERRITIN: Ferritin: 372 ng/mL — ABNORMAL HIGH (ref 11–307)

## 2020-02-21 LAB — URINALYSIS, ROUTINE W REFLEX MICROSCOPIC
Bilirubin Urine: NEGATIVE
Glucose, UA: NEGATIVE mg/dL
Hgb urine dipstick: NEGATIVE
Ketones, ur: NEGATIVE mg/dL
Leukocytes,Ua: NEGATIVE
Nitrite: NEGATIVE
Protein, ur: NEGATIVE mg/dL
Specific Gravity, Urine: 1.01 (ref 1.005–1.030)
pH: 7 (ref 5.0–8.0)

## 2020-02-21 LAB — HIV ANTIBODY (ROUTINE TESTING W REFLEX): HIV Screen 4th Generation wRfx: NONREACTIVE

## 2020-02-21 LAB — IRON AND TIBC
Iron: 8 ug/dL — ABNORMAL LOW (ref 28–170)
Saturation Ratios: 3 % — ABNORMAL LOW (ref 10.4–31.8)
TIBC: 239 ug/dL — ABNORMAL LOW (ref 250–450)
UIBC: 231 ug/dL

## 2020-02-21 LAB — POC SARS CORONAVIRUS 2 AG -  ED: SARS Coronavirus 2 Ag: NEGATIVE

## 2020-02-21 LAB — RESPIRATORY PANEL BY RT PCR (FLU A&B, COVID)
Influenza A by PCR: NEGATIVE
Influenza B by PCR: NEGATIVE
SARS Coronavirus 2 by RT PCR: NEGATIVE

## 2020-02-21 LAB — LACTIC ACID, PLASMA: Lactic Acid, Venous: 0.8 mmol/L (ref 0.5–1.9)

## 2020-02-21 IMAGING — MR MR CERVICAL SPINE WO/W CM
4 of 9 series · 16 of 48 positions shown · IV contrast (4.5 M GAD)
Comparison: Neck CT [DATE]

CLINICAL DATA: Neck pain and fever, rule out infection.

EXAM:
MRI CERVICAL SPINE WITHOUT AND WITH CONTRAST
TECHNIQUE: Multiplanar and multiecho pulse sequences of the cervical spine, to
include the craniocervical junction and cervicothoracic junction,
were obtained without and with intravenous contrast.
CONTRAST:  4.5mL GADAVIST GADOBUTROL 1 MMOL/ML IV SOLN

[Series 2: T2 · sagittal · 3.0mm · 0.31mm/px · 2 of 16 slices shown (1 of 2)]
[im 1/16]
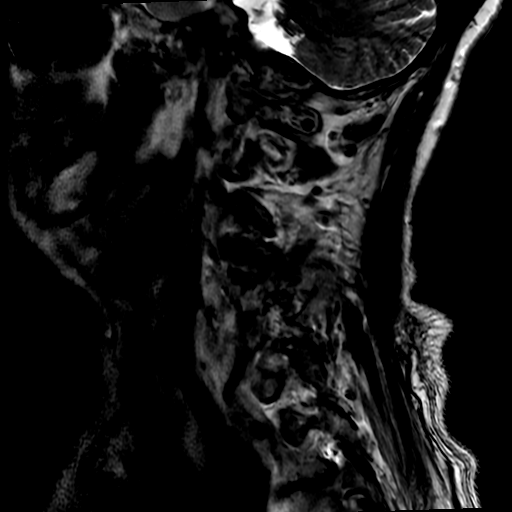
[im 16/16]
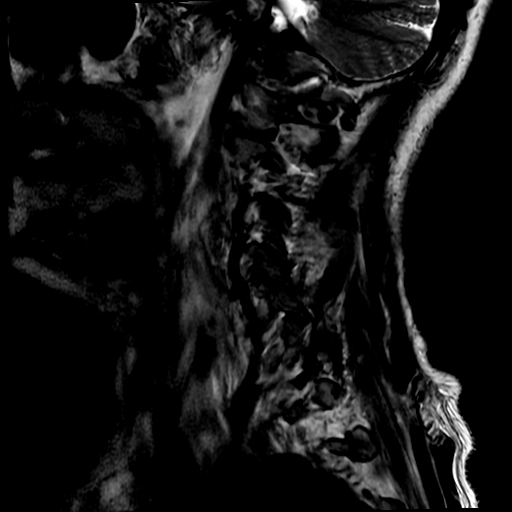

[Series 7: T2 · oblique · 3.0mm · 0.35mm/px · 6 of 36 slices shown (2 of 2)]
[im 1/36]
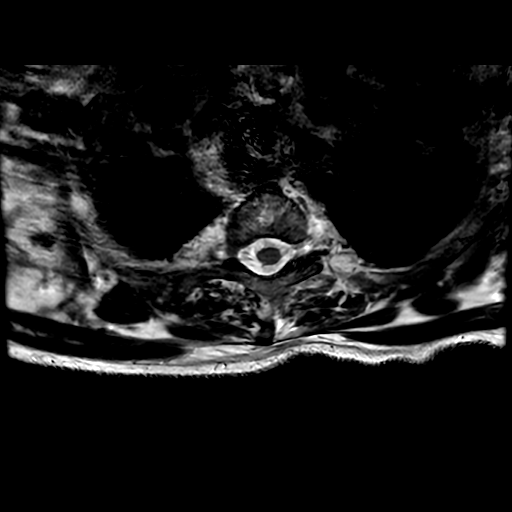
[im 8/36]
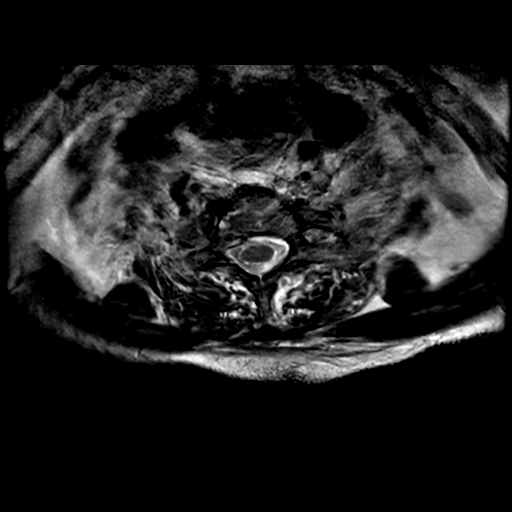
[im 15/36]
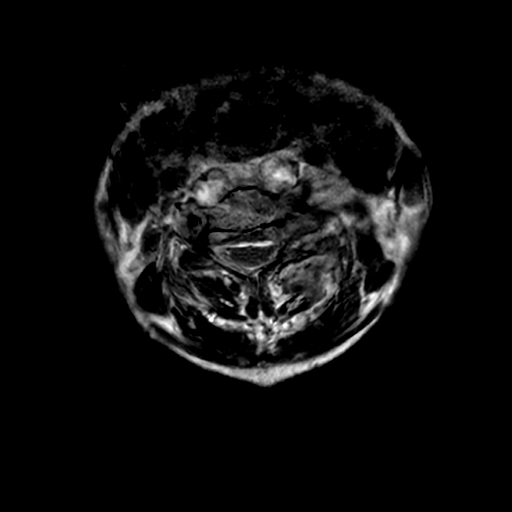
[im 22/36]
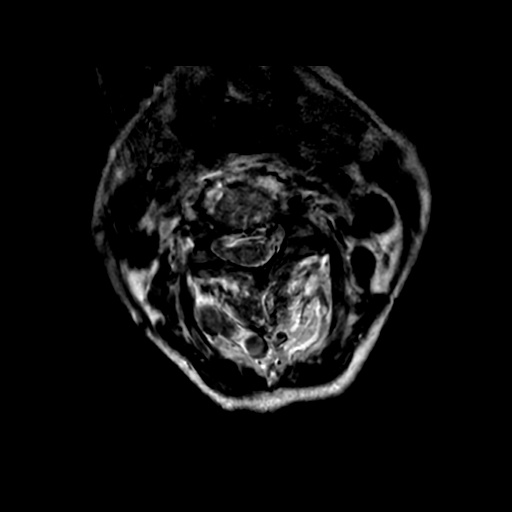
[im 29/36]
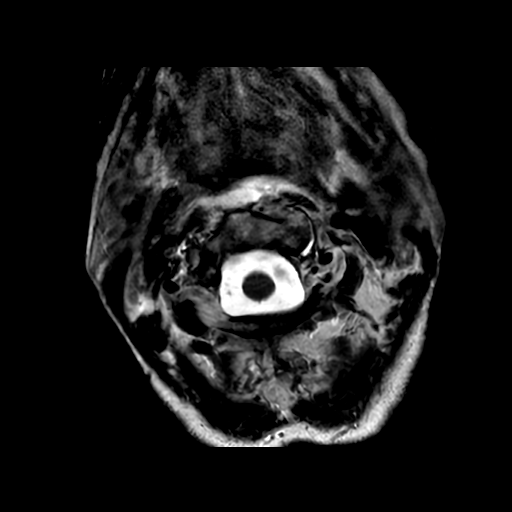
[im 36/36]
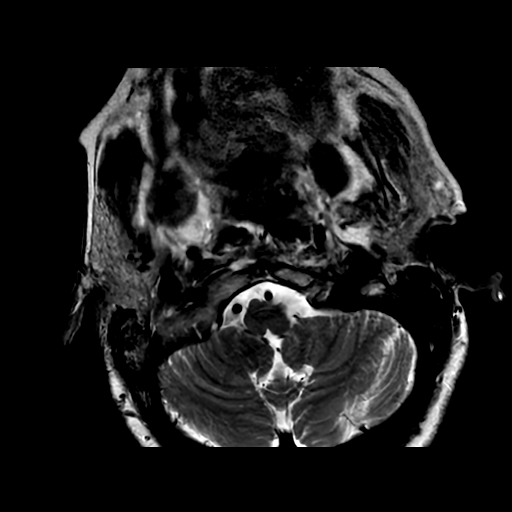

[Series 8: T1 · oblique · non-contrast · 3.0mm · 0.35mm/px · 5 of 36 slices shown]
[im 1/36]
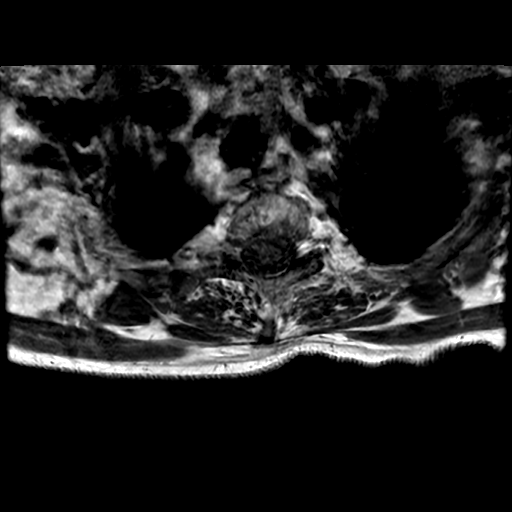
[im 8/36]
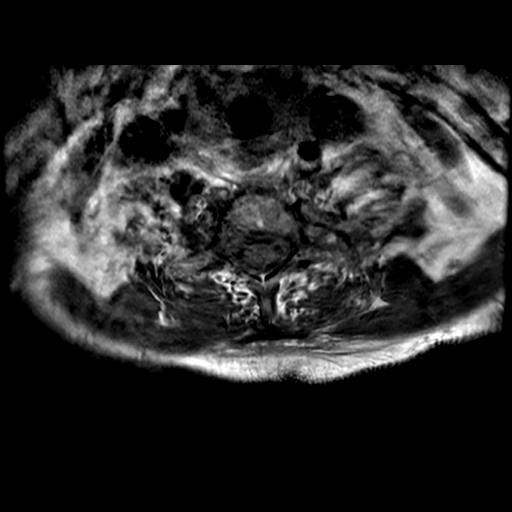
[im 15/36]
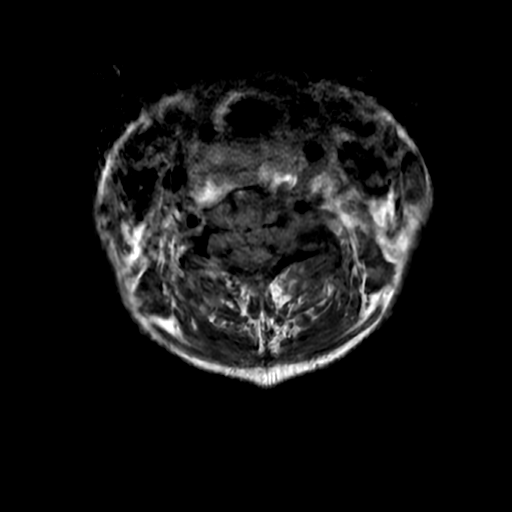
[im 22/36]
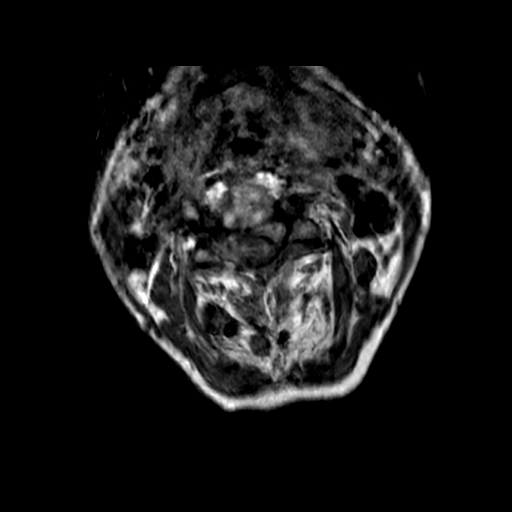
[im 36/36]
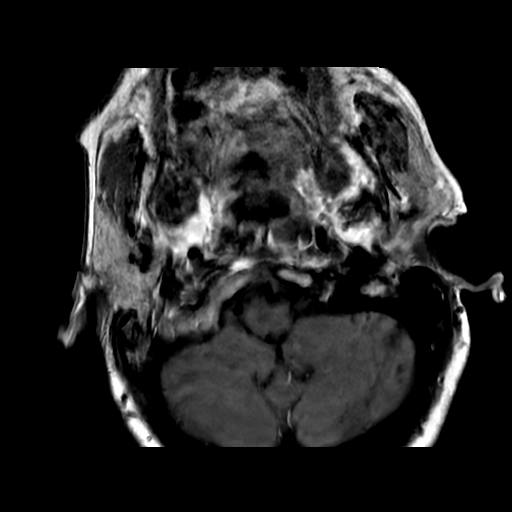

[Series 9: T1 fat-sat post-contrast · sagittal · 3.0mm · 0.31mm/px · 3 of 16 slices shown]
[im 1/16]
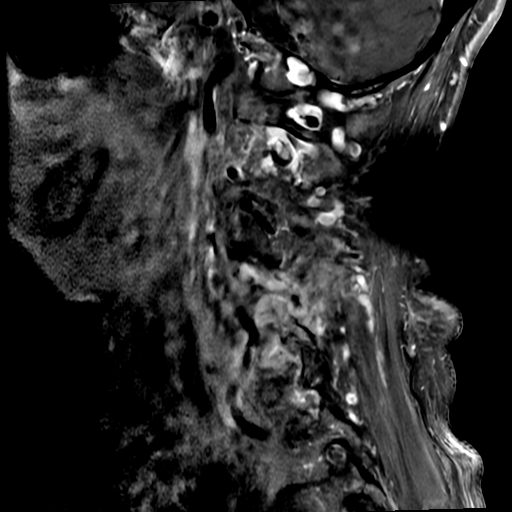
[im 8/16]
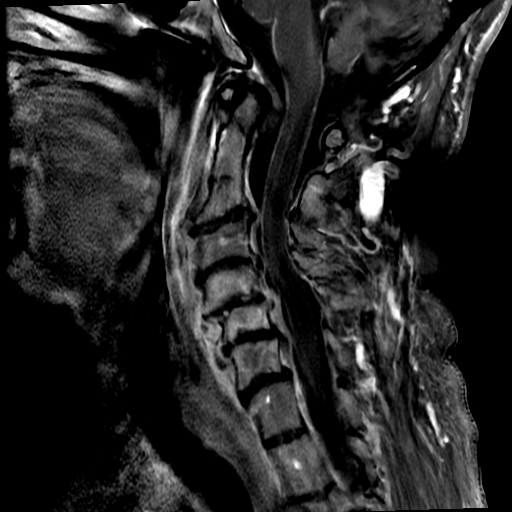
[im 16/16]
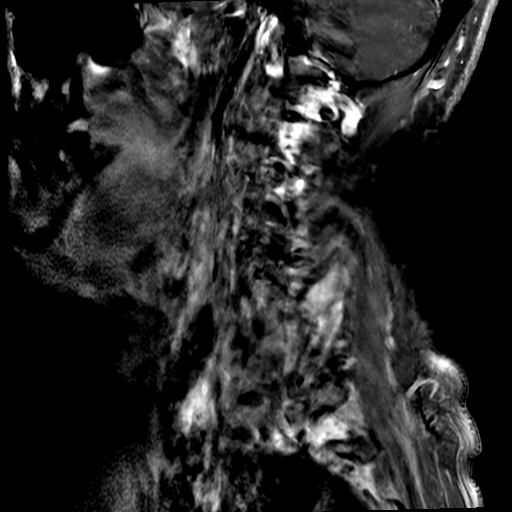

[16 of 48 positions shown; findings below may reference images not displayed]

FINDINGS: Multiple sequences are moderate to severely motion degraded,
limiting evaluation.

Alignment: C3-C4 grade 1 retrolisthesis. C3-C4 grade 1
anterolisthesis. C7-T1 grade 1 anterolisthesis

Vertebrae: There is congenital fusion of the C2 and C3 vertebrae.
There is marked multilevel degenerative endplate irregularity. There
is T1 low signal as well as enhancement within the C4, C5, C6 and C7
vertebrae. Additionally, there is prominent marrow edema and
enhancement along the C4-C5 and C5-C6 facet joints on the left.

Cord: No definite spinal cord signal abnormality or abnormal cord
enhancement is identified within the limitations of significant
motion degradation. There is extensive ventral epidural phlegmon
spanning the C2-T1 levels. This measures up to 5 mm in greatest AP
dimension at the C6-C7 level where there also appears to be formed
ventral epidural abscess (series 9, image 10) (series 10, image 25).
Abnormal enhancement extends into the neural foramina bilaterally at
multiple levels.

Posterior Fossa, vertebral arteries, paraspinal tissues: There is
extensive ventrolateral paravertebral soft tissue swelling, edema
and enhancement consistent extending from the level of the skull
base to the T1 level. The prevertebral soft tissues are thickened to
9 mm. Findings are most consistent with cellulitis/phlegmon at this
site. There are small foci of hypoenhancement ventral to the C6 and
C7 vertebrae which may reflect relatively spared musculature. Early
small abscesses are not excluded

Disc levels:

Severe multilevel disc degeneration.

C2-C3: Congenital fusion. Ventral epidural phlegmon. No significant
spinal canal or neural foraminal narrowing.

C3-C4: Posterior disc osteophyte complex. Uncinate, facet and
ligamentum flavum hypertrophy. Ventral epidural phlegmon. Moderate
to moderately advanced spinal canal stenosis. Mild left neural
foraminal narrowing.

C4-C5: Posterior disc osteophyte complex. Uncinate, facet and
ligamentum flavum hypertrophy. Ventral epidural phlegmon. Moderate
to moderately advanced spinal canal stenosis. Moderate left neural
foraminal narrowing

C5-C6: Posterior disc osteophyte complex. Uncinate/facet
hypertrophy. Ventral epidural phlegmon. Moderate spinal canal
stenosis. Mild bilateral neural foraminal narrowing.

C6-C7: Posterior disc osteophyte complex. Ventral epidural abscess.
Bilateral disc osteophyte ridge/uncinate hypertrophy. Facet
hypertrophy. Ventral epidural abscess. Moderate spinal canal
stenosis. Moderate bilateral neural foraminal narrowing (greater on
the right).

C7-T1: Right-sided disc osteophyte ridge. Facet hypertrophy. No
significant spinal canal stenosis. Moderate right with mild left
neural foraminal narrowing.

These results were called by telephone at the time of interpretation
on [DATE] at [DATE] to provider Dr. BILLIOT, who verbally
acknowledged these results.
IMPRESSION: Multiple sequences are moderate to severely motion degraded,
limiting evaluation.

Please note there is congenital fusion of the C2 and C3 vertebrae.

Findings consistent with extensive soft tissue infection within the
neck. This includes ventrolateral prevertebral cellulitis/phlegmon
extending from the skull base to the T1 level. There are small foci
of hypoenhancement anterior to the C6 and C7 vertebrae which may
reflect relatively spared musculature. Small abscesses are not
excluded.

Ventral epidural phlegmon spanning the C2-T1 levels. This measures
up to 8 mm in greatest AP dimension at the C6-C7 level where there
also appears to be formed ventral epidural abscess. Abnormal
enhancement extends into the bilateral neural foramina at multiple
levels.

Edema and enhancement within the C4, C5, C6 and C7 vertebrae.
Prominent marrow edema and enhancement are also present along the
C4-C5 and C5-C6 facet joints on the left. Findings are highly
suspicious for osteomyelitis given the constellation of findings,
although there is moderate to advanced cervical spondylosis.

Multilevel spinal canal stenosis greatest at C3-C4 and C4-C5
(moderate to moderately advanced at these levels). Sites of mild and
moderate neural foraminal narrowing as described.

## 2020-02-21 IMAGING — DX DG CHEST 1V PORT
1 series · 1 of 1 positions shown · non-contrast
Comparison: [DATE]

CLINICAL DATA: Fever and cough over the last 10 days.

EXAM:
PORTABLE CHEST 1 VIEW

[chest]
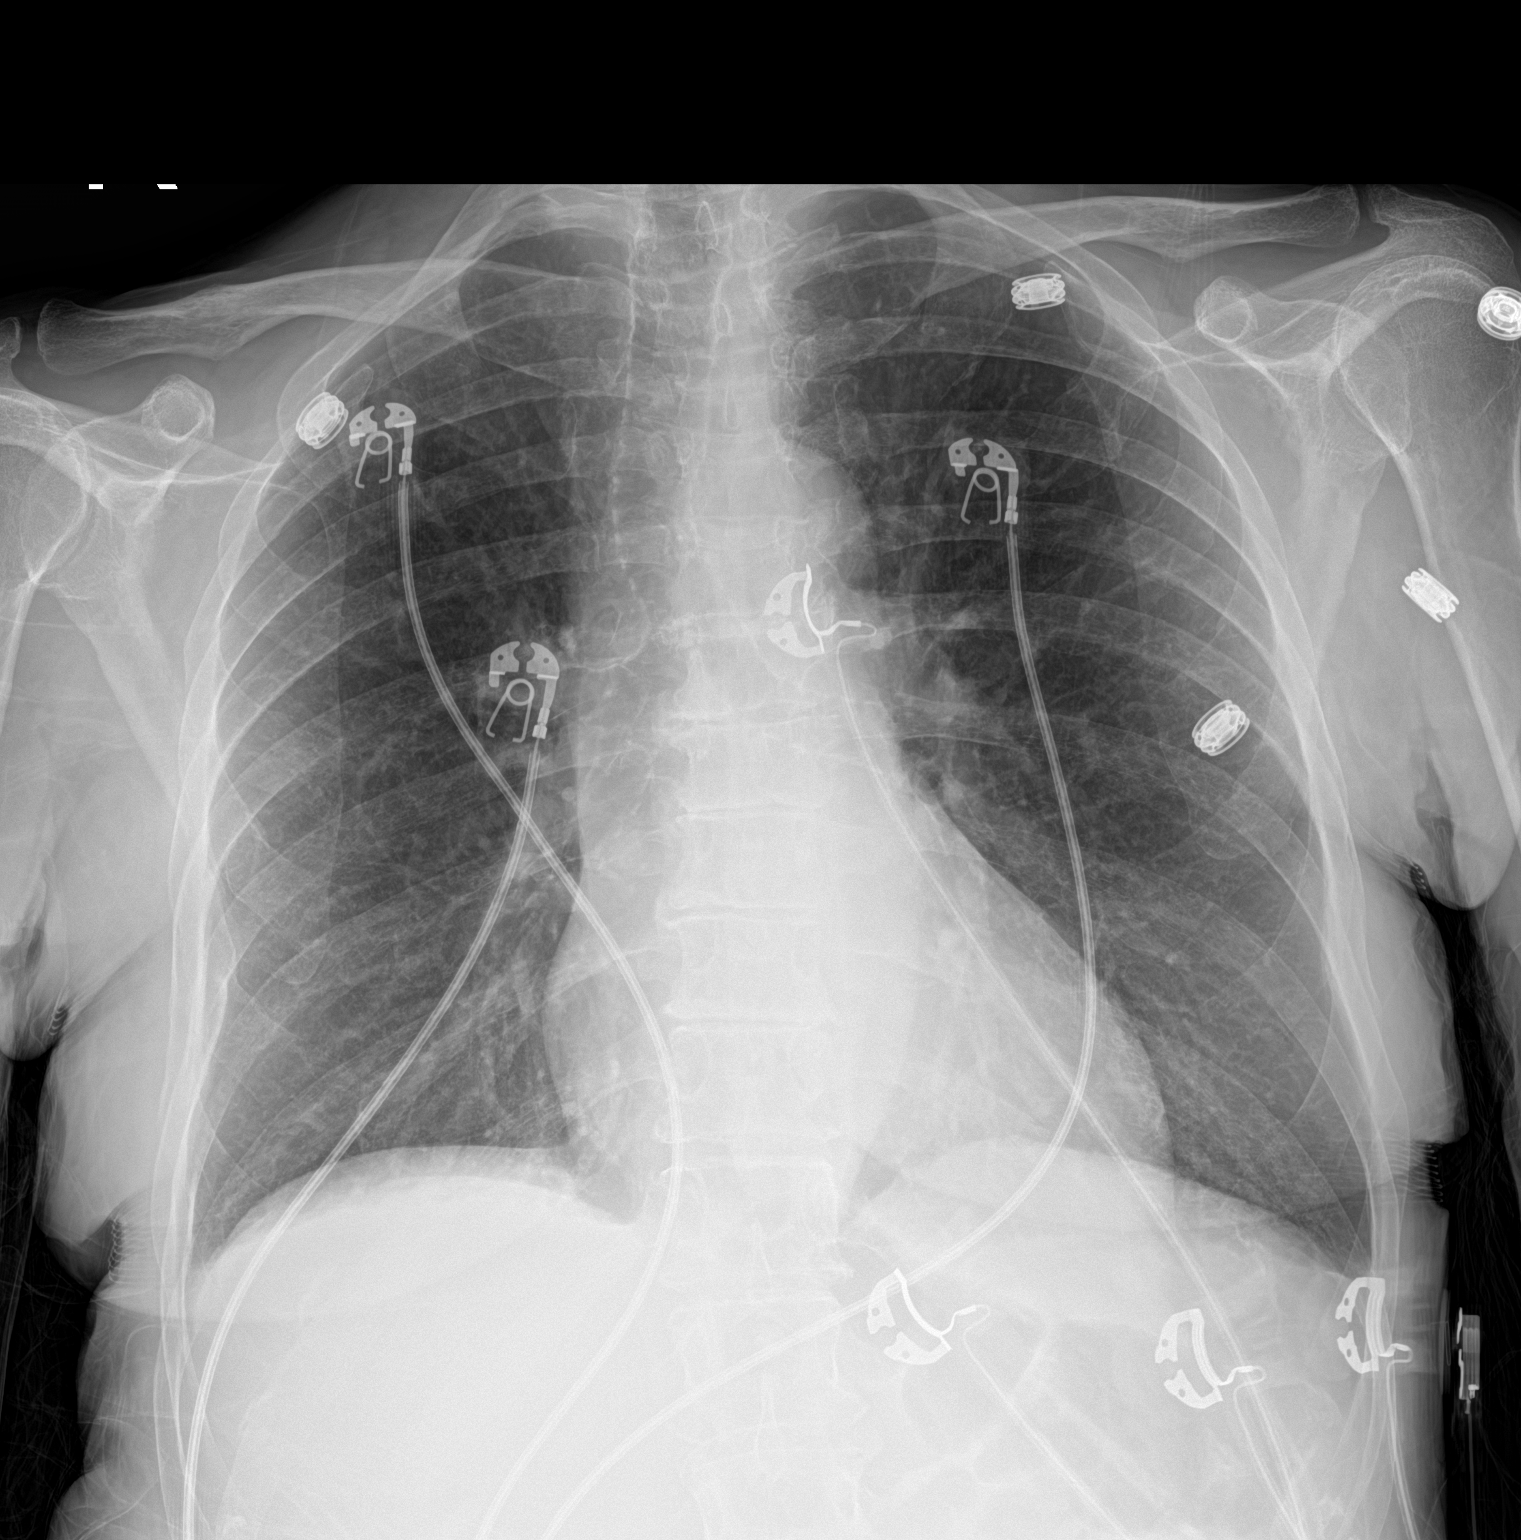

[1 of 1 positions shown; findings below may reference images not displayed]

FINDINGS: The heart size and mediastinal contours are within normal limits.
Both lungs are clear. The visualized skeletal structures are
unremarkable.
IMPRESSION: No active disease.

## 2020-02-21 MED ORDER — ACETAMINOPHEN 650 MG RE SUPP: 650.0000 mg | Freq: Four times a day (QID) | RECTAL | Status: DC | PRN

## 2020-02-21 MED ORDER — TRAZODONE HCL 100 MG PO TABS
100.0000 mg | ORAL_TABLET | Freq: Every day | ORAL | Status: DC
  Administered 2020-02-21 – 2020-02-24 (×4): 100 mg via ORAL
  Filled 2020-02-21 (×4): qty 1

## 2020-02-21 MED ORDER — ENOXAPARIN SODIUM 40 MG/0.4ML ~~LOC~~ SOLN
40.0000 mg | SUBCUTANEOUS | Status: DC
  Administered 2020-02-21 – 2020-02-24 (×4): 40 mg via SUBCUTANEOUS
  Filled 2020-02-21 (×5): qty 0.4

## 2020-02-21 MED ORDER — VITAMIN B-12 1000 MCG PO TABS
1250.0000 ug | ORAL_TABLET | Freq: Every day | ORAL | Status: DC
  Administered 2020-02-21 – 2020-02-22 (×2): 1250 ug via ORAL
  Filled 2020-02-21 (×5): qty 1

## 2020-02-21 MED ORDER — SODIUM CHLORIDE 0.9 % IV BOLUS
1000.0000 mL | Freq: Once | INTRAVENOUS | Status: AC
  Administered 2020-02-21: 1000 mL via INTRAVENOUS

## 2020-02-21 MED ORDER — GABAPENTIN 100 MG PO CAPS
100.0000 mg | ORAL_CAPSULE | Freq: Three times a day (TID) | ORAL | Status: DC
  Administered 2020-02-21 – 2020-02-25 (×12): 100 mg via ORAL
  Filled 2020-02-21 (×12): qty 1

## 2020-02-21 MED ORDER — SODIUM CHLORIDE 0.9 % IV SOLN: INTRAVENOUS | Status: DC

## 2020-02-21 MED ORDER — ALPRAZOLAM 0.25 MG PO TABS
0.2500 mg | ORAL_TABLET | Freq: Two times a day (BID) | ORAL | Status: DC | PRN
  Administered 2020-02-25 (×2): 0.25 mg via ORAL
  Filled 2020-02-21 (×2): qty 1

## 2020-02-21 MED ORDER — TRAZODONE HCL 50 MG PO TABS
50.0000 mg | ORAL_TABLET | Freq: Every day | ORAL | Status: DC
  Administered 2020-02-22 – 2020-02-25 (×4): 50 mg via ORAL
  Filled 2020-02-21 (×4): qty 1

## 2020-02-21 MED ORDER — PIPERACILLIN-TAZOBACTAM 3.375 G IVPB: 3.3750 g | Freq: Three times a day (TID) | INTRAVENOUS | Status: DC

## 2020-02-21 MED ORDER — ACETAMINOPHEN 325 MG PO TABS
650.0000 mg | ORAL_TABLET | Freq: Once | ORAL | Status: AC
  Administered 2020-02-21: 650 mg via ORAL
  Filled 2020-02-21: qty 2

## 2020-02-21 MED ORDER — CLINDAMYCIN PHOSPHATE 900 MG/50ML IV SOLN
900.0000 mg | Freq: Once | INTRAVENOUS | Status: AC
  Administered 2020-02-21: 900 mg via INTRAVENOUS
  Filled 2020-02-21: qty 50

## 2020-02-21 MED ORDER — GADOBUTROL 1 MMOL/ML IV SOLN
4.5000 mL | Freq: Once | INTRAVENOUS | Status: AC | PRN
  Administered 2020-02-21: 4.5 mL via INTRAVENOUS

## 2020-02-21 MED ORDER — PANTOPRAZOLE SODIUM 40 MG PO TBEC
40.0000 mg | DELAYED_RELEASE_TABLET | Freq: Two times a day (BID) | ORAL | Status: DC
  Administered 2020-02-22 – 2020-02-25 (×7): 40 mg via ORAL
  Filled 2020-02-21 (×8): qty 1

## 2020-02-21 MED ORDER — PIPERACILLIN-TAZOBACTAM 3.375 G IVPB 30 MIN
3.3750 g | Freq: Once | INTRAVENOUS | Status: AC
  Administered 2020-02-22: 3.375 g via INTRAVENOUS
  Filled 2020-02-21: qty 50

## 2020-02-21 MED ORDER — ONDANSETRON HCL 4 MG/2ML IJ SOLN: 4.0000 mg | Freq: Four times a day (QID) | INTRAMUSCULAR | Status: DC | PRN

## 2020-02-21 MED ORDER — HYDROMORPHONE HCL 1 MG/ML IJ SOLN: 0.5000 mg | INTRAMUSCULAR | Status: DC | PRN

## 2020-02-21 MED ORDER — VITAMIN B-12 2500 MCG SL SUBL: 1250.0000 ug | SUBLINGUAL_TABLET | SUBLINGUAL | Status: DC

## 2020-02-21 MED ORDER — ACETAMINOPHEN 325 MG PO TABS
650.0000 mg | ORAL_TABLET | Freq: Four times a day (QID) | ORAL | Status: DC | PRN
  Administered 2020-02-21 – 2020-02-25 (×7): 650 mg via ORAL
  Filled 2020-02-21 (×7): qty 2

## 2020-02-21 MED ORDER — ONDANSETRON HCL 4 MG PO TABS: 4.0000 mg | ORAL_TABLET | Freq: Four times a day (QID) | ORAL | Status: DC | PRN

## 2020-02-21 MED ORDER — FERROUS SULFATE 325 (65 FE) MG PO TABS
325.0000 mg | ORAL_TABLET | ORAL | Status: DC
  Administered 2020-02-21 – 2020-02-24 (×2): 325 mg via ORAL
  Filled 2020-02-21 (×2): qty 1

## 2020-02-21 MED ORDER — BUDESONIDE 0.5 MG/2ML IN SUSP: 1.0000 mg | RESPIRATORY_TRACT | Status: DC

## 2020-02-21 MED ORDER — METHOCARBAMOL 500 MG PO TABS
500.0000 mg | ORAL_TABLET | Freq: Four times a day (QID) | ORAL | Status: DC | PRN
  Administered 2020-02-24: 500 mg via ORAL
  Filled 2020-02-21: qty 1

## 2020-02-21 MED ORDER — HIGH POTENCY IRON 65 MG PO TABS: 65.0000 mg | ORAL_TABLET | ORAL | Status: DC

## 2020-02-21 MED ORDER — LORAZEPAM 2 MG/ML IJ SOLN
1.0000 mg | Freq: Once | INTRAMUSCULAR | Status: AC | PRN
  Administered 2020-02-21: 1 mg via INTRAVENOUS
  Filled 2020-02-21: qty 1

## 2020-02-21 NOTE — Progress Notes (Signed)
Pharmacy Antibiotic Note  Megan Cox is a 69 y.o. female admitted on 02/21/2020 with sepsis d/t prevertebral abscess/cellulitis/osteomyelitis.  Pharmacy has been consulted for Zosyn dosing per ID recs.  Plan: Zosyn 3.375g IV Q8H (4-hour infusion).  Height: 5\' 2"  (157.5 cm) Weight: 106 lb (48.1 kg) IBW/kg (Calculated) : 50.1  Temp (24hrs), Avg:100.9 F (38.3 C), Min:99.1 F (37.3 C), Max:102.1 F (38.9 C)  Recent Labs  Lab 02/21/20 1058  WBC 20.8*  CREATININE 0.94  LATICACIDVEN 0.8    Estimated Creatinine Clearance: 43.5 mL/min (by C-G formula based on SCr of 0.94 mg/dL).    Allergies  Allergen Reactions  . Contrast Media [Iodinated Diagnostic Agents] Anaphylaxis, Shortness Of Breath and Swelling    PATIENT STATES THAT HER BROTHER HAD ANAPHYLAXIS WITH IV CONTRAST AND WANTS TO SHARE THIS FAMILY HISTORY. Brother died from reaction     Thank you for allowing pharmacy to be a part of this patient's care.  Wynona Neat, PharmD, BCPS  02/21/2020 11:08 PM

## 2020-02-21 NOTE — Progress Notes (Signed)
Pt arrived to 2W at [facility]. She is alert and oriented x4. Pt had generalized tremors and reported chills. RN took temp. It was 99.1. RN reported findings to [facility] MD - tylenol administered. RN reported findings to night shift RN Anderson Malta).

## 2020-02-21 NOTE — ED Notes (Signed)
Patient transported to MRI 

## 2020-02-21 NOTE — Plan of Care (Signed)

## 2020-02-21 NOTE — Consult Note (Signed)
Reason for Consult: Prevertebral cellulitis/phlegmon Referring Physician: Benedetto Goad, PA-C  HPI:  Megan Cox is an 69 y.o. female who presents to the [facility] ER today complaining of fever and generalized body aches. She has been symptomatic since Wednesday 02/19/20. She has a history of eosinophilic esophagitis, Mallory-Weiss tears x2, osteoarthritis, and anxiety. She also c/o bilateral shoulder pain and neck pain that has been going on since Monday, March 8.  She reports intermittent upper arm weakness and upper leg weakness bilaterally.  She was started on prednisone on Saturday 3/13.  Fever started on Wednesday 3/17, with increased shortness of breath, some coughing, no hematemesis.  She endorses chest discomfort/tightness as of yesterday and today, worse with inhalation.  Of note, patient received a Covid vaccine on 02-11-20. Her neck MRI in the ER shows extensive soft tissue infection within the neck. This includes ventrolateral prevertebral cellulitis/phlegmon extending from the skull base to the T1 level.    Past Medical History:  Diagnosis Date  . Depression     Past Surgical History:  Procedure Laterality Date  . ESOPHAGOGASTRODUODENOSCOPY  2012  . ESOPHAGOGASTRODUODENOSCOPY N/A 09/22/2013   Procedure: ESOPHAGOGASTRODUODENOSCOPY (EGD);  Surgeon: Lear Ng, MD;  Location: [facility];  Service: Endoscopy;  Laterality: N/A;  . OVARIAN CYST REMOVAL  1973  . TUBAL LIGATION      Family History  Problem Relation Age of Onset  . Breast cancer Mother 14    Social History:  reports that she has never smoked. She has never used smokeless tobacco. She reports that she does not drink alcohol or use drugs.  Allergies: No Known Allergies  Prior to Admission medications   Medication Sig Start Date End Date Taking? Authorizing Provider  ALPRAZolam Duanne Moron) 0.25 MG tablet Take 0.25 mg by mouth at bedtime as needed for anxiety.    [provider]  diphenhydrAMINE (BENADRYL)  25 MG tablet Take 25 mg by mouth every 6 (six) hours as needed for itching.    [provider]  fexofenadine (ALLEGRA) 180 MG tablet Take 180 mg by mouth daily.    [provider]  glucosamine-chondroitin 500-400 MG tablet Take 1 tablet by mouth 2 (two) times daily. Cuts in half    [provider]  Multiple Vitamin (MULTIVITAMIN WITH MINERALS) TABS tablet Take 1 tablet by mouth daily.    [provider]  Soy Isoflavone 40 MG TABS Take 2 tablets by mouth daily.    [provider]  traZODone (DESYREL) 50 MG tablet Patient takes 50 mg in the am and 100 mg at bedtime 09/25/13   Nani Skillern, [place]      DG Chest 2 View  Result Date: 02/20/2020 CLINICAL DATA:  Fever and chills EXAM: CHEST - 2 VIEW COMPARISON:  February 16, 2006. 09/23/2013 FINDINGS: There is blunting of the costophrenic angles bilaterally. There is no pneumothorax. No large focal infiltrate. The heart size is normal. There is no acute osseous abnormality. There are degenerative changes throughout the visualized thoracolumbar spine. Aortic calcifications are noted. IMPRESSION: Blunting of the costophrenic angles bilaterally, which may represent small effusions. No focal infiltrate. Electronically Signed   By: Constance Holster M.D.   On: 02/20/2020 15:18   MR Cervical Spine W or Wo Contrast  Result Date: 02/21/2020 CLINICAL DATA:  Neck pain and fever, rule out infection. EXAM: MRI CERVICAL SPINE WITHOUT AND WITH CONTRAST TECHNIQUE: Multiplanar and multiecho pulse sequences of the cervical spine, to include the craniocervical junction and cervicothoracic junction, were obtained without and  with intravenous contrast. CONTRAST:  4.24mL GADAVIST GADOBUTROL 1 MMOL/ML IV SOLN COMPARISON:  Neck CT 09/22/2013 FINDINGS: Multiple sequences are moderate to severely motion degraded, limiting evaluation. Alignment: C3-C4 grade 1 retrolisthesis. C3-C4 grade 1 anterolisthesis. 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 There is marked multilevel degenerative endplate irregularity. There is T1 low signal as well as enhancement within the C4, C5, C6 and C7 vertebrae. Additionally, there is prominent marrow edema and enhancement along the C4-C5 and C5-C6 facet joints on the left. Cord: No definite spinal cord signal abnormality or abnormal cord enhancement is identified within the limitations of significant motion degradation. There is extensive ventral epidural phlegmon spanning the C2-T1 levels. This measures up to 5 mm in greatest AP dimension at the C6-C7 level where there also appears to be formed ventral epidural abscess (series 9, image 10) (series 10, image 25). Abnormal enhancement extends into the neural foramina bilaterally at multiple levels. Posterior Fossa, vertebral arteries, paraspinal tissues: There is extensive ventrolateral paravertebral soft tissue swelling, edema and enhancement consistent extending from the level of the skull base to the T1 level. The prevertebral soft tissues are thickened to 9 mm. Findings are most consistent with cellulitis/phlegmon at this site. There are small foci of hypoenhancement ventral to the C6 and C7 vertebrae which may reflect relatively spared musculature. Early small abscesses are not excluded Disc levels: Severe multilevel disc degeneration. C2-C3: Congenital fusion. Ventral epidural phlegmon. No significant spinal canal or neural foraminal narrowing. C3-C4: Posterior disc osteophyte complex. Uncinate, facet and ligamentum flavum hypertrophy. Ventral epidural phlegmon. Moderate to moderately advanced spinal canal stenosis. Mild left neural foraminal narrowing. C4-C5: Posterior disc osteophyte complex. Uncinate, facet and ligamentum flavum hypertrophy. Ventral epidural phlegmon. Moderate to moderately advanced spinal canal stenosis. Moderate left neural foraminal narrowing C5-C6: Posterior disc osteophyte complex.  Uncinate/facet hypertrophy. Ventral epidural phlegmon. Moderate spinal canal stenosis. Mild bilateral neural foraminal narrowing. C6-C7: Posterior disc osteophyte complex. Ventral epidural abscess. Bilateral disc osteophyte ridge/uncinate hypertrophy. Facet hypertrophy. Ventral epidural abscess. Moderate spinal canal stenosis. Moderate bilateral neural foraminal narrowing (greater on the right). C7-T1: Right-sided disc osteophyte ridge. Facet hypertrophy. No significant spinal canal stenosis. Moderate right with mild left neural foraminal narrowing. These results were called by telephone at the time of interpretation on 02/21/2020 at 2:33 pm to provider Dr. Gustavus Messing, who verbally acknowledged these results. IMPRESSION: Multiple sequences are moderate to severely motion degraded, limiting evaluation. 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 Findings consistent with extensive soft tissue infection within the neck. This includes ventrolateral prevertebral cellulitis/phlegmon extending from the skull base to the T1 level. There are small foci of hypoenhancement anterior to the C6 and C7 vertebrae which may reflect relatively spared musculature. Small abscesses are not excluded. Ventral epidural phlegmon spanning the C2-T1 levels. This measures up to 8 mm in greatest AP dimension at the C6-C7 level where there also appears to be formed ventral epidural abscess. Abnormal enhancement extends into the bilateral neural foramina at multiple levels. Edema and enhancement within the C4, C5, C6 and C7 vertebrae. Prominent marrow edema and enhancement are also present along the C4-C5 and C5-C6 facet joints on the left. Findings are highly suspicious for osteomyelitis given the constellation of findings, although there is moderate to advanced cervical spondylosis. Multilevel spinal canal stenosis greatest at C3-C4 and C4-C5 (moderate to moderately advanced at these levels). Sites of mild and moderate neural  foraminal narrowing as described. Electronically Signed  By: Kellie Simmering DO   On: 02/21/2020 14:35   DG Chest Port 1 View  Result Date: 02/21/2020 CLINICAL DATA:  Fever and cough over the last 10 days. EXAM: PORTABLE CHEST 1 VIEW COMPARISON:  02/20/2020 FINDINGS: The heart size and mediastinal contours are within normal limits. Both lungs are clear. The visualized skeletal structures are unremarkable. IMPRESSION: No active disease. Electronically Signed   By: Nelson Chimes M.D.   On: 02/21/2020 10:55   Review of Systems  Constitutional: Positive for fatigue and fever. Negative for appetite change, chills and unexpected weight change.  HENT: Negative for sore throat, tinnitus, trouble swallowing and voice change.   Eyes: Negative for photophobia, pain and visual disturbance.  Respiratory: Positive for cough, chest tightness and shortness of breath. Negative for wheezing.   Cardiovascular: Positive for chest pain. Negative for palpitations and leg swelling.  Gastrointestinal: Negative for abdominal distention, abdominal pain, diarrhea, nausea and vomiting.  Endocrine: Negative for polydipsia, polyphagia and polyuria.  Genitourinary: Positive for pelvic pain. Negative for decreased urine volume, difficulty urinating, dyspareunia, dysuria, enuresis, flank pain, frequency, hematuria and urgency.  Musculoskeletal: Positive for arthralgias, back pain, myalgias, neck pain and neck stiffness. Negative for gait problem and joint swelling.  Skin: Negative for color change, pallor, rash and wound.  Neurological: Negative for dizziness, tremors, seizures, syncope, facial asymmetry, speech difficulty, weakness, light-headedness, numbness and headaches.  Psychiatric/Behavioral: The patient is nervous/anxious.   All other systems reviewed and are negative.  Blood pressure 122/74, pulse 100, temperature (!) 102.1 F (38.9 C), temperature source Oral, resp. rate 15, height 5\' 2"  (1.575 m), weight 48.1 kg, SpO2  95 %. Physical Exam Vitals and nursing note reviewed.  Constitutional:  She is not in acute distress. Her skin is warm to touch. Eyes: Pupils are equal, round, reactive to light. Extraocular motion is intact.  Ears: Examination of the ears shows normal auricles and external auditory canals bilaterally.  Nose: Nasal examination shows normal mucosa, septum, turbinates.  Face: Facial examination shows no asymmetry. Palpation of the face elicit no significant tenderness.  Mouth: Oral cavity examination shows no mucosal lacerations, edema, or erythema. No significant trismus is noted.  Neck: Lateral neck is mildly tender to touch. No palpable mass. No fluctuance. The trachea is midline. The thyroid is not significantly enlarged.  Neuro: Cranial nerves 2-12 are all grossly in tact. Cardiovascular:  Regular rhythm. Tachycardia present.  Pulmonary: Pulmonary effort is normal. No respiratory distress.  Skin: Skin is warm and dry. No bruising, erythema, lesion or rash.  Neurological:  No focal deficit present. She is alert.  Psychiatric:  Mood normal.  Behavior normal.      Assessment/Plan: Prevertebral cellulitis/phlegmon. - Recommend admission and treatment with IV antibiotic. - Daily CBC. - Likely will not need any surgical intervention. - Will follow clinically.  Nejla Reasor W Niya Behler 02/21/2020, 4:01 PM

## 2020-02-21 NOTE — H&P (Addendum)
History and Physical    Megan Cox [id number] DOB: May 23, 1951 DOA: 02/21/2020  PCP: Lavone Orn, MD  Patient coming from: Home I have personally briefly reviewed patient's old medical records in [place]  Chief Complaint: Severe neck pain associated with fever and chills  HPI: Megan Cox is a 69 y.o. female with medical history significant of depression/anxiety, eosinophilic esophagitis, arthritis presents to emergency department due to fever, chills, neck, bilateral shoulder and bilateral hip pain since 2 days.  Patient tells me that her symptoms started since  2 weeks and fever started since couple of days. She was evaluated by orthopedic surgery outpatient and was initially started on gabapentin, methocarbamol which didn't helped her. She was started on prednisone last week however since 2 days she is running high-grade fever of 102.8 associated with chills. This morning she had severe neck pain which radiates to her shoulders, upper arms and upper legs associated with weakness in upper arms and upper legs. She said that she couldn't sleep, can't get up and go in the morning so she decided to come to the emergency department for further evaluation and management. No history of fall, headache, blurry vision, urinary, bowel incontinence.  No chest pain, shortness of breath, palpitation, leg swelling, dysuria, decreased appetite, diarrhea or constipation.  She lives alone and denies smoking, alcohol, is a drug use.  She takes naproxen and Tylenol extra strength for pain at home.  No history of melena.  She had upper GI endoscopy on 07/17/2018 which showed eosinophilic esophagitis and esophageal stenosis.   ED Course: Upon arrival to ED: Patient febrile 102.1, tachypneic, tachycardic, CBC shows leukocytosis of 20.8, lactic acid: WNL, UA, CMP: WNL. Blood culture, COVID-19 pending neurosurgery was consulted who recommended ENT evaluation. Clindamycin 900 mg IV once given in the ED.  [facility] hospitalist consulted for admission for IV antibiotics.  Review of Systems: As per HPI otherwise negative.    Past Medical History:  Diagnosis Date  . Depression     Past Surgical History:  Procedure Laterality Date  . ESOPHAGOGASTRODUODENOSCOPY  2012  . ESOPHAGOGASTRODUODENOSCOPY N/A 09/22/2013   Procedure: ESOPHAGOGASTRODUODENOSCOPY (EGD);  Surgeon: Lear Ng, MD;  Location: [facility];  Service: Endoscopy;  Laterality: N/A;  . OVARIAN CYST REMOVAL  1973  . TUBAL LIGATION       reports that she has never smoked. She has never used smokeless tobacco. She reports that she does not drink alcohol or use drugs.  Allergies  Allergen Reactions  . Contrast Media [Iodinated Diagnostic Agents] Anaphylaxis, Shortness Of Breath and Swelling    PATIENT STATES THAT HER BROTHER HAD ANAPHYLAXIS WITH IV CONTRAST AND WANTS TO SHARE THIS FAMILY HISTORY. Brother died from reaction    Family History  Problem Relation Age of Onset  . Breast cancer Mother 80    Prior to Admission medications   Medication Sig Start Date End Date Taking? Authorizing Provider  acetaminophen (TYLENOL) 650 MG CR tablet Take 1,300 mg by mouth every 8 (eight) hours as needed for pain or fever.    Yes [provider]  gabapentin (NEURONTIN) 100 MG capsule Take 100 mg by mouth 3 (three) times daily.   Yes [provider]  methocarbamol (ROBAXIN) 500 MG tablet Take 500 mg by mouth in the morning, at noon, and at bedtime. 02/20/20  Yes [provider]  Multiple Vitamin (MULTIVITAMIN WITH MINERALS) TABS tablet Take 1 tablet by mouth daily.   Yes [provider]  pantoprazole (PROTONIX) 40 MG  tablet Take 40 mg by mouth 2 (two) times daily before a meal.   Yes [provider]  traZODone (DESYREL) 50 MG tablet Patient takes 50 mg in the am and 100 mg at bedtime Patient taking differently: Take 50-100 mg by mouth See admin instructions. Take 50 mg by mouth in the  morning and 100 mg at bedtime 09/25/13  Yes Lars Pinks M, PA-C  ALPRAZolam Duanne Moron) 0.25 MG tablet Take 0.25 mg by mouth at bedtime as needed for anxiety.    [provider]  diphenhydrAMINE (BENADRYL) 25 MG tablet Take 25 mg by mouth every 6 (six) hours as needed for itching.    [provider]  glucosamine-chondroitin 500-400 MG tablet Take 1 tablet by mouth 2 (two) times daily. Cuts in half    [provider]  Soy Isoflavone 40 MG TABS Take 2 tablets by mouth daily.    [provider]    Physical Exam: Vitals:   02/21/20 1700 02/21/20 1715 02/21/20 1730 02/21/20 1745  BP:      Pulse: (!) 106 (!) 106 99 97  Resp: 20 20 17 15   Temp:      TempSrc:      SpO2: 98% 98% 100% 97%  Weight:      Height:        Constitutional: NAD, calm, comfortable, communicating well Eyes: PERRL, lids and conjunctivae normal ENMT: Mucous membranes are moist. Posterior pharynx clear of any exudate or lesions.Normal dentition.  Neck: normal, supple, no masses, no thyromegaly Respiratory: clear to auscultation bilaterally, no wheezing, no crackles. Normal respiratory effort. No accessory muscle use.  Cardiovascular: Regular rate and rhythm, no murmurs / rubs / gallops. No extremity edema. 2+ pedal pulses. No carotid bruits.  Abdomen: no tenderness, no masses palpated. No hepatosplenomegaly. Bowel sounds positive.  Musculoskeletal: Neck tenderness positive.able to move right & neck Skin: no rashes, lesions, ulcers. No induration Neurologic: CN 2-12 grossly intact. Sensation intact, DTR normal. Strength 5/5 in all 4.  Psychiatric: Normal judgment and insight. Alert and oriented x 3. Normal mood.    Labs on Admission: I have personally reviewed following labs and imaging studies  CBC: Recent Labs  Lab 02/21/20 1058  WBC 20.8*  NEUTROABS 17.8*  HGB 9.7*  HCT 29.6*  MCV 90.5  PLT 99991111   Basic Metabolic Panel: Recent Labs  Lab 02/21/20 1058  NA 135  K 3.7   CL 101  CO2 22  GLUCOSE 117*  BUN 15  CREATININE 0.94  CALCIUM 8.1*   GFR: Estimated Creatinine Clearance: 43.5 mL/min (by C-G formula based on SCr of 0.94 mg/dL). Liver Function Tests: Recent Labs  Lab 02/21/20 1058  AST 22  ALT 34  ALKPHOS 103  BILITOT 0.7  PROT 6.1*  ALBUMIN 2.3*   No results for input(s): LIPASE, AMYLASE in the last 168 hours. No results for input(s): AMMONIA in the last 168 hours. Coagulation Profile: No results for input(s): INR, PROTIME in the last 168 hours. Cardiac Enzymes: No results for input(s): CKTOTAL, CKMB, CKMBINDEX, TROPONINI in the last 168 hours. BNP (last 3 results) No results for input(s): PROBNP in the last 8760 hours. HbA1C: No results for input(s): HGBA1C in the last 72 hours. CBG: No results for input(s): GLUCAP in the last 168 hours. Lipid Profile: No results for input(s): CHOL, HDL, LDLCALC, TRIG, CHOLHDL, LDLDIRECT in the last 72 hours. Thyroid Function Tests: No results for input(s): TSH, T4TOTAL, FREET4, T3FREE, THYROIDAB in the last 72 hours. Anemia Panel: No results  for input(s): VITAMINB12, FOLATE, FERRITIN, TIBC, IRON, RETICCTPCT in the last 72 hours. Urine analysis:    Component Value Date/Time   COLORURINE YELLOW 02/21/2020 1044   APPEARANCEUR CLEAR 02/21/2020 1044   LABSPEC 1.010 02/21/2020 1044   PHURINE 7.0 02/21/2020 [place] 02/21/2020 1044   HGBUR NEGATIVE 02/21/2020 1044   BILIRUBINUR NEGATIVE 02/21/2020 1044   [place] 02/21/2020 1044   PROTEINUR NEGATIVE 02/21/2020 1044   NITRITE NEGATIVE 02/21/2020 1044   LEUKOCYTESUR NEGATIVE 02/21/2020 1044    Radiological Exams on Admission: DG Chest 2 View  Result Date: 02/20/2020 CLINICAL DATA:  Fever and chills EXAM: CHEST - 2 VIEW COMPARISON:  February 16, 2006. 09/23/2013 FINDINGS: There is blunting of the costophrenic angles bilaterally. There is no pneumothorax. No large focal infiltrate. The heart size is normal. There is no  acute osseous abnormality. There are degenerative changes throughout the visualized thoracolumbar spine. Aortic calcifications are noted. IMPRESSION: Blunting of the costophrenic angles bilaterally, which may represent small effusions. No focal infiltrate. Electronically Signed   By: Constance Holster M.D.   On: 02/20/2020 15:18   MR Cervical Spine W or Wo Contrast  Result Date: 02/21/2020 CLINICAL DATA:  Neck pain and fever, rule out infection. EXAM: MRI CERVICAL SPINE WITHOUT AND WITH CONTRAST TECHNIQUE: Multiplanar and multiecho pulse sequences of the cervical spine, to include the craniocervical junction and cervicothoracic junction, were obtained without and with intravenous contrast. CONTRAST:  4.51mL GADAVIST GADOBUTROL 1 MMOL/ML IV SOLN COMPARISON:  Neck CT 09/22/2013 FINDINGS: Multiple sequences are moderate to severely motion degraded, limiting evaluation. Alignment: C3-C4 grade 1 retrolisthesis. C3-C4 grade 1 anterolisthesis. 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 There is marked multilevel degenerative endplate irregularity. There is T1 low signal as well as enhancement within the C4, C5, C6 and C7 vertebrae. Additionally, there is prominent marrow edema and enhancement along the C4-C5 and C5-C6 facet joints on the left. Cord: No definite spinal cord signal abnormality or abnormal cord enhancement is identified within the limitations of significant motion degradation. There is extensive ventral epidural phlegmon spanning the C2-T1 levels. This measures up to 5 mm in greatest AP dimension at the C6-C7 level where there also appears to be formed ventral epidural abscess (series 9, image 10) (series 10, image 25). Abnormal enhancement extends into the neural foramina bilaterally at multiple levels. Posterior Fossa, vertebral arteries, paraspinal tissues: There is extensive ventrolateral paravertebral soft tissue swelling, edema and enhancement  consistent extending from the level of the skull base to the T1 level. The prevertebral soft tissues are thickened to 9 mm. Findings are most consistent with cellulitis/phlegmon at this site. There are small foci of hypoenhancement ventral to the C6 and C7 vertebrae which may reflect relatively spared musculature. Early small abscesses are not excluded Disc levels: Severe multilevel disc degeneration. C2-C3: Congenital fusion. Ventral epidural phlegmon. No significant spinal canal or neural foraminal narrowing. C3-C4: Posterior disc osteophyte complex. Uncinate, facet and ligamentum flavum hypertrophy. Ventral epidural phlegmon. Moderate to moderately advanced spinal canal stenosis. Mild left neural foraminal narrowing. C4-C5: Posterior disc osteophyte complex. Uncinate, facet and ligamentum flavum hypertrophy. Ventral epidural phlegmon. Moderate to moderately advanced spinal canal stenosis. Moderate left neural foraminal narrowing C5-C6: Posterior disc osteophyte complex. Uncinate/facet hypertrophy. Ventral epidural phlegmon. Moderate spinal canal stenosis. Mild bilateral neural foraminal narrowing. C6-C7: Posterior disc osteophyte complex. Ventral epidural abscess. Bilateral disc osteophyte ridge/uncinate hypertrophy. Facet hypertrophy. Ventral epidural abscess. Moderate spinal canal stenosis. Moderate bilateral neural  foraminal narrowing (greater on the right). C7-T1: Right-sided disc osteophyte ridge. Facet hypertrophy. No significant spinal canal stenosis. Moderate right with mild left neural foraminal narrowing. These results were called by telephone at the time of interpretation on 02/21/2020 at 2:33 pm to provider Dr. Gustavus Messing, who verbally acknowledged these results. IMPRESSION: Multiple sequences are moderate to severely motion degraded, limiting evaluation. 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 Findings consistent with extensive soft tissue infection within the neck. This  includes ventrolateral prevertebral cellulitis/phlegmon extending from the skull base to the T1 level. There are small foci of hypoenhancement anterior to the C6 and C7 vertebrae which may reflect relatively spared musculature. Small abscesses are not excluded. Ventral epidural phlegmon spanning the C2-T1 levels. This measures up to 8 mm in greatest AP dimension at the C6-C7 level where there also appears to be formed ventral epidural abscess. Abnormal enhancement extends into the bilateral neural foramina at multiple levels. Edema and enhancement within the C4, C5, C6 and C7 vertebrae. Prominent marrow edema and enhancement are also present along the C4-C5 and C5-C6 facet joints on the left. Findings are highly suspicious for osteomyelitis given the constellation of findings, although there is moderate to advanced cervical spondylosis. Multilevel spinal canal stenosis greatest at C3-C4 and C4-C5 (moderate to moderately advanced at these levels). Sites of mild and moderate neural foraminal narrowing as described. Electronically Signed   By: Kellie Simmering DO   On: 02/21/2020 14:35   DG Chest Port 1 View  Result Date: 02/21/2020 CLINICAL DATA:  Fever and cough over the last 10 days. EXAM: PORTABLE CHEST 1 VIEW COMPARISON:  02/20/2020 FINDINGS: The heart size and mediastinal contours are within normal limits. Both lungs are clear. The visualized skeletal structures are unremarkable. IMPRESSION: No active disease. Electronically Signed   By: Nelson Chimes M.D.   On: 02/21/2020 10:55    EKG: Independently reviewed. Sinus tachycardia. Borderline right axis deviation. Probable anteroseptal infarct, old  Assessment/Plan Principal Problem:   Sepsis ([place]) Active Problems:   Depression   Anemia   Abscess in epidural space of cervical spine   Eosinophilic esophagitis   Sepsis in the setting of prevertebral abscess/cellulitis/osteomyelitis at the level of C4-C5-C6 and C7: -MRI of the cervical spine as  above. -Patient presented with high-grade fever, tachycardic, tachypneic, leukocytosis of 20.8.  Lactic acid: WNL -Admit patient on the floor for close monitoring. -Blood culture is obtained and is pending.  Received clindamycin 900 mg IV once in the ED. -ENT consulted by EDP-recommended IV antibiotics. -Discussed with ID on-call [place].  Will start patient on IV Zosyn -Tylenol and Dilaudid as needed for pain control.  Continue gabapentin, methocarbamol. -PT/OT consulted -Fall precautions. -Neurochecks.  Depression: Continue trazodone and Xanax as needed  Eosinophilic esophagitis: Continue PPI and Pulmicort  Anemia: H&H is 9.7/29.6 -She takes iron supplements at home.  Will continue same. -Monitor H&H closely.  No history of melena.  She takes naproxen at home. -Check iron studies.  DVT prophylaxis: Lovenox, SCD/TED Code Status: Full code-confirmed with the patient Family Communication: None present at bedside.  Plan of care discussed with patient in length and she verbalized understanding and agreed with it. Disposition Plan: To be determined Consults called: ENT by EDP Admission status: Inpatient   Mckinley Jewel MD [facility] Pager [phone]  If 7PM-7AM, please contact night-coverage [email or web address] Password [facility]  02/21/2020, 6:48 PM

## 2020-02-21 NOTE — ED Triage Notes (Signed)
Pt arrives to ED by POV with c/o gen body aches for about a week and a half accompanied by fever than began about 2 days ago. Pt states her PCP did a cxr yesterday and told her it was negative; flu and COVID tests done Wednesday with neg results. PCP did not prescribe medications, drew labs and told pt he would f/u with results. Pt currently febrile 101.7, tachycardic 106HR. Denies n/v/d/h. Reports nonproductive cough, occasional epigastric discomfort, and shortness of breath upon waking that resolves after about 10-15 mins. . States she took 1300 mg OTC "arthritis strength" tylenol at 07:30AM today. A&Ox4. Has rec'd COVID vaccines x2.

## 2020-02-21 NOTE — ED Provider Notes (Signed)
[facility] EMERGENCY DEPARTMENT Provider Note   CSN: [id number] Arrival date & time: 02/21/20  0932     History Chief Complaint  Patient presents with  . Generalized Body Aches  . Fever    Megan Cox is a 69 y.o. female.  HPI  69 year-old female with a history of eosinophilic esophagitis, Mallory-Weiss tears x2, osteoarthritis, anxiety presents today to the ER with fever and generalized body aches that started on Wednesday 3/17.  She also endorses bilateral shoulder pain and neck pain that has been going on since Monday, March 8.  Reports intermittent upper arm weakness and upper leg weakness bilaterally.  Episodes do not last longer than 10 to 15 minutes, mostly in the morning.  Weakness is worse in the morning, but reports occasional episodes of weakness throughout the day.  On Friday 3/12 she called EMS because her neck pain was so bad.  She had just seen Dr. Maxie Better who she refers as her orthopedic doctor the day before and he had prescribed her prednisone which she had not started.  She says that EMS denied transportation since she had just seen an orthopedic doctor and has not started her prednisone.  She started prednisone on Saturday 3/13.  She refers that her pain is actually improved right now, but when it comes on it is extremely painful and brings her to tears.  Fever started on Wednesday 3/17, with increased shortness of breath, some coughing, no hematemesis.  She endorses chest discomfort/tightness as of yesterday and today, worse with inhalation, describes it as a "sore muscle".  Of note, patient received a Covid vaccine on 02-11-20.  She also states that her right leg started shaking for approximately 10 to 15 minutes yesterday and stopped.  She has not had another episode of this shaking.  She also endorses producing more urine than normal but denies frequency, dysuria, hematuria  Other associated symptoms include bilateral hip pain that waxes and wanes which  she describes as medium and sharp, worse with movement.  She also states she has sore muscles in her throat, but denies any difficulty swallowing.  She had an episode of similar neck pain that happened the Monday before Christmas, but then it was more left-sided that radiated up her neck and into her head she had no fevers.  This lasted approximately 2 months.  She saw Dr. Maxie Better, and he sent her to physical therapy.  She states she has been compliant with her at home exercises, which is why she is surprised that her pain has come back so quickly and sharply.  She denies unilateral weakness, nausea, vomiting, dizziness, abdominal pain, rashes, vision changes, light sensitivity, sick contacts, loss of taste and smell, palpitations, syncope, dysuria, hematuria, diarrhea.  Denies use of alcohol, drugs, smoking  She is taking gabapentin, robaxin and prednisone for neck pain. Finished last dose of this pack today. Dr. Weston Settle PCP. Bean had prescribed 2nd pack of prednisone but Laurann Montana told her to hold off until the source of her fever was identified.  Patient states Dr. Delene Ruffini was concerned for abscess near spine/polymyalgia rheumatica.   Past Medical History:  Diagnosis Date  . Depression     Patient Active Problem List   Diagnosis Date Noted  . Depression   . Sepsis ([place])   . Anemia   . Abscess in epidural space of cervical spine   . Dysphagia 09/22/2013  . Foreign body in esophagus 09/22/2013    Past Surgical History:  Procedure Laterality Date  . ESOPHAGOGASTRODUODENOSCOPY  2012  . ESOPHAGOGASTRODUODENOSCOPY N/A 09/22/2013   Procedure: ESOPHAGOGASTRODUODENOSCOPY (EGD);  Surgeon: Lear Ng, MD;  Location: [facility];  Service: Endoscopy;  Laterality: N/A;  . OVARIAN CYST REMOVAL  1973  . TUBAL LIGATION       OB History   No obstetric history on file.     Family History  Problem Relation Age of Onset  . Breast cancer Mother 9    Social History    Tobacco Use  . Smoking status: Never Smoker  . Smokeless tobacco: Never Used  Substance Use Topics  . Alcohol use: No  . Drug use: Never    Home Medications Prior to Admission medications   Medication Sig Start Date End Date Taking? Authorizing Provider  ALPRAZolam Duanne Moron) 0.25 MG tablet Take 0.25 mg by mouth at bedtime as needed for anxiety.    [provider]  diphenhydrAMINE (BENADRYL) 25 MG tablet Take 25 mg by mouth every 6 (six) hours as needed for itching.    [provider]  fexofenadine (ALLEGRA) 180 MG tablet Take 180 mg by mouth daily.    [provider]  glucosamine-chondroitin 500-400 MG tablet Take 1 tablet by mouth 2 (two) times daily. Cuts in half    [provider]  Multiple Vitamin (MULTIVITAMIN WITH MINERALS) TABS tablet Take 1 tablet by mouth daily.    [provider]  Soy Isoflavone 40 MG TABS Take 2 tablets by mouth daily.    [provider]  traZODone (DESYREL) 50 MG tablet Patient takes 50 mg in the am and 100 mg at bedtime 09/25/13   Nani Skillern, PA-C    Allergies    Patient has no known allergies.  Review of Systems   Review of Systems  Constitutional: Positive for fatigue and fever. Negative for appetite change, chills and unexpected weight change.  HENT: Negative for sore throat, tinnitus, trouble swallowing and voice change.   Eyes: Negative for photophobia, pain and visual disturbance.  Respiratory: Positive for cough, chest tightness and shortness of breath. Negative for wheezing.   Cardiovascular: Positive for chest pain. Negative for palpitations and leg swelling.  Gastrointestinal: Negative for abdominal distention, abdominal pain, diarrhea, nausea and vomiting.  Endocrine: Negative for polydipsia, polyphagia and polyuria.  Genitourinary: Positive for pelvic pain. Negative for decreased urine volume, difficulty urinating, dyspareunia, dysuria, enuresis, flank pain, frequency,  hematuria and urgency.  Musculoskeletal: Positive for arthralgias, back pain, myalgias, neck pain and neck stiffness. Negative for gait problem and joint swelling.  Skin: Negative for color change, pallor, rash and wound.  Neurological: Negative for dizziness, tremors, seizures, syncope, facial asymmetry, speech difficulty, weakness, light-headedness, numbness and headaches.  Psychiatric/Behavioral: The patient is nervous/anxious.   All other systems reviewed and are negative.   Physical Exam Updated Vital Signs BP 122/76   Pulse (!) 101   Temp (!) 102.1 F (38.9 C) (Oral)   Resp 14   Ht 5\' 2"  (1.575 m)   Wt 48.1 kg   SpO2 95%   BMI 19.39 kg/m   Physical Exam Vitals and nursing note reviewed.  Constitutional:      General: She is not in acute distress.    Appearance: Normal appearance. She is not ill-appearing or toxic-appearing.  HENT:     Head: Normocephalic and atraumatic.     Right Ear: External ear normal.     Left Ear: External ear normal.     Nose: Nose normal.     Mouth/Throat:  Mouth: Mucous membranes are moist.     Pharynx: Oropharynx is clear.  Eyes:     Extraocular Movements: Extraocular movements intact.     Conjunctiva/sclera: Conjunctivae normal.     Pupils: Pupils are equal, round, and reactive to light.  Cardiovascular:     Rate and Rhythm: Regular rhythm. Tachycardia present.  Pulmonary:     Effort: Pulmonary effort is normal. No respiratory distress.     Breath sounds: Rhonchi present.  Chest:     Chest wall: No tenderness.  Abdominal:     General: Abdomen is flat. Bowel sounds are normal. There is no distension.     Palpations: Abdomen is soft.     Tenderness: There is no abdominal tenderness. There is no right CVA tenderness, left CVA tenderness or guarding.  Musculoskeletal:        General: Tenderness present.     Cervical back: No rigidity or tenderness.     Comments: Mildly decreased range of motion of upwards and downwards of neck,  otherwise unremarkable musculoskeletal exam. Negative Kernig and Brudzinski's.  Skin:    General: Skin is warm and dry.     Findings: No bruising, erythema, lesion or rash.  Neurological:     General: No focal deficit present.     Mental Status: She is alert.     Cranial Nerves: No cranial nerve deficit.     Sensory: No sensory deficit.     Motor: No weakness.     Coordination: Coordination normal.  Psychiatric:        Mood and Affect: Mood normal.        Behavior: Behavior normal.        Thought Content: Thought content normal.        Judgment: Judgment normal.     ED Results / Procedures / Treatments   Labs (all labs ordered are listed, but only abnormal results are displayed) Labs Reviewed  COMPREHENSIVE METABOLIC PANEL - Abnormal; Notable for the following components:      Result Value   Glucose, Bld 117 (*)    Calcium 8.1 (*)    Total Protein 6.1 (*)    Albumin 2.3 (*)    All other components within normal limits  CBC WITH DIFFERENTIAL/PLATELET - Abnormal; Notable for the following components:   WBC 20.8 (*)    RBC 3.27 (*)    Hemoglobin 9.7 (*)    HCT 29.6 (*)    Neutro Abs 17.8 (*)    Monocytes Absolute 1.9 (*)    Abs Immature Granulocytes 0.23 (*)    All other components within normal limits  RESPIRATORY PANEL BY RT PCR (FLU A&B, COVID)  CULTURE, BLOOD (ROUTINE X 2)  CULTURE, BLOOD (ROUTINE X 2)  URINE CULTURE  URINALYSIS, ROUTINE W REFLEX MICROSCOPIC  LACTIC ACID, PLASMA  POC SARS CORONAVIRUS 2 AG -  ED    EKG EKG Interpretation  Date/Time:  Friday February 21 2020 10:43:37 EDT Ventricular Rate:  108 PR Interval:    QRS Duration: 69 QT Interval:  321 QTC Calculation: 431 R Axis:   84 Text Interpretation: Sinus tachycardia Borderline right axis deviation Probable anteroseptal infarct, old No prior ECG for comparison. No STEMI Confirmed by Antony Blackbird [phone]) on 02/21/2020 11:44:47 AM   Radiology DG Chest 2 View  Result Date: 02/20/2020 CLINICAL  DATA:  Fever and chills EXAM: CHEST - 2 VIEW COMPARISON:  February 16, 2006. 09/23/2013 FINDINGS: There is blunting of the costophrenic angles bilaterally. There is no pneumothorax. No large  focal infiltrate. The heart size is normal. There is no acute osseous abnormality. There are degenerative changes throughout the visualized thoracolumbar spine. Aortic calcifications are noted. IMPRESSION: Blunting of the costophrenic angles bilaterally, which may represent small effusions. No focal infiltrate. Electronically Signed   By: Constance Holster M.D.   On: 02/20/2020 15:18   MR Cervical Spine W or Wo Contrast  Result Date: 02/21/2020 CLINICAL DATA:  Neck pain and fever, rule out infection. EXAM: MRI CERVICAL SPINE WITHOUT AND WITH CONTRAST TECHNIQUE: Multiplanar and multiecho pulse sequences of the cervical spine, to include the craniocervical junction and cervicothoracic junction, were obtained without and with intravenous contrast. CONTRAST:  4.36mL GADAVIST GADOBUTROL 1 MMOL/ML IV SOLN COMPARISON:  Neck CT 09/22/2013 FINDINGS: Multiple sequences are moderate to severely motion degraded, limiting evaluation. Alignment: C3-C4 grade 1 retrolisthesis. C3-C4 grade 1 anterolisthesis. 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 There is marked multilevel degenerative endplate irregularity. There is T1 low signal as well as enhancement within the C4, C5, C6 and C7 vertebrae. Additionally, there is prominent marrow edema and enhancement along the C4-C5 and C5-C6 facet joints on the left. Cord: No definite spinal cord signal abnormality or abnormal cord enhancement is identified within the limitations of significant motion degradation. There is extensive ventral epidural phlegmon spanning the C2-T1 levels. This measures up to 5 mm in greatest AP dimension at the C6-C7 level where there also appears to be formed ventral epidural abscess (series 9, image 10) (series 10,  image 25). Abnormal enhancement extends into the neural foramina bilaterally at multiple levels. Posterior Fossa, vertebral arteries, paraspinal tissues: There is extensive ventrolateral paravertebral soft tissue swelling, edema and enhancement consistent extending from the level of the skull base to the T1 level. The prevertebral soft tissues are thickened to 9 mm. Findings are most consistent with cellulitis/phlegmon at this site. There are small foci of hypoenhancement ventral to the C6 and C7 vertebrae which may reflect relatively spared musculature. Early small abscesses are not excluded Disc levels: Severe multilevel disc degeneration. C2-C3: Congenital fusion. Ventral epidural phlegmon. No significant spinal canal or neural foraminal narrowing. C3-C4: Posterior disc osteophyte complex. Uncinate, facet and ligamentum flavum hypertrophy. Ventral epidural phlegmon. Moderate to moderately advanced spinal canal stenosis. Mild left neural foraminal narrowing. C4-C5: Posterior disc osteophyte complex. Uncinate, facet and ligamentum flavum hypertrophy. Ventral epidural phlegmon. Moderate to moderately advanced spinal canal stenosis. Moderate left neural foraminal narrowing C5-C6: Posterior disc osteophyte complex. Uncinate/facet hypertrophy. Ventral epidural phlegmon. Moderate spinal canal stenosis. Mild bilateral neural foraminal narrowing. C6-C7: Posterior disc osteophyte complex. Ventral epidural abscess. Bilateral disc osteophyte ridge/uncinate hypertrophy. Facet hypertrophy. Ventral epidural abscess. Moderate spinal canal stenosis. Moderate bilateral neural foraminal narrowing (greater on the right). C7-T1: Right-sided disc osteophyte ridge. Facet hypertrophy. No significant spinal canal stenosis. Moderate right with mild left neural foraminal narrowing. These results were called by telephone at the time of interpretation on 02/21/2020 at 2:33 pm to provider Dr. Gustavus Messing, who verbally acknowledged these results.  IMPRESSION: Multiple sequences are moderate to severely motion degraded, limiting evaluation. 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 Findings consistent with extensive soft tissue infection within the neck. This includes ventrolateral prevertebral cellulitis/phlegmon extending from the skull base to the T1 level. There are small foci of hypoenhancement anterior to the C6 and C7 vertebrae which may reflect relatively spared musculature. Small abscesses are not excluded. Ventral epidural phlegmon spanning the C2-T1 levels. This measures up to 8  mm in greatest AP dimension at the C6-C7 level where there also appears to be formed ventral epidural abscess. Abnormal enhancement extends into the bilateral neural foramina at multiple levels. Edema and enhancement within the C4, C5, C6 and C7 vertebrae. Prominent marrow edema and enhancement are also present along the C4-C5 and C5-C6 facet joints on the left. Findings are highly suspicious for osteomyelitis given the constellation of findings, although there is moderate to advanced cervical spondylosis. Multilevel spinal canal stenosis greatest at C3-C4 and C4-C5 (moderate to moderately advanced at these levels). Sites of mild and moderate neural foraminal narrowing as described. Electronically Signed   By: Kellie Simmering DO   On: 02/21/2020 14:35   DG Chest Port 1 View  Result Date: 02/21/2020 CLINICAL DATA:  Fever and cough over the last 10 days. EXAM: PORTABLE CHEST 1 VIEW COMPARISON:  02/20/2020 FINDINGS: The heart size and mediastinal contours are within normal limits. Both lungs are clear. The visualized skeletal structures are unremarkable. IMPRESSION: No active disease. Electronically Signed   By: Nelson Chimes M.D.   On: 02/21/2020 10:55    Procedures Procedures (including critical care time)  Medications Ordered in ED Medications  clindamycin (CLEOCIN) IVPB 900 mg (900 mg Intravenous New Bag/Given (Non-Interop) 02/21/20 1614)   sodium chloride 0.9 % bolus 1,000 mL (0 mLs Intravenous Stopped 02/21/20 1400)  LORazepam (ATIVAN) injection 1 mg (1 mg Intravenous Given 02/21/20 1139)  gadobutrol (GADAVIST) 1 MMOL/ML injection 4.5 mL (4.5 mLs Intravenous Contrast Given 02/21/20 1318)  acetaminophen (TYLENOL) tablet 650 mg (650 mg Oral Given 02/21/20 1431)    ED Course  I have reviewed the triage vital signs and the nursing notes.  Pertinent labs & imaging results that were available during my care of the patient were reviewed by me and considered in my medical decision making (see chart for details).  Clinical Course as of Feb 20 1625  Fri Feb 21, 2020  1351 WBC is elevated at 20.8.  Unclear if this is due to infectious process or refractory to recent prednisone use.  MRI cervical pending.  Chest x-ray clear.  But negative.   [MB]  [id number] Spoke to Dr. Doristine Bosworth who agrees to admit patient for further treatment   [CA]    Clinical Course User Index [CA] Suzy Bouchard, PA-C [MB] Lyndel Safe   MDM Rules/Calculators/A&P                     69 year old female with a history of eosinophilic esophagitis and Mallory-Weiss tears x2 presents to the ER with a 3-day history history of fevers, neck pain that started on Monday, March 8. -DDX: Cervical abscess/infection, PMR, meningitis, Covid, dissection, cervical discogenic injury, malignancy -Case discussed with Dr. Sherry Ruffing and Benedetto Goad, PA-C. -Temp 101.7 upon admission, continues to have fevers.  Tachycardic, mildly hypotensive at admission,but pressures have stabilized through ED course. -WBC is 20.8, could be indicative of infectious process but also could be refractive to recent steroid use. Covid negative.  Urine culture pending.  Initial lactic acid normal.  Blood cultures pending.  UA negative. -Chest x-ray shows no acute cardiopulmonary disease, C-spine MRI showed extensive soft tissue infection within the neck w/enteral epidural phlegmon spanning from  C2-T1. -Ford PA-C consulted Dr. Saintclair Halsted w/neurosurgery, since the abscess is ventral, they commended ENT/internal medicine consult.  After consulting Dr. Benjamine Mola ENT, the patient will be started on IV clindamycin and he will come see the patient.  She will be admitted to internal medicine  for close follow-up. Final Clinical Impression(s) / ED Diagnoses Final diagnoses:  Abscess in epidural space of cervical spine    Rx / DC Orders ED Discharge Orders    None       Lyndel Safe 02/21/20 1628    Tegeler, Gwenyth Allegra, MD 02/21/20 [phone]

## 2020-02-22 ENCOUNTER — Inpatient Hospital Stay (HOSPITAL_COMMUNITY): Payer: Medicare PPO

## 2020-02-22 DIAGNOSIS — B9561 Methicillin susceptible Staphylococcus aureus infection as the cause of diseases classified elsewhere: Secondary | ICD-10-CM

## 2020-02-22 DIAGNOSIS — M4622 Osteomyelitis of vertebra, cervical region: Secondary | ICD-10-CM

## 2020-02-22 DIAGNOSIS — K200 Eosinophilic esophagitis: Secondary | ICD-10-CM

## 2020-02-22 DIAGNOSIS — Z91041 Radiographic dye allergy status: Secondary | ICD-10-CM

## 2020-02-22 DIAGNOSIS — F329 Major depressive disorder, single episode, unspecified: Secondary | ICD-10-CM

## 2020-02-22 DIAGNOSIS — G061 Intraspinal abscess and granuloma: Secondary | ICD-10-CM

## 2020-02-22 DIAGNOSIS — R7881 Bacteremia: Secondary | ICD-10-CM

## 2020-02-22 LAB — CBC
HCT: 29.8 % — ABNORMAL LOW (ref 36.0–46.0)
Hemoglobin: 9.5 g/dL — ABNORMAL LOW (ref 12.0–15.0)
MCH: 29.3 pg (ref 26.0–34.0)
MCHC: 31.9 g/dL (ref 30.0–36.0)
MCV: 92 fL (ref 80.0–100.0)
Platelets: 273 10*3/uL (ref 150–400)
RBC: 3.24 MIL/uL — ABNORMAL LOW (ref 3.87–5.11)
RDW: 14.5 % (ref 11.5–15.5)
WBC: 20.2 10*3/uL — ABNORMAL HIGH (ref 4.0–10.5)
nRBC: 0 % (ref 0.0–0.2)

## 2020-02-22 LAB — BLOOD CULTURE ID PANEL (REFLEXED)

## 2020-02-22 LAB — BASIC METABOLIC PANEL
Anion gap: 13 (ref 5–15)
BUN: 13 mg/dL (ref 8–23)
CO2: 21 mmol/L — ABNORMAL LOW (ref 22–32)
Calcium: 7.9 mg/dL — ABNORMAL LOW (ref 8.9–10.3)
Chloride: 102 mmol/L (ref 98–111)
Creatinine, Ser: 0.97 mg/dL (ref 0.44–1.00)
GFR calc Af Amer: >60 mL/min (ref >60)
GFR calc non Af Amer: >60 mL/min (ref >60)
Glucose, Bld: 103 mg/dL — ABNORMAL HIGH (ref 70–99)
Potassium: 3.4 mmol/L — ABNORMAL LOW (ref 3.5–5.1)
Sodium: 136 mmol/L (ref 135–145)

## 2020-02-22 IMAGING — MR MR THORACIC SPINE W/O CM
5 of 9 series · 20 of 48 positions shown · non-contrast
Comparison: MRI cervical spine [DATE]

CLINICAL DATA: MSSA bacteremia, leg weakness. Cervical infection

EXAM:
MRI THORACIC AND LUMBAR SPINE WITHOUT CONTRAST
TECHNIQUE: Multiplanar and multiecho pulse sequences of the thoracic and lumbar
spine were obtained without intravenous contrast.

[Series 18: T1 · sagittal · 3.3mm · 0.62mm/px · 3 of 9 slices shown (1 of 2)]
[im 1/9]
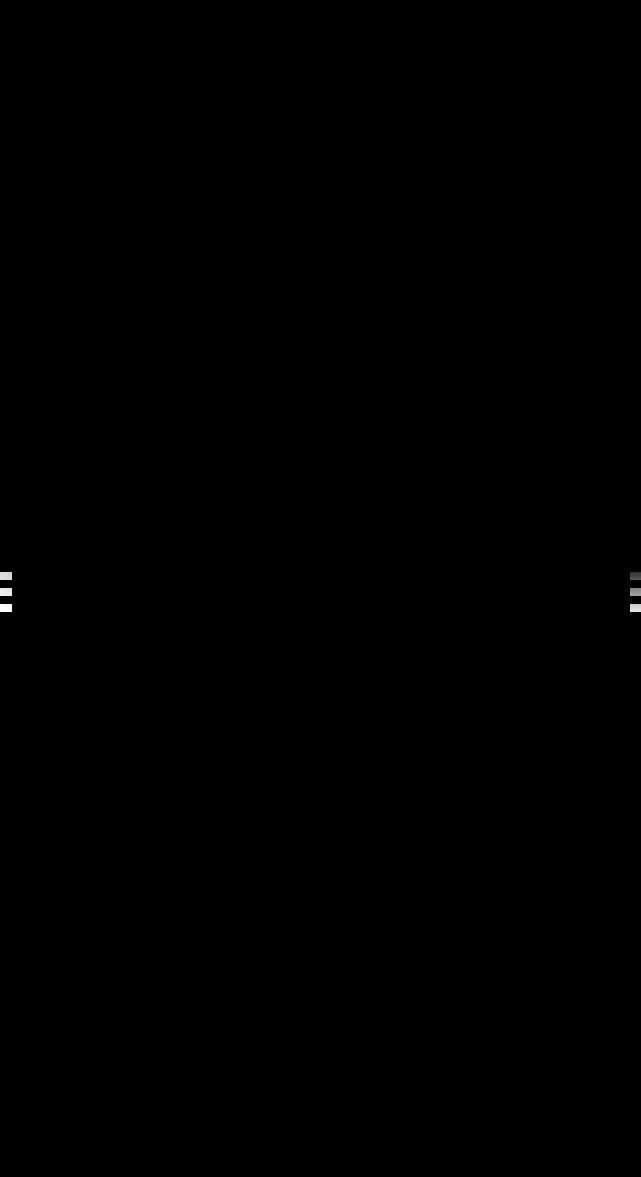
[im 5/9]
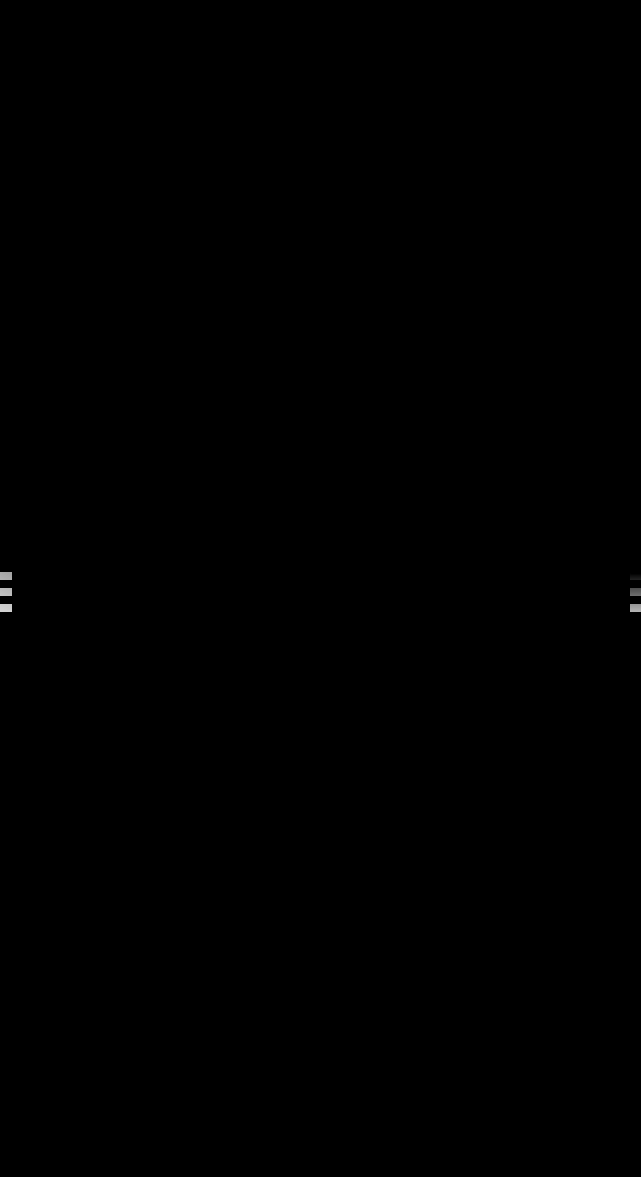
[im 9/9]
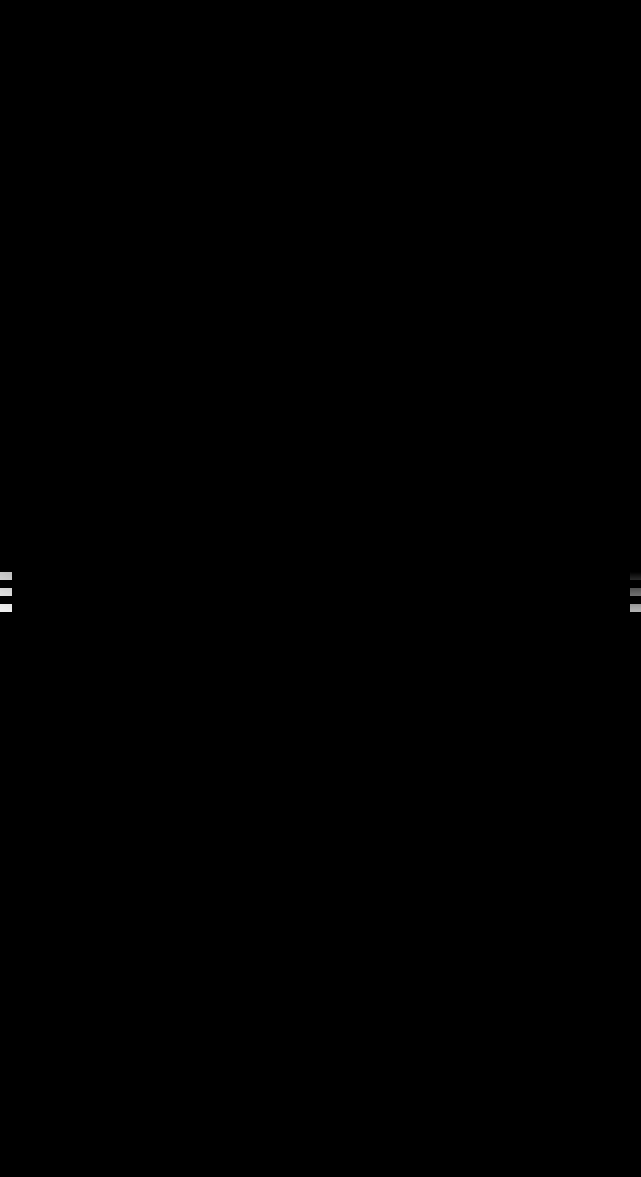

[Series 19: T2 · sagittal · 3.0mm · 0.76mm/px · 4 of 17 slices shown (1 of 3)]
[im 1/17]
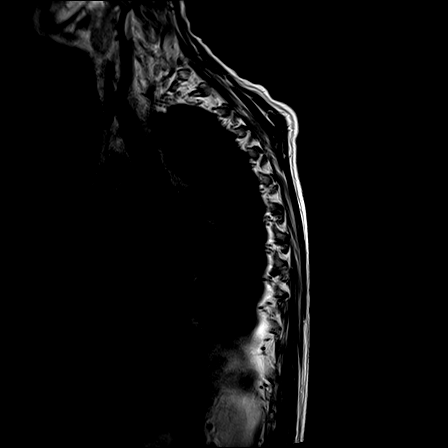
[im 6/17]
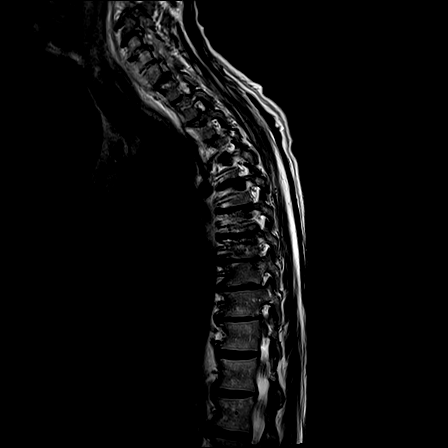
[im 11/17]
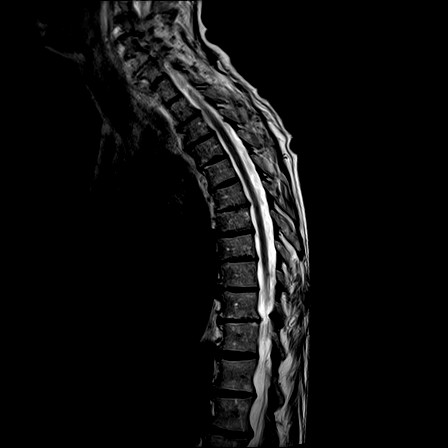
[im 17/17]
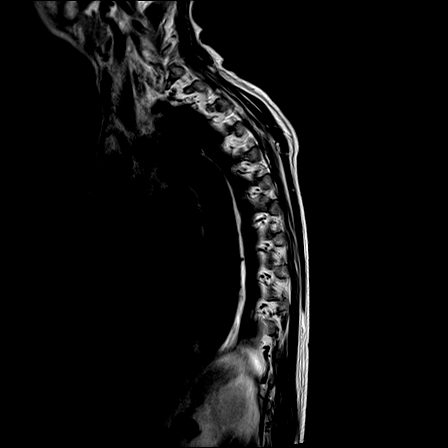

[Series 20: T1 · sagittal · 3.0mm · 0.76mm/px · 1 of 17 slices shown (2 of 2)]
[im 1/17]
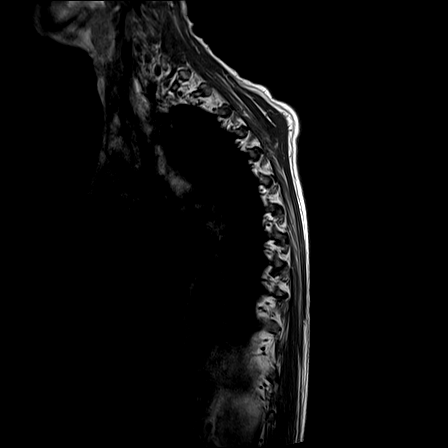

[Series 22: T2 · axial · 5.0mm · 0.59mm/px · z∈[-172,+71]mm · 4 of 17 slices shown (2 of 3)]
[im 1/17]
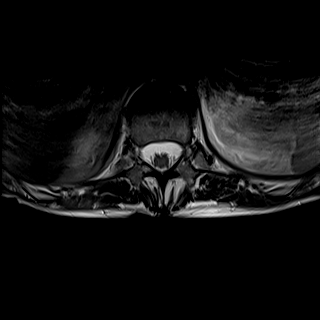
[im 6/17]
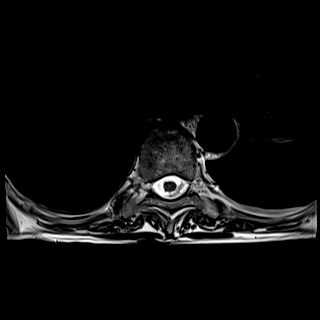
[im 11/17]
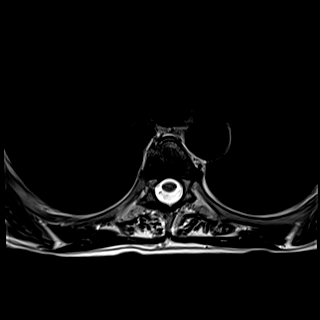
[im 17/17]
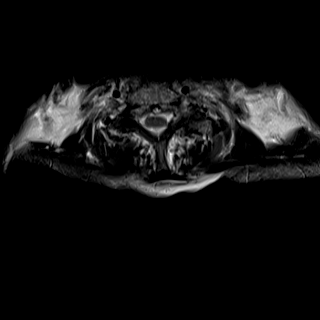

[Series 23: T2 · axial · 5.0mm · 0.59mm/px · z∈[-180,+71]mm · 8 of 34 slices shown (3 of 3)]
[im 1/34]
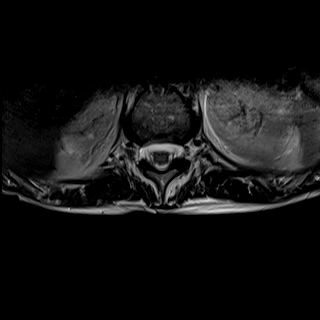
[im 5/34]
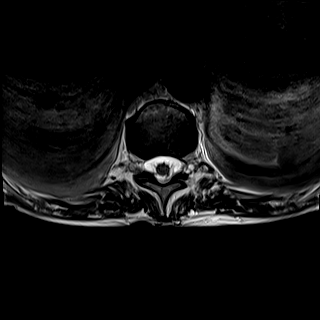
[im 10/34]
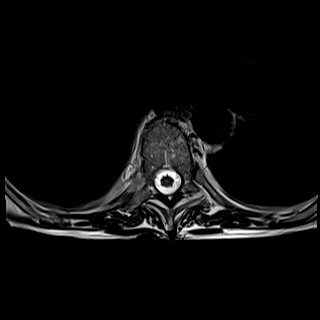
[im 15/34]
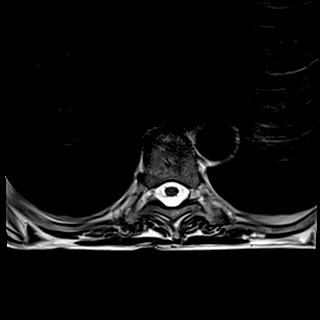
[im 19/34]
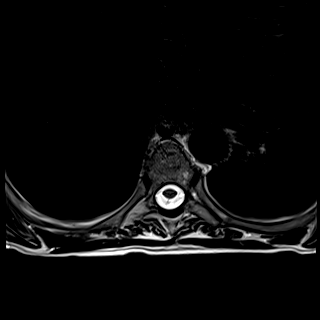
[im 24/34]
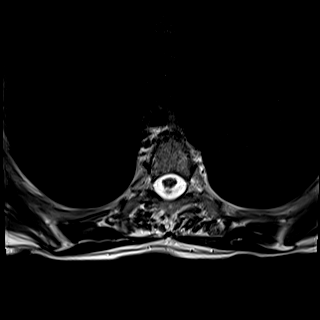
[im 29/34]
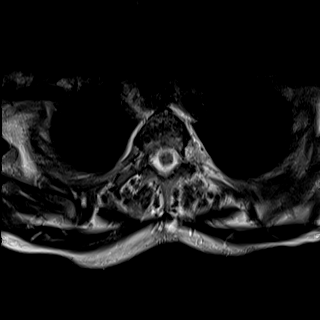
[im 34/34]
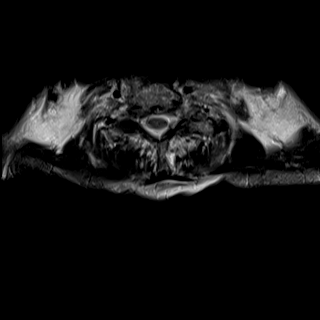

[20 of 48 positions shown; findings below may reference images not displayed]

FINDINGS: A portion of the cervical spine was included within the field of
view on the sagittal images again demonstrating extensive infectious
findings including prominent prevertebral phlegmon extending from C2
to T1 (series 21, image 8). Ventral epidural phlegmonous changes are
also noted from the C2 level extending down to approximately T1-T2
(series 21, image 9). Fluid signal within the C5-6 and C6-7 discs
with marrow edema in the C5, C6, and C7 vertebral bodies compatible
with discitis-osteomyelitis.

MRI THORACIC SPINE FINDINGS

Alignment: 2-3 mm grade 1 anterolisthesis T3 on T4. Normal thoracic
kyphosis.

Vertebrae: The thoracic vertebral body heights are maintained
without evidence of fracture. There are a few scattered intraosseous
hemangiomas, notably within the T2 and T7 vertebral bodies. No
evidence of discitis within the thoracic spine. No evidence of
osteomyelitis within the thoracic spine. No suspicious marrow
replacing lesion.

Cord:  Normal signal and morphology.

Paraspinal and other soft tissues: Prevertebral phlegmon and ventral
epidural phlegmon again noted extending from the cervical region
down to the T1-2 level, better characterized on small field-of-view
cervical spine MRI [DATE].

Disc levels:

No significant disc protrusion. No foraminal or canal stenosis is
seen within the thoracic spine.

MRI LUMBAR SPINE FINDINGS

Segmentation:  Standard.

Alignment:  Trace retrolisthesis L3 on L4 and L4 on L5.

Vertebrae: No fracture, evidence of discitis, or bone lesion.
Incidental intraosseous hemangioma within the L1 vertebral body.
Multilevel discogenic endplate marrow changes.

Conus medullaris and cauda equina: Conus extends to the L1-2 level.
Conus and cauda equina appear normal.

Paraspinal and other soft tissues: Negative.

Disc levels:

T12-L1: No significant disc protrusion, foraminal stenosis, or canal
stenosis.

L1-L2: Mild diffuse disc bulge. No foraminal or canal stenosis.

L2-L3: Mild diffuse disc bulge. No foraminal or canal stenosis.

L3-L4: Diffuse disc bulge with bilateral facet arthropathy and
ligamentum flavum buckling resulting in mild canal stenosis and mild
right foraminal stenosis.

There is a lobulated T1 isointense, T2 slightly hyperintense
structure within the ventral epidural space posterior to the L4
reflect a large disc herniation, possibly arising from the L3-4
disc. This causes mild canal stenosis at the L4 level. Material
extends to the L4-5 subarticular recess on the left (series 4, image
17).

L4-L5: Severe left subarticular recess stenosis from extension of
probable disc material. Bilateral facet arthropathy contribute to
mild left foraminal stenosis and mild canal stenosis. There is a
posterior facet synovial cyst on the left at L4-5 (series 2, image
12).

L5-S1: Mild disc bulge and bilateral facet arthrosis result in mild
left foraminal stenosis. No canal stenosis.
IMPRESSION: MRI Thoracic Spine:

1. No evidence for thoracic discitis-osteomyelitis.
2. Cervical discitis-osteomyelitis of C5-6 and C6-7 with extensive
prevertebral phlegmon and ventral epidural phlegmon extending from
C2 to T1-2 level, better characterized on dedicated cervical spine
MRI. No appreciable interval change from prior.
3. No significant disc protrusion, foraminal stenosis, or canal
stenosis.

MRI Lumbar Spine:

1. No evidence for lumbar discitis-osteomyelitis.
2. Mild multilevel spondylosis with mild canal stenosis at L3-4 and
L4-5.
3. Lobulated T1 isointense structure within the ventral epidural
space posterior to the L4 vertebral body which may reflect a large
disc herniation, possibly arising from the L3-4 disc. This causes
mild canal stenosis at L4 level and severe left L4-5 subarticular
recess stenosis.

## 2020-02-22 IMAGING — MR MR LUMBAR SPINE W/O CM
4 of 5 series · 26 of 48 positions shown · non-contrast
Comparison: MRI cervical spine [DATE]

CLINICAL DATA: MSSA bacteremia, leg weakness. Cervical infection

EXAM:
MRI THORACIC AND LUMBAR SPINE WITHOUT CONTRAST
TECHNIQUE: Multiplanar and multiecho pulse sequences of the thoracic and lumbar
spine were obtained without intravenous contrast.

[Series 1: T2 · sagittal · 4.0mm · 0.68mm/px · 9 of 17 slices shown (1 of 2)]
[im 1/17]
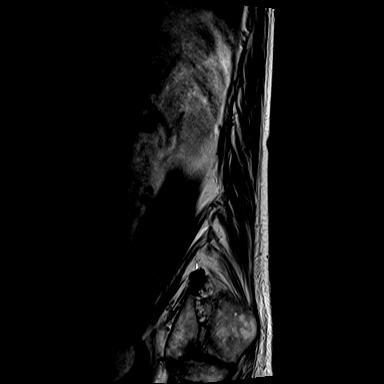
[im 3/17]
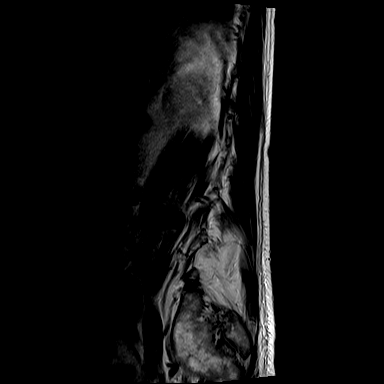
[im 5/17]
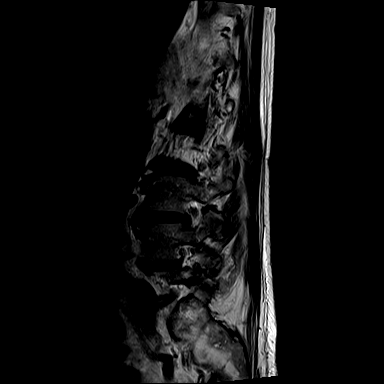
[im 7/17]
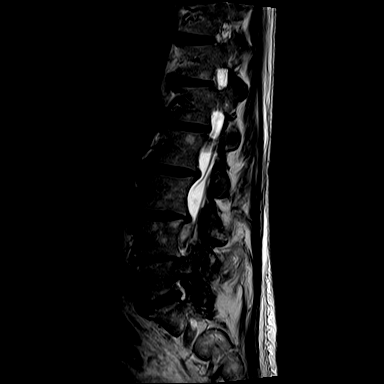
[im 9/17]
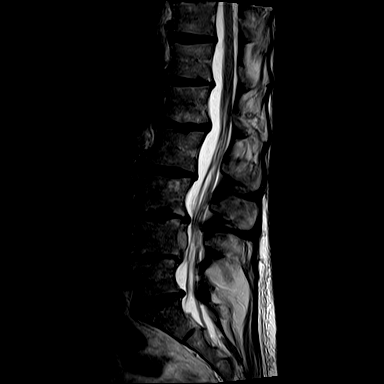
[im 11/17]
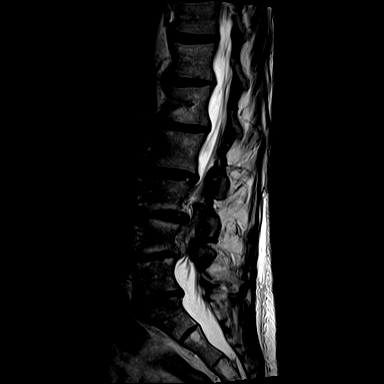
[im 13/17]
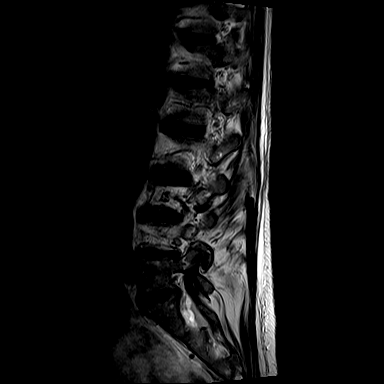
[im 15/17]
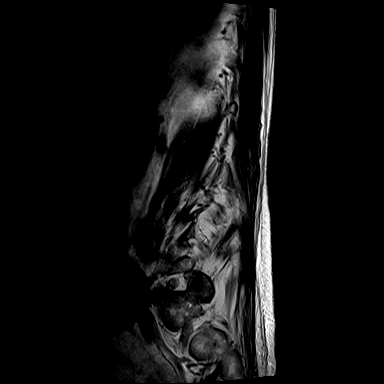
[im 17/17]
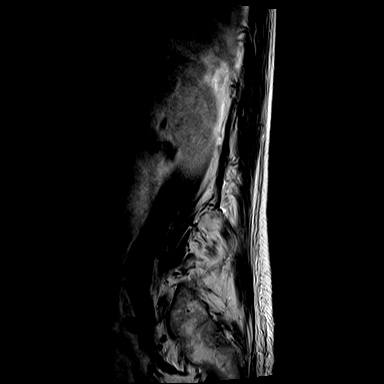

[Series 3: T1 · sagittal · 4.0mm · 0.81mm/px · 3 of 17 slices shown (1 of 2)]
[im 3/17]
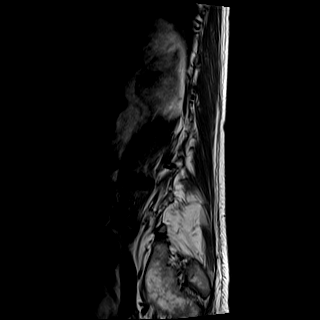
[im 10/17]
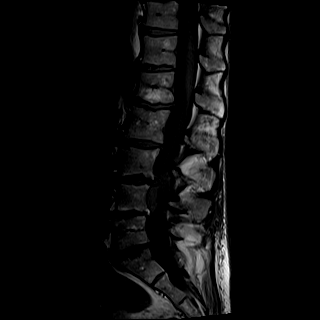
[im 14/17]
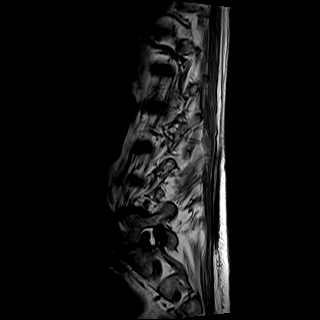

[Series 4: T2 · axial · 5.0mm · 0.57mm/px · z∈[-322,-149]mm · 11 of 23 slices shown (2 of 2)]
[im 1/23]
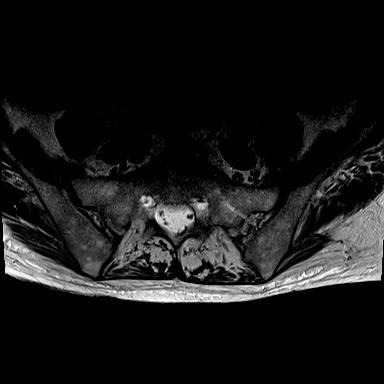
[im 3/23]
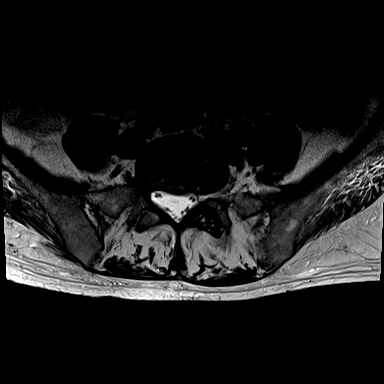
[im 5/23]
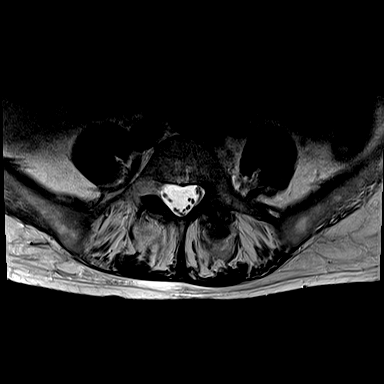
[im 7/23]
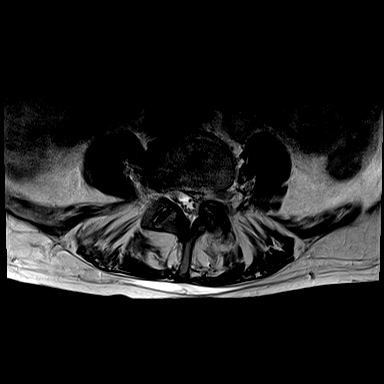
[im 9/23]
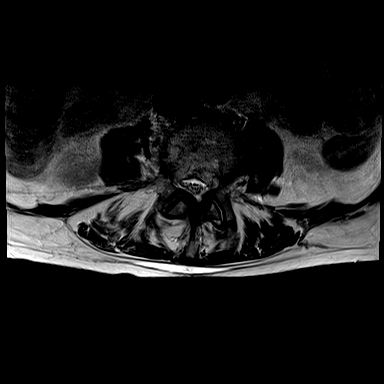
[im 12/23]
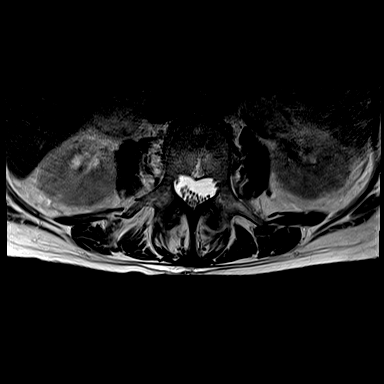
[im 14/23]
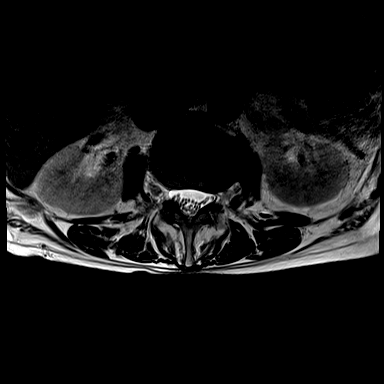
[im 16/23]
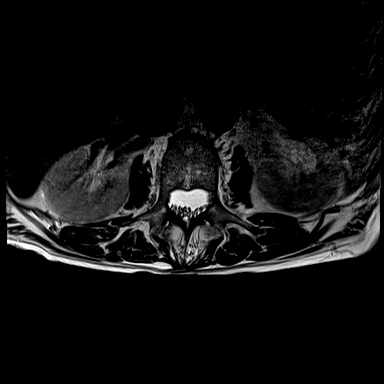
[im 18/23]
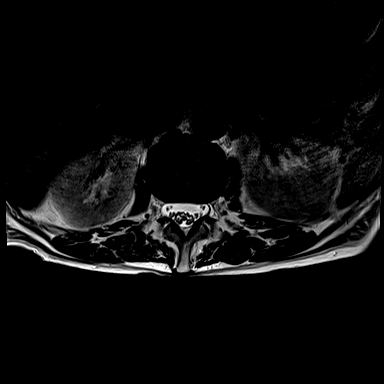
[im 20/23]
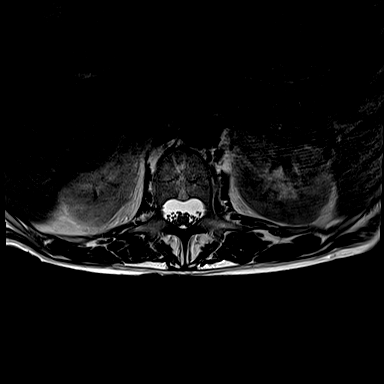
[im 23/23]
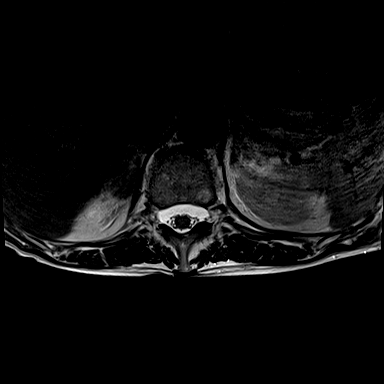

[Series 5: T1 · axial · 5.0mm · 0.34mm/px · z∈[-307,-171]mm · 3 of 23 slices shown (2 of 2)]
[im 3/23]
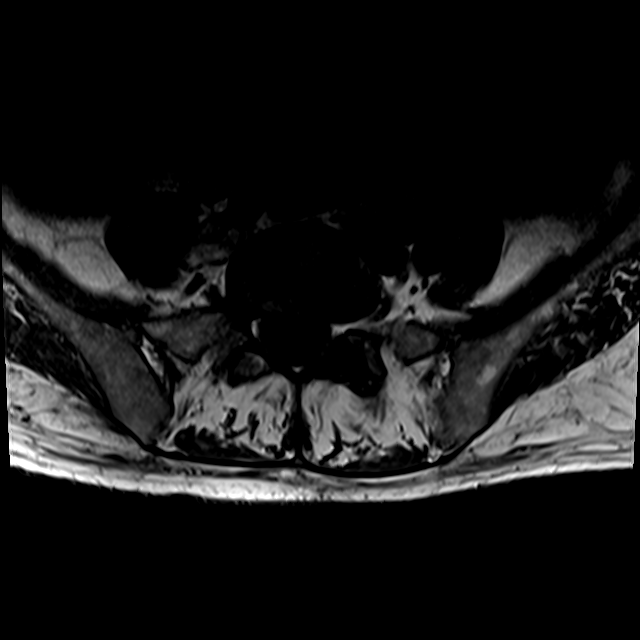
[im 12/23]
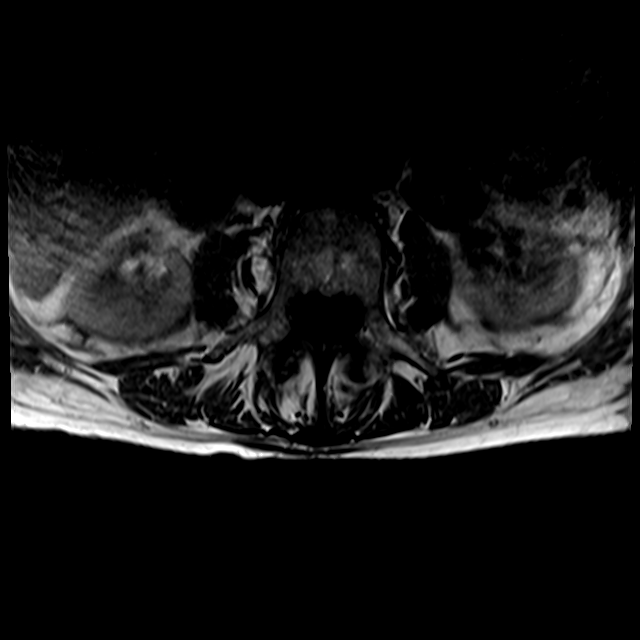
[im 20/23]
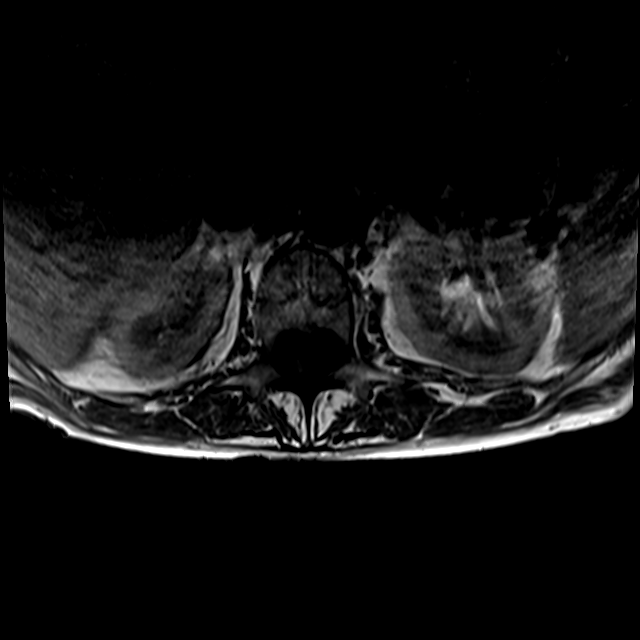

[26 of 48 positions shown; findings below may reference images not displayed]

FINDINGS: A portion of the cervical spine was included within the field of
view on the sagittal images again demonstrating extensive infectious
findings including prominent prevertebral phlegmon extending from C2
to T1 (series 21, image 8). Ventral epidural phlegmonous changes are
also noted from the C2 level extending down to approximately T1-T2
(series 21, image 9). Fluid signal within the C5-6 and C6-7 discs
with marrow edema in the C5, C6, and C7 vertebral bodies compatible
with discitis-osteomyelitis.

MRI THORACIC SPINE FINDINGS

Alignment: 2-3 mm grade 1 anterolisthesis T3 on T4. Normal thoracic
kyphosis.

Vertebrae: The thoracic vertebral body heights are maintained
without evidence of fracture. There are a few scattered intraosseous
hemangiomas, notably within the T2 and T7 vertebral bodies. No
evidence of discitis within the thoracic spine. No evidence of
osteomyelitis within the thoracic spine. No suspicious marrow
replacing lesion.

Cord:  Normal signal and morphology.

Paraspinal and other soft tissues: Prevertebral phlegmon and ventral
epidural phlegmon again noted extending from the cervical region
down to the T1-2 level, better characterized on small field-of-view
cervical spine MRI [DATE].

Disc levels:

No significant disc protrusion. No foraminal or canal stenosis is
seen within the thoracic spine.

MRI LUMBAR SPINE FINDINGS

Segmentation:  Standard.

Alignment:  Trace retrolisthesis L3 on L4 and L4 on L5.

Vertebrae: No fracture, evidence of discitis, or bone lesion.
Incidental intraosseous hemangioma within the L1 vertebral body.
Multilevel discogenic endplate marrow changes.

Conus medullaris and cauda equina: Conus extends to the L1-2 level.
Conus and cauda equina appear normal.

Paraspinal and other soft tissues: Negative.

Disc levels:

T12-L1: No significant disc protrusion, foraminal stenosis, or canal
stenosis.

L1-L2: Mild diffuse disc bulge. No foraminal or canal stenosis.

L2-L3: Mild diffuse disc bulge. No foraminal or canal stenosis.

L3-L4: Diffuse disc bulge with bilateral facet arthropathy and
ligamentum flavum buckling resulting in mild canal stenosis and mild
right foraminal stenosis.

There is a lobulated T1 isointense, T2 slightly hyperintense
structure within the ventral epidural space posterior to the L4
reflect a large disc herniation, possibly arising from the L3-4
disc. This causes mild canal stenosis at the L4 level. Material
extends to the L4-5 subarticular recess on the left (series 4, image
17).

L4-L5: Severe left subarticular recess stenosis from extension of
probable disc material. Bilateral facet arthropathy contribute to
mild left foraminal stenosis and mild canal stenosis. There is a
posterior facet synovial cyst on the left at L4-5 (series 2, image
12).

L5-S1: Mild disc bulge and bilateral facet arthrosis result in mild
left foraminal stenosis. No canal stenosis.
IMPRESSION: MRI Thoracic Spine:

1. No evidence for thoracic discitis-osteomyelitis.
2. Cervical discitis-osteomyelitis of C5-6 and C6-7 with extensive
prevertebral phlegmon and ventral epidural phlegmon extending from
C2 to T1-2 level, better characterized on dedicated cervical spine
MRI. No appreciable interval change from prior.
3. No significant disc protrusion, foraminal stenosis, or canal
stenosis.

MRI Lumbar Spine:

1. No evidence for lumbar discitis-osteomyelitis.
2. Mild multilevel spondylosis with mild canal stenosis at L3-4 and
L4-5.
3. Lobulated T1 isointense structure within the ventral epidural
space posterior to the L4 vertebral body which may reflect a large
disc herniation, possibly arising from the L3-4 disc. This causes
mild canal stenosis at L4 level and severe left L4-5 subarticular
recess stenosis.

## 2020-02-22 MED ORDER — CEFAZOLIN SODIUM-DEXTROSE 2-4 GM/100ML-% IV SOLN
2.0000 g | Freq: Three times a day (TID) | INTRAVENOUS | Status: DC
  Administered 2020-02-22 – 2020-02-25 (×11): 2 g via INTRAVENOUS
  Filled 2020-02-22 (×13): qty 100

## 2020-02-22 MED ORDER — POTASSIUM CHLORIDE CRYS ER 20 MEQ PO TBCR
30.0000 meq | EXTENDED_RELEASE_TABLET | Freq: Once | ORAL | Status: AC
  Administered 2020-02-22: 30 meq via ORAL
  Filled 2020-02-22: qty 1

## 2020-02-22 MED ORDER — BUDESONIDE 0.5 MG/2ML IN SUSP
1.0000 mg | Freq: Every evening | RESPIRATORY_TRACT | Status: DC | PRN
  Filled 2020-02-22: qty 4

## 2020-02-22 MED ORDER — LORAZEPAM 2 MG/ML IJ SOLN
0.5000 mg | INTRAMUSCULAR | Status: DC | PRN
  Administered 2020-02-22: 0.5 mg via INTRAVENOUS
  Filled 2020-02-22: qty 1

## 2020-02-22 NOTE — Consult Note (Signed)
[facility]       Reason for Consult: MSSA bacteremia with cervical spine osteomyelitis and soft tissue abscess    Referring Physician: Dr. Cruzita Lederer  Principal Problem:   Sepsis [facility]) Active Problems:   Depression   Anemia   Abscess in epidural space of cervical spine   Eosinophilic esophagitis   . enoxaparin (LOVENOX) injection  40 mg Subcutaneous Q24H  . ferrous sulfate  325 mg Oral Once per day on Mon Wed Fri  . gabapentin  100 mg Oral TID  . pantoprazole  40 mg Oral BID AC  . traZODone  100 mg Oral QHS  . traZODone  50 mg Oral Daily  . vitamin B-12  1,250 mcg Oral Daily    Recommendations: Cefazolin, will need likely 8 weeks MRI hips and S/L spine Repeat blood cultures TTE  Assessment: She has MSSA bacteremia with an extensive infection in her cervical area with concern for further spread with lower extremity weakness and pain.  She will need a prolonged course of antibiotics and ideally with source control.    Antibiotics: cefazolin  HPI: Megan Cox is a 69 y.o. female with history of eosinophilic esophagitis, depression who has had neck pain since December and followed by Dr. Maxie Better with persistent pain and then developed fever to 102 and chills this week.  MRI done and extensive cervical infection. WBC of 20.2.  Fever to 102 here.  Has had lower extremity weakness as well.  No arm numbness or tingling but weakness.      Review of Systems:  Constitutional: positive for fevers and chills Gastrointestinal: negative for nausea and diarrhea Integument/breast: negative for rash All other systems reviewed and are negative    Past Medical History:  Diagnosis Date  . Depression     Social History   Tobacco Use  . Smoking status: Never Smoker  . Smokeless tobacco: Never Used  Substance Use Topics  . Alcohol use: No  . Drug use: Never    Family History  Problem Relation Age of Onset  . Breast cancer Mother 3    Allergies   Allergen Reactions  . Contrast Media [Iodinated Diagnostic Agents] Anaphylaxis, Shortness Of Breath and Swelling    PATIENT STATES THAT HER BROTHER HAD ANAPHYLAXIS WITH IV CONTRAST AND WANTS TO SHARE THIS FAMILY HISTORY. Brother died from reaction    Physical Exam: Constitutional: in no apparent distress  Vitals:   02/21/20 2326 02/22/20 0846  BP: 119/64 129/62  Pulse: 99   Resp:  20  Temp: 99 F (37.2 C) 99.6 F (37.6 C)  SpO2: 96% 100%   EYES: anicteric Cardiovascular: Cor RRR Respiratory: clear; GI: soft Musculoskeletal: no pedal edema noted Skin: negatives: no rash   Lab Results  Component Value Date   WBC 20.2 (H) 02/22/2020   HGB 9.5 (L) 02/22/2020   HCT 29.8 (L) 02/22/2020   MCV 92.0 02/22/2020   PLT 273 02/22/2020    Lab Results  Component Value Date   CREATININE 0.97 02/22/2020   BUN 13 02/22/2020   NA 136 02/22/2020   K 3.4 (L) 02/22/2020   CL 102 02/22/2020   CO2 21 (L) 02/22/2020    Lab Results  Component Value Date   ALT 34 02/21/2020   AST 22 02/21/2020   ALKPHOS 103 02/21/2020     Microbiology: Recent Results (from the past 240 hour(s))  Culture, blood (routine x 2)     Status: None (Preliminary result)   Collection  Time: 02/21/20 10:58 AM   Specimen: BLOOD  Result Value Ref Range Status   Specimen Description BLOOD BLOOD RIGHT FOREARM  Final   Special Requests   Final    BOTTLES DRAWN AEROBIC AND ANAEROBIC Blood Culture adequate volume   Culture  Setup Time   Final    IN BOTH AEROBIC AND ANAEROBIC BOTTLES GRAM POSITIVE COCCI IN CLUSTERS CRITICAL RESULT CALLED TO, READ BACK BY AND VERIFIED WITH: K AMEND PHARMD 02/22/20 0420 JDW    Culture   Final    NO GROWTH < 24 HOURS Performed at [facility], [place] [address], [place], [place] [address]    Report Status PENDING  Incomplete  Blood Culture ID Panel (Reflexed)     Status: Abnormal   Collection Time: 02/21/20 10:58 AM  Result Value Ref Range Status   Enterococcus species NOT  DETECTED NOT DETECTED Final   Listeria monocytogenes NOT DETECTED NOT DETECTED Final   Staphylococcus species DETECTED (A) NOT DETECTED Final    Comment: CRITICAL RESULT CALLED TO, READ BACK BY AND VERIFIED WITH: K AMEND PHARMD 02/22/20 0420 JDW    Staphylococcus aureus (BCID) DETECTED (A) NOT DETECTED Final    Comment: Methicillin (oxacillin) susceptible Staphylococcus aureus (MSSA). Preferred therapy is anti staphylococcal beta lactam antibiotic (Cefazolin or Nafcillin), unless clinically contraindicated. CRITICAL RESULT CALLED TO, READ BACK BY AND VERIFIED WITH: K AMEND PHARMD 02/22/20 0420 JDW    Methicillin resistance NOT DETECTED NOT DETECTED Final   Streptococcus species NOT DETECTED NOT DETECTED Final   Streptococcus agalactiae NOT DETECTED NOT DETECTED Final   Streptococcus pneumoniae NOT DETECTED NOT DETECTED Final   Streptococcus pyogenes NOT DETECTED NOT DETECTED Final   Acinetobacter baumannii NOT DETECTED NOT DETECTED Final   Enterobacteriaceae species NOT DETECTED NOT DETECTED Final   Enterobacter cloacae complex NOT DETECTED NOT DETECTED Final   Escherichia coli NOT DETECTED NOT DETECTED Final   Klebsiella oxytoca NOT DETECTED NOT DETECTED Final   Klebsiella pneumoniae NOT DETECTED NOT DETECTED Final   Proteus species NOT DETECTED NOT DETECTED Final   Serratia marcescens NOT DETECTED NOT DETECTED Final   Haemophilus influenzae NOT DETECTED NOT DETECTED Final   Neisseria meningitidis NOT DETECTED NOT DETECTED Final   Pseudomonas aeruginosa NOT DETECTED NOT DETECTED Final   Candida albicans NOT DETECTED NOT DETECTED Final   Candida glabrata NOT DETECTED NOT DETECTED Final   Candida krusei NOT DETECTED NOT DETECTED Final   Candida parapsilosis NOT DETECTED NOT DETECTED Final   Candida tropicalis NOT DETECTED NOT DETECTED Final    Comment: Performed at [facility], [place] [address] [place], [place] [address]  Culture, blood (routine x 2)     Status: None (Preliminary  result)   Collection Time: 02/21/20 11:12 AM   Specimen: BLOOD LEFT ARM  Result Value Ref Range Status   Specimen Description BLOOD LEFT ARM  Final   Special Requests   Final    BOTTLES DRAWN AEROBIC AND ANAEROBIC Blood Culture results may not be optimal due to an inadequate volume of blood received in culture bottles   Culture  Setup Time   Final    IN BOTH AEROBIC AND ANAEROBIC BOTTLES GRAM POSITIVE COCCI IN CLUSTERS CRITICAL VALUE NOTED.  VALUE IS CONSISTENT WITH PREVIOUSLY REPORTED AND CALLED VALUE.    Culture   Final    NO GROWTH < 24 HOURS Performed at [facility], [place] [address], [place], [place] [address]    Report Status PENDING  Incomplete  Urine  culture     Status: None (Preliminary result)   Collection Time: 02/21/20 11:25 AM   Specimen: Urine, Random  Result Value Ref Range Status   Specimen Description URINE, RANDOM  Final   Special Requests NONE  Final   Culture   Final    CULTURE REINCUBATED FOR BETTER GROWTH Performed at [facility], [place] [address], [place], [place] [address]    Report Status PENDING  Incomplete  Respiratory Panel by RT PCR (Flu A&B, Covid) - Nasopharyngeal Swab     Status: None   Collection Time: 02/21/20  2:45 PM   Specimen: Nasopharyngeal Swab  Result Value Ref Range Status   SARS Coronavirus 2 by RT PCR NEGATIVE NEGATIVE Final    Comment: (NOTE) SARS-CoV-2 target nucleic acids are NOT DETECTED. The SARS-CoV-2 RNA is generally detectable in upper respiratoy specimens during the acute phase of infection. The lowest concentration of SARS-CoV-2 viral copies this assay can detect is 131 copies/mL. A negative result does not preclude SARS-Cov-2 infection and should not be used as the sole basis for treatment or other patient management decisions. A negative result may occur with  improper specimen collection/handling, submission of specimen other than nasopharyngeal swab, presence of viral mutation(s) within the areas targeted by  this assay, and inadequate number of viral copies (<131 copies/mL). A negative result must be combined with clinical observations, patient history, and epidemiological information. The expected result is Negative. Fact Sheet for Patients:  PinkCheek.be Fact Sheet for Healthcare Providers:  GravelBags.it This test is not yet ap proved or cleared by the [place] FDA and  has been authorized for detection and/or diagnosis of SARS-CoV-2 by FDA under an Emergency Use Authorization (EUA). This EUA will remain  in effect (meaning this test can be used) for the duration of the COVID-19 declaration under Section 564(b)(1) of the Act, 21 U.S.C. section 360bbb-3(b)(1), unless the authorization is terminated or revoked sooner.    Influenza A by PCR NEGATIVE NEGATIVE Final   Influenza B by PCR NEGATIVE NEGATIVE Final    Comment: (NOTE) The Xpert Xpress SARS-CoV-2/FLU/RSV assay is intended as an aid in  the diagnosis of influenza from Nasopharyngeal swab specimens and  should not be used as a sole basis for treatment. Nasal washings and  aspirates are unacceptable for Xpert Xpress SARS-CoV-2/FLU/RSV  testing. Fact Sheet for Patients: PinkCheek.be Fact Sheet for Healthcare Providers: GravelBags.it This test is not yet approved or cleared by the [place] FDA and  has been authorized for detection and/or diagnosis of SARS-CoV-2 by  FDA under an Emergency Use Authorization (EUA). This EUA will remain  in effect (meaning this test can be used) for the duration of the  Covid-19 declaration under Section 564(b)(1) of the Act, 21  U.S.C. section 360bbb-3(b)(1), unless the authorization is  terminated or revoked. Performed at [facility], [place] [address], [place], [place] [address]     Tiney Zipper [place], [place] for Infectious Disease [facility] [email or web address] 02/22/2020, 10:42 AM

## 2020-02-22 NOTE — Progress Notes (Addendum)
Occupational Therapy Evaluation Patient Details Name: Megan Cox MRN: [id number] DOB: 09/12/1951 Today's Date: 02/22/2020    History of Present Illness Pt is a 69 y.o. female with medical history significant of depression/anxiety, eosinophilic esophagitis, arthritis presents to emergency department due to fever, chills, neck, bilateral shoulder and bilateral hip pain.    Clinical Impression   Patient is pleasant and happy to work with therapy.  She is independent at home, lives alone, and is very active.  Today patient on room air and having slight shortness of breath with prolonged activity.  She was able to stand and complete full body wash up and sink and move around room with supervision.  She required one seated rest break during bath.  Her biggest complaints are tiring quicker than normal and slight neck pain.  She expressed some concern about showering at home and therapist recommended 3-in-1 use in shower as a chair.  Will continue to follow with OT acutely to address the deficits listed below.        Follow Up Recommendations  No OT follow up    Equipment Recommendations  3 in 1 bedside commode    Recommendations for Other Services       Precautions / Restrictions Precautions Precautions: Fall Restrictions Weight Bearing Restrictions: No      Mobility Bed Mobility               General bed mobility comments: Patient up in chair  Transfers Overall transfer level: Needs assistance Equipment used: None Transfers: Sit to/from Stand;Stand Pivot Transfers Sit to Stand: Supervision Stand pivot transfers: Supervision       General transfer comment: Supervision for line management    Balance Overall balance assessment: Needs assistance Sitting-balance support: Feet supported Sitting balance-Leahy Scale: Normal Sitting balance - Comments: able to sit EOB without concern   Standing balance support: No upper extremity supported Standing balance-Leahy Scale:  Fair                            ADL either performed or assessed with clinical judgement   ADL Overall ADL's : Needs assistance/impaired Eating/Feeding: Independent;Sitting   Grooming: Wash/dry hands;Wash/dry face;Supervision/safety;Standing   Upper Body Bathing: Supervision/ safety;Standing   Lower Body Bathing: Supervison/ safety;Sit to/from stand   Upper Body Dressing : Supervision/safety;Standing   Lower Body Dressing: Supervision/safety;Sit to/from stand   Toilet Transfer: Supervision/safety;Ambulation           Functional mobility during ADLs: Supervision/safety General ADL Comments: Patient moves well and knows her limitations     Vision         Perception     Praxis      Pertinent Vitals/Pain Pain Assessment: Faces Faces Pain Scale: Hurts a little bit Pain Location: neck Pain Descriptors / Indicators: Discomfort Pain Intervention(s): Limited activity within patient's tolerance;Monitored during session;Repositioned     Hand Dominance Right   Extremity/Trunk Assessment Upper Extremity Assessment Upper Extremity Assessment: Overall WFL for tasks assessed          Communication Communication Communication: No difficulties   Cognition Arousal/Alertness: Awake/alert Behavior During Therapy: WFL for tasks assessed/performed Overall Cognitive Status: Within Functional Limits for tasks assessed                                 General Comments: Pt is pleasant and highly motivated.   General Comments  Exercises Exercises: Other exercises Other Exercises Other Exercises: pursed lip breathing Other Exercises: chin tucks education and practice- 3-5 second holds x 10 seated in chair Other Exercises: cervical roll education for seated and sleeping positioning   Shoulder Instructions      Home Living Family/patient expects to be discharged to:: Private residence Living Arrangements: Alone Available Help at Discharge:  Neighbor;Family;Available PRN/intermittently Type of Home: House Home Access: Level entry     Home Layout: One level     Bathroom Shower/Tub: Walk-in shower         Home Equipment: Grab bars - toilet;Grab bars - tub/shower   Additional Comments: Pt reports her town home is handicap accessible. Pt reports sleeping in a recliner for the past 2 weeks due to her neck pain and trying to find the right position, however she wants to get back to bed comfortably.       Prior Functioning/Environment Level of Independence: Independent        Comments: Pt is independent without AD, very active, rides home bike 25-30 miles a day, and pt drives.        OT Problem List: Decreased strength;Decreased activity tolerance;Cardiopulmonary status limiting activity;Pain      OT Treatment/Interventions: Self-care/ADL training;Therapeutic exercise;Energy conservation;Therapeutic activities;Patient/family education    OT Goals(Current goals can be found in the care plan section) Acute Rehab OT Goals Patient Stated Goal: to shower at home OT Goal Formulation: With patient Time For Goal Achievement: 03/07/20 Potential to Achieve Goals: Good  OT Frequency: Min 3X/week   Barriers to D/C:            Co-evaluation              AM-PAC OT "6 Clicks" Daily Activity     Outcome Measure Help from another person eating meals?: None Help from another person taking care of personal grooming?: A Little Help from another person toileting, which includes using toliet, bedpan, or urinal?: A Little Help from another person bathing (including washing, rinsing, drying)?: A Little Help from another person to put on and taking off regular upper body clothing?: A Little Help from another person to put on and taking off regular lower body clothing?: A Little 6 Click Score: 19   End of Session Nurse Communication: Mobility status  Activity Tolerance: Patient tolerated treatment well Patient left: in  chair;with call bell/phone within reach  OT Visit Diagnosis: Muscle weakness (generalized) (M62.81);Pain Pain - part of body: (Neck)                Time: 1031-1110 OT Time Calculation (min): 39 min Charges:  OT General Charges $OT Visit: 1 Visit OT Evaluation $OT Eval Moderate Complexity: 1 Mod OT Treatments $Self Care/Home Management : 23-37 mins  August Luz, OTR/L   Phylliss Bob 02/22/2020, 12:36 PM

## 2020-02-22 NOTE — TOC Initial Note (Signed)
Transition of Care [facility]) - Initial/Assessment Note    Patient Details  Name: Megan Cox MRN: [id number] Date of Birth: 01/13/1951  Transition of Care [facility]) CM/SW Contact:    Carles Collet, RN Phone Number: 02/22/2020, 3:56 PM  Clinical Narrative:         Spoke to patient at the bedside. She lives at home alone. She is a former Marketing executive, now retired. She has a high level of medical literacy and independence prior to admission. We discussed dispo plan with IV Abx. We both feel she would be able to manage infusions at home herself with an extender on her PICC line so could reach it. I have notified Carolynn Sayers with [facility]. We discussed [place] providers and ratings, and she would like [place]. Referral given to [facility]   Patient will need cultures to read out 48 hours and PICC line placed prior to DC.  Patient states that she does not anticipate DC until Tuesday.             Expected Discharge Plan: [place] Barriers to Discharge: Continued Medical Work up   Patient Goals and CMS Choice Patient states their goals for this hospitalization and ongoing recovery are:: to go home CMS Medicare.gov Compare Post Acute Care list provided to:: Patient Choice offered to / list presented to : Patient  Expected Discharge Plan and Services Expected Discharge Plan: [place]   Discharge Planning Services: CM Consult                               HH Arranged: RN, PT, OT [facility] Agency: [place] Date [place]: 02/22/20 Time HH Agency Contacted: 65 Representative spoke with at [place]: [place] Arrangements/Services   Lives with:: Self                   Activities of Daily Living Home Assistive Devices/Equipment: None ADL Screening (condition at time of admission) Patient's cognitive ability adequate to safely complete daily activities?: Yes Is the patient deaf or have difficulty  hearing?: No Does the patient have difficulty seeing, even when wearing glasses/contacts?: No Does the patient have difficulty concentrating, remembering, or making decisions?: No Patient able to express need for assistance with ADLs?: No Does the patient have difficulty dressing or bathing?: No Independently performs ADLs?: Yes (appropriate for developmental age) Does the patient have difficulty walking or climbing stairs?: No Weakness of Legs: None Weakness of Arms/Hands: None  Permission Sought/Granted                  Emotional Assessment              Admission diagnosis:  Abscess in epidural space of cervical spine [G06.1] Patient Active Problem List   Diagnosis Date Noted  . Cellulitis and abscess of neck 02/21/2020  . Eosinophilic esophagitis [id number]  . Depression   . Sepsis ([place])   . Anemia   . Abscess in epidural space of cervical spine   . Dysphagia 09/22/2013  . Foreign body in esophagus 09/22/2013   PCP:  Lavone Orn, MD Pharmacy:   [facility] [address] [place] [address] Phone: [phone] Fax: [place] [address] [place] [address] Phone: [phone] Fax: [phone]  Social Determinants of Health (SDOH) Interventions    Readmission Risk Interventions No flowsheet data found.

## 2020-02-22 NOTE — Progress Notes (Signed)
PHARMACY - PHYSICIAN COMMUNICATION CRITICAL VALUE ALERT - BLOOD CULTURE IDENTIFICATION (BCID)  Megan Cox is an 69 y.o. female who presented to [facility] on 02/21/2020 with a chief complaint of sepsis d/t prevertebral abscess/cellulitis/osteomyelitis  Assessment: Pt growing MSSA in 4/4 blood cultures  Name of physician (or Provider) Contacted: Dr. Kennon Holter  Current antibiotics: Zosyn   Changes to prescribed antibiotics recommended:  Change antibiotics to Ancef 2gm IV q8h. ID will also receive automatic consult.  Results for orders placed or performed during the hospital encounter of 02/21/20  Blood Culture ID Panel (Reflexed) (Collected: 02/21/2020 10:58 AM)  Result Value Ref Range   Enterococcus species NOT DETECTED NOT DETECTED   Listeria monocytogenes NOT DETECTED NOT DETECTED   Staphylococcus species DETECTED (A) NOT DETECTED   Staphylococcus aureus (BCID) DETECTED (A) NOT DETECTED   Methicillin resistance NOT DETECTED NOT DETECTED   Streptococcus species NOT DETECTED NOT DETECTED   Streptococcus agalactiae NOT DETECTED NOT DETECTED   Streptococcus pneumoniae NOT DETECTED NOT DETECTED   Streptococcus pyogenes NOT DETECTED NOT DETECTED   Acinetobacter baumannii NOT DETECTED NOT DETECTED   Enterobacteriaceae species NOT DETECTED NOT DETECTED   Enterobacter cloacae complex NOT DETECTED NOT DETECTED   Escherichia coli NOT DETECTED NOT DETECTED   Klebsiella oxytoca NOT DETECTED NOT DETECTED   Klebsiella pneumoniae NOT DETECTED NOT DETECTED   Proteus species NOT DETECTED NOT DETECTED   Serratia marcescens NOT DETECTED NOT DETECTED   Haemophilus influenzae NOT DETECTED NOT DETECTED   Neisseria meningitidis NOT DETECTED NOT DETECTED   Pseudomonas aeruginosa NOT DETECTED NOT DETECTED   Candida albicans NOT DETECTED NOT DETECTED   Candida glabrata NOT DETECTED NOT DETECTED   Candida krusei NOT DETECTED NOT DETECTED   Candida parapsilosis NOT DETECTED NOT DETECTED   Candida  tropicalis NOT DETECTED NOT DETECTED    Sherlon Handing, PharmD, BCPS Please see amion for complete clinical pharmacist phone list 02/22/2020  4:32 AM

## 2020-02-22 NOTE — Evaluation (Signed)
Physical Therapy Evaluation Patient Details Name: Megan Cox MRN: [id number] DOB: 08-29-1951 Today's Date: 02/22/2020   History of Present Illness  Pt is a 69 y.o. female with medical history significant of depression/anxiety, eosinophilic esophagitis, arthritis presents to emergency department due to fever, chills, neck, bilateral shoulder and bilateral hip pain.     Clinical Impression  Pt is pleasant and very cooperative/ motivated during therapy. Pt lives alone, is independent without AD, and rides her stationary bike for 25-30 miles a day at prior level. SpO2 is 98% resting. Pt is able to to transfer without AD with supervision. Pt presents with generalized LE weakness, neck pain (currently being seen for ortho), decreased endurance, and functional mobility. Pt ambulates 31' with min guard and demonstrates one instance of LOB when she fatigues in which requires min A. Pt's SpO2 drops to 90% briefly when ambulating however quickly rises with pursed lip breathing. Home health PT is recommended at d/c. PT will continue to follow pt acutely.     Follow Up Recommendations Home health PT    Equipment Recommendations  Other (comment)       Precautions / Restrictions Precautions Precautions: Fall Restrictions Weight Bearing Restrictions: No      Mobility  Bed Mobility               General bed mobility comments: Pt sitting EOB on entry. Independent with bed mobility  Transfers Overall transfer level: Needs assistance Equipment used: None Transfers: Sit to/from [facility] Sit to Stand: Supervision Stand pivot transfers: Supervision       General transfer comment: Supervision for line management  Ambulation/Gait Ambulation/Gait assistance: Min guard;Min assist Gait Distance (Feet): 80 Feet Assistive device: None Gait Pattern/deviations: Step-through pattern;Decreased step length - right;Decreased step length - left;Narrow base of support Gait  velocity: decreased Gait velocity interpretation: 1.31 - 2.62 ft/sec, indicative of limited community ambulator General Gait Details: Pt initially demonstrates good balance and requires min guard mainly for line management,  pt becomes slightly SOB and experiences 1 LOB in which min A is given for steadying. Pt says "she tends to push herself" however pt educated on energy conservation techniques and demonstrates good understanding.                   Balance Overall balance assessment: Needs assistance Sitting-balance support: Feet supported Sitting balance-Leahy Scale: Normal Sitting balance - Comments: able to sit EOB without concern   Standing balance support: No upper extremity supported Standing balance-Leahy Scale: Fair Standing balance comment: Fair standing balance without UE support. Pt requires min guard for balance at time when she is fatigued.                             Pertinent Vitals/Pain Pain Assessment: Faces Faces Pain Scale: Hurts a little bit Pain Location: low back  Pain Descriptors / Indicators: Discomfort Pain Intervention(s): Limited activity within patient's tolerance;Monitored during session;Repositioned    Home Living Family/patient expects to be discharged to:: Private residence Living Arrangements: Alone Available Help at Discharge: Neighbor;Family;Available PRN/intermittently Type of Home: Other(Comment)(Town home) Home Access: Level entry     Home Layout: One level Home Equipment: Grab bars - toilet;Grab bars - tub/shower Additional Comments: Pt reports her town home is handicap accessible. Pt reports sleeping in a recliner for the past 2 weeks due to her neck pain and trying to find the right position, however she wants to get back to bed comfortably.  Prior Function Level of Independence: Independent         Comments: Pt is independent without AD, very active, rides home bike 25-30 miles a day, and pt drives.      Hand Dominance   Dominant Hand: Right    Extremity/Trunk Assessment   Upper Extremity Assessment Upper Extremity Assessment: Overall WFL for tasks assessed    Lower Extremity Assessment Lower Extremity Assessment: Generalized weakness;RLE deficits/detail;LLE deficits/detail RLE Deficits / Details: 3+/5 MMT LLE Deficits / Details: 3+/5 MMT       Communication   Communication: No difficulties  Cognition Arousal/Alertness: Awake/alert Behavior During Therapy: WFL for tasks assessed/performed Overall Cognitive Status: Within Functional Limits for tasks assessed                                 General Comments: Pt is pleasant and highly motivated.      General Comments General comments (skin integrity, edema, etc.): Pt is resting on room air. SpO2 is 98%. Pt becomes slightly SOB and fatigued upon transfers and ambulation. SpO2 briefly drops to 90% during ambulation however quickly increases with pursed lip breathing.    Exercises Other Exercises Other Exercises: pursed lip breathing Other Exercises: chin tucks education and practice- 3-5 second holds x 10 seated in chair Other Exercises: cervical roll education for seated and sleeping positioning      PT Assessment Patient needs continued PT services  PT Problem List Decreased strength;Decreased activity tolerance;Decreased balance;Decreased mobility;Cardiopulmonary status limiting activity;Pain       PT Treatment Interventions Gait training;Stair training;Functional mobility training;Therapeutic activities;Therapeutic exercise;Balance training;Neuromuscular re-education;Patient/family education    PT Goals (Current goals can be found in the Care Plan section)  Acute Rehab PT Goals Patient Stated Goal: to ride my stationary bike again PT Goal Formulation: With patient Time For Goal Achievement: 03/07/20 Potential to Achieve Goals: Good    Frequency Min 3X/week    AM-PAC PT "6 Clicks" Mobility   Outcome Measure Help needed turning from your back to your side while in a flat bed without using bedrails?: None Help needed moving from lying on your back to sitting on the side of a flat bed without using bedrails?: None Help needed moving to and from a bed to a chair (including a wheelchair)?: None Help needed standing up from a chair using your arms (e.g., wheelchair or bedside chair)?: None Help needed to walk in hospital room?: A Little Help needed climbing 3-5 steps with a railing? : A Lot 6 Click Score: 21    End of Session Equipment Utilized During Treatment: Gait belt Activity Tolerance: Patient tolerated treatment well;Patient limited by fatigue Patient left: in chair;with call bell/phone within reach Nurse Communication: Mobility status PT Visit Diagnosis: Unsteadiness on feet (R26.81);Muscle weakness (generalized) (M62.81);Pain Pain - part of body: (neck, low back)    Time: [id number] PT Time Calculation (min) (ACUTE ONLY): 50 min   Charges:   PT Evaluation $PT Eval Moderate Complexity: 1 Mod PT Treatments $Therapeutic Exercise: 8-22 mins $Therapeutic Activity: 8-22 mins        Jodelle Green, PT, DPT Acute Rehabilitation Services Office [phone]   Jodelle Green 02/22/2020, 11:30 AM

## 2020-02-22 NOTE — Progress Notes (Signed)
PROGRESS NOTE  ELEESA VANAKEN [id number] DOB: 11-10-1951 DOA: 02/21/2020 PCP: Lavone Orn, MD   LOS: 1 day   Brief Narrative / Interim history: 69 year old pleasant female with history of depression/anxiety, eosinophilic esophagitis, arthritis, came into the hospital due to fever, chills, neck as well as bilateral shoulder pain for the past couple of days.  She has been having progressive symptoms that started about 2 weeks ago.  She is also been complaining of intermittent right leg weakness.  She was evaluated by orthopedics as an outpatient and initially was started on supportive treatment which did not help her, including gabapentin, Robaxin as well as prednisone.  Given the fever and the neck pain she was directed to the emergency room.  An MRI showed an extensive soft tissue infection within the neck, ventrolateral prevertebral cellulitis/phlegmon. ENT consulted  Subjective / 24h Interval events: Tells me she has not had much sleep overnight but 2 hours due to having to position her neck certain way.  Complains of right leg weakness this morning upon trying to get out of bed, now improved.  Assessment & Plan: Principal Problem Sepsis due to prevertebral abscess/phlegmon as well as MSSA bacteremia -Patient initially on broad-spectrum antibiotics on admission, transition to Ancef given MSSA bacteremia -ENT to follow, ID also consulted -Still has persistent leukocytosis, fever curve improving.  Not as tachycardic this morning.  Continue to closely monitor -Obtain a 2D echo to rule out vegetations  Active Problems Eosinophilic esophagitis -Continue PPI  Depression -Continue trazodone, Xanax as needed  Hypokalemia -replete and recheck tomorrow morning  Iron deficiency anemia -Hemoglobin stable, continue iron supplements   Scheduled Meds: . enoxaparin (LOVENOX) injection  40 mg Subcutaneous Q24H  . ferrous sulfate  325 mg Oral Once per day on Mon Wed Fri  . gabapentin  100  mg Oral TID  . pantoprazole  40 mg Oral BID AC  . traZODone  100 mg Oral QHS  . traZODone  50 mg Oral Daily  . vitamin B-12  1,250 mcg Oral Daily   Continuous Infusions: . sodium chloride 100 mL/hr at 02/22/20 0654  .  ceFAZolin (ANCEF) IV 2 g (02/22/20 0541)   PRN Meds:.acetaminophen **OR** acetaminophen, ALPRAZolam, budesonide, HYDROmorphone (DILAUDID) injection, methocarbamol, ondansetron **OR** ondansetron (ZOFRAN) IV  DVT prophylaxis: Lovenox Code Status: Full code Family Communication: d/w patient  Patient admitted from: home  Anticipated d/c place: home Barriers to d/c: MSSA bacteremia requiring IV antibiotics further investigations with 2D echo, awaiting surveillance cultures, ID input, possibly needing surgery to her phlegmon  Consultants:  ENT ID  Procedures:  2D echo: pending  Microbiology  Blood cultures 3/19-MSSA 4 out of 4 bottles  Antimicrobials: Cefazolin 3/20 >>   Objective: Vitals:   02/21/20 1856 02/21/20 2326 02/22/20 0300 02/22/20 0846  BP: (!) 145/93 119/64  129/62  Pulse: (!) 117 99    Resp: 18   20  Temp: 99.1 F (37.3 C) 99 F (37.2 C)  99.6 F (37.6 C)  TempSrc:    Oral  SpO2: 94% 96%  100%  Weight:   51.3 kg   Height:       No intake or output data in the 24 hours ending 02/22/20 0924 Filed Weights   02/21/20 0952 02/22/20 0300  Weight: 48.1 kg 51.3 kg    Examination:  Constitutional: NAD Eyes: no scleral icterus ENMT: Mucous membranes are moist.  Neck: normal, supple Respiratory: clear to auscultation bilaterally, no wheezing, no crackles. Normal respiratory effort. No accessory muscle use.  Cardiovascular:  Regular rate and rhythm, no murmurs / rubs / gallops. No LE edema. Good peripheral pulses Abdomen: non distended, no tenderness. Bowel sounds positive.  Musculoskeletal: no clubbing / cyanosis.  Skin: no rashes Neurologic: Grossly nonfocal   Data Reviewed: I have independently reviewed following labs and imaging  studies   CBC: Recent Labs  Lab 02/21/20 1058 02/22/20 0216  WBC 20.8* 20.2*  NEUTROABS 17.8*  --   HGB 9.7* 9.5*  HCT 29.6* 29.8*  MCV 90.5 92.0  PLT 263 [id number]   Basic Metabolic Panel: Recent Labs  Lab 02/21/20 1058 02/22/20 0216  NA 135 136  K 3.7 3.4*  CL 101 102  CO2 22 21*  GLUCOSE 117* 103*  BUN 15 13  CREATININE 0.94 0.97  CALCIUM 8.1* 7.9*   Liver Function Tests: Recent Labs  Lab 02/21/20 1058  AST 22  ALT 34  ALKPHOS 103  BILITOT 0.7  PROT 6.1*  ALBUMIN 2.3*   Coagulation Profile: No results for input(s): INR, PROTIME in the last 168 hours. HbA1C: No results for input(s): HGBA1C in the last 72 hours. CBG: No results for input(s): GLUCAP in the last 168 hours.  Recent Results (from the past 240 hour(s))  Culture, blood (routine x 2)     Status: None (Preliminary result)   Collection Time: 02/21/20 10:58 AM   Specimen: BLOOD  Result Value Ref Range Status   Specimen Description BLOOD BLOOD RIGHT FOREARM  Final   Special Requests   Final    BOTTLES DRAWN AEROBIC AND ANAEROBIC Blood Culture adequate volume   Culture  Setup Time   Final    IN BOTH AEROBIC AND ANAEROBIC BOTTLES GRAM POSITIVE COCCI IN CLUSTERS CRITICAL RESULT CALLED TO, READ BACK BY AND VERIFIED WITH: K AMEND PHARMD 02/22/20 0420 JDW    Culture   Final    NO GROWTH < 24 HOURS Performed at [facility], [place] [address], [place], [place] [address]    Report Status PENDING  Incomplete  Blood Culture ID Panel (Reflexed)     Status: Abnormal   Collection Time: 02/21/20 10:58 AM  Result Value Ref Range Status   Enterococcus species NOT DETECTED NOT DETECTED Final   Listeria monocytogenes NOT DETECTED NOT DETECTED Final   Staphylococcus species DETECTED (A) NOT DETECTED Final    Comment: CRITICAL RESULT CALLED TO, READ BACK BY AND VERIFIED WITH: K AMEND PHARMD 02/22/20 0420 JDW    Staphylococcus aureus (BCID) DETECTED (A) NOT DETECTED Final    Comment: Methicillin (oxacillin)  susceptible Staphylococcus aureus (MSSA). Preferred therapy is anti staphylococcal beta lactam antibiotic (Cefazolin or Nafcillin), unless clinically contraindicated. CRITICAL RESULT CALLED TO, READ BACK BY AND VERIFIED WITH: K AMEND PHARMD 02/22/20 0420 JDW    Methicillin resistance NOT DETECTED NOT DETECTED Final   Streptococcus species NOT DETECTED NOT DETECTED Final   Streptococcus agalactiae NOT DETECTED NOT DETECTED Final   Streptococcus pneumoniae NOT DETECTED NOT DETECTED Final   Streptococcus pyogenes NOT DETECTED NOT DETECTED Final   Acinetobacter baumannii NOT DETECTED NOT DETECTED Final   Enterobacteriaceae species NOT DETECTED NOT DETECTED Final   Enterobacter cloacae complex NOT DETECTED NOT DETECTED Final   Escherichia coli NOT DETECTED NOT DETECTED Final   Klebsiella oxytoca NOT DETECTED NOT DETECTED Final   Klebsiella pneumoniae NOT DETECTED NOT DETECTED Final   Proteus species NOT DETECTED NOT DETECTED Final   Serratia marcescens NOT DETECTED NOT DETECTED Final   Haemophilus influenzae NOT DETECTED NOT DETECTED Final   Neisseria meningitidis NOT DETECTED  NOT DETECTED Final   Pseudomonas aeruginosa NOT DETECTED NOT DETECTED Final   Candida albicans NOT DETECTED NOT DETECTED Final   Candida glabrata NOT DETECTED NOT DETECTED Final   Candida krusei NOT DETECTED NOT DETECTED Final   Candida parapsilosis NOT DETECTED NOT DETECTED Final   Candida tropicalis NOT DETECTED NOT DETECTED Final    Comment: Performed at [facility], [place] [address] [place], [place] [address]  Culture, blood (routine x 2)     Status: None (Preliminary result)   Collection Time: 02/21/20 11:12 AM   Specimen: BLOOD LEFT ARM  Result Value Ref Range Status   Specimen Description BLOOD LEFT ARM  Final   Special Requests   Final    BOTTLES DRAWN AEROBIC AND ANAEROBIC Blood Culture results may not be optimal due to an inadequate volume of blood received in culture bottles   Culture  Setup Time    Final    IN BOTH AEROBIC AND ANAEROBIC BOTTLES GRAM POSITIVE COCCI IN CLUSTERS CRITICAL VALUE NOTED.  VALUE IS CONSISTENT WITH PREVIOUSLY REPORTED AND CALLED VALUE.    Culture   Final    NO GROWTH < 24 HOURS Performed at [facility], [place] [address] [place], [place] [address]    Report Status PENDING  Incomplete  Urine culture     Status: None (Preliminary result)   Collection Time: 02/21/20 11:25 AM   Specimen: Urine, Random  Result Value Ref Range Status   Specimen Description URINE, RANDOM  Final   Special Requests NONE  Final   Culture   Final    CULTURE REINCUBATED FOR BETTER GROWTH Performed at [facility], [place] [address] [place], [place] [address]    Report Status PENDING  Incomplete  Respiratory Panel by RT PCR (Flu A&B, Covid) - Nasopharyngeal Swab     Status: None   Collection Time: 02/21/20  2:45 PM   Specimen: Nasopharyngeal Swab  Result Value Ref Range Status   SARS Coronavirus 2 by RT PCR NEGATIVE NEGATIVE Final    Comment: (NOTE) SARS-CoV-2 target nucleic acids are NOT DETECTED. The SARS-CoV-2 RNA is generally detectable in upper respiratoy specimens during the acute phase of infection. The lowest concentration of SARS-CoV-2 viral copies this assay can detect is 131 copies/mL. A negative result does not preclude SARS-Cov-2 infection and should not be used as the sole basis for treatment or other patient management decisions. A negative result may occur with  improper specimen collection/handling, submission of specimen other than nasopharyngeal swab, presence of viral mutation(s) within the areas targeted by this assay, and inadequate number of viral copies (<131 copies/mL). A negative result must be combined with clinical observations, patient history, and epidemiological information. The expected result is Negative. Fact Sheet for Patients:  PinkCheek.be Fact Sheet for Healthcare Providers:    GravelBags.it This test is not yet ap proved or cleared by the [place] FDA and  has been authorized for detection and/or diagnosis of SARS-CoV-2 by FDA under an Emergency Use Authorization (EUA). This EUA will remain  in effect (meaning this test can be used) for the duration of the COVID-19 declaration under Section 564(b)(1) of the Act, 21 U.S.C. section 360bbb-3(b)(1), unless the authorization is terminated or revoked sooner.    Influenza A by PCR NEGATIVE NEGATIVE Final   Influenza B by PCR NEGATIVE NEGATIVE Final    Comment: (NOTE) The Xpert Xpress SARS-CoV-2/FLU/RSV assay is intended as an aid in  the diagnosis of influenza from Nasopharyngeal swab specimens  and  should not be used as a sole basis for treatment. Nasal washings and  aspirates are unacceptable for Xpert Xpress SARS-CoV-2/FLU/RSV  testing. Fact Sheet for Patients: PinkCheek.be Fact Sheet for Healthcare Providers: GravelBags.it This test is not yet approved or cleared by the [place] FDA and  has been authorized for detection and/or diagnosis of SARS-CoV-2 by  FDA under an Emergency Use Authorization (EUA). This EUA will remain  in effect (meaning this test can be used) for the duration of the  Covid-19 declaration under Section 564(b)(1) of the Act, 21  U.S.C. section 360bbb-3(b)(1), unless the authorization is  terminated or revoked. Performed at [facility], [place] [address], [place], [place] [address]      Radiology Studies: MR Cervical Spine W or Wo Contrast  Result Date: 02/21/2020 CLINICAL DATA:  Neck pain and fever, rule out infection. EXAM: MRI CERVICAL SPINE WITHOUT AND WITH CONTRAST TECHNIQUE: Multiplanar and multiecho pulse sequences of the cervical spine, to include the craniocervical junction and cervicothoracic junction, were obtained without and with intravenous contrast. CONTRAST:  4.31mL  GADAVIST GADOBUTROL 1 MMOL/ML IV SOLN COMPARISON:  Neck CT 09/22/2013 FINDINGS: Multiple sequences are moderate to severely motion degraded, limiting evaluation. Alignment: C3-C4 grade 1 retrolisthesis. C3-C4 grade 1 anterolisthesis. 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 There is marked multilevel degenerative endplate irregularity. There is T1 low signal as well as enhancement within the C4, C5, C6 and C7 vertebrae. Additionally, there is prominent marrow edema and enhancement along the C4-C5 and C5-C6 facet joints on the left. Cord: No definite spinal cord signal abnormality or abnormal cord enhancement is identified within the limitations of significant motion degradation. There is extensive ventral epidural phlegmon spanning the C2-T1 levels. This measures up to 5 mm in greatest AP dimension at the C6-C7 level where there also appears to be formed ventral epidural abscess (series 9, image 10) (series 10, image 25). Abnormal enhancement extends into the neural foramina bilaterally at multiple levels. Posterior Fossa, vertebral arteries, paraspinal tissues: There is extensive ventrolateral paravertebral soft tissue swelling, edema and enhancement consistent extending from the level of the skull base to the T1 level. The prevertebral soft tissues are thickened to 9 mm. Findings are most consistent with cellulitis/phlegmon at this site. There are small foci of hypoenhancement ventral to the C6 and C7 vertebrae which may reflect relatively spared musculature. Early small abscesses are not excluded Disc levels: Severe multilevel disc degeneration. C2-C3: Congenital fusion. Ventral epidural phlegmon. No significant spinal canal or neural foraminal narrowing. C3-C4: Posterior disc osteophyte complex. Uncinate, facet and ligamentum flavum hypertrophy. Ventral epidural phlegmon. Moderate to moderately advanced spinal canal stenosis. Mild left neural foraminal  narrowing. C4-C5: Posterior disc osteophyte complex. Uncinate, facet and ligamentum flavum hypertrophy. Ventral epidural phlegmon. Moderate to moderately advanced spinal canal stenosis. Moderate left neural foraminal narrowing C5-C6: Posterior disc osteophyte complex. Uncinate/facet hypertrophy. Ventral epidural phlegmon. Moderate spinal canal stenosis. Mild bilateral neural foraminal narrowing. C6-C7: Posterior disc osteophyte complex. Ventral epidural abscess. Bilateral disc osteophyte ridge/uncinate hypertrophy. Facet hypertrophy. Ventral epidural abscess. Moderate spinal canal stenosis. Moderate bilateral neural foraminal narrowing (greater on the right). C7-T1: Right-sided disc osteophyte ridge. Facet hypertrophy. No significant spinal canal stenosis. Moderate right with mild left neural foraminal narrowing. These results were called by telephone at the time of interpretation on 02/21/2020 at 2:33 pm to provider Dr. Gustavus Messing, who verbally acknowledged these results. IMPRESSION: Multiple sequences are moderate to severely motion degraded, limiting evaluation.  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 Findings consistent with extensive soft tissue infection within the neck. This includes ventrolateral prevertebral cellulitis/phlegmon extending from the skull base to the T1 level. There are small foci of hypoenhancement anterior to the C6 and C7 vertebrae which may reflect relatively spared musculature. Small abscesses are not excluded. Ventral epidural phlegmon spanning the C2-T1 levels. This measures up to 8 mm in greatest AP dimension at the C6-C7 level where there also appears to be formed ventral epidural abscess. Abnormal enhancement extends into the bilateral neural foramina at multiple levels. Edema and enhancement within the C4, C5, C6 and C7 vertebrae. Prominent marrow edema and enhancement are also present along the C4-C5 and C5-C6 facet joints on the left. Findings are highly  suspicious for osteomyelitis given the constellation of findings, although there is moderate to advanced cervical spondylosis. Multilevel spinal canal stenosis greatest at C3-C4 and C4-C5 (moderate to moderately advanced at these levels). Sites of mild and moderate neural foraminal narrowing as described. Electronically Signed   By: Kellie Simmering DO   On: 02/21/2020 14:35   DG Chest Port 1 View  Result Date: 02/21/2020 CLINICAL DATA:  Fever and cough over the last 10 days. EXAM: PORTABLE CHEST 1 VIEW COMPARISON:  02/20/2020 FINDINGS: The heart size and mediastinal contours are within normal limits. Both lungs are clear. The visualized skeletal structures are unremarkable. IMPRESSION: No active disease. Electronically Signed   By: Nelson Chimes M.D.   On: 02/21/2020 10:55   Marzetta Board, MD, PhD [facility]  Between 7 am - 7 pm I am available, please contact me via Amion or Securechat  Between 7 pm - 7 am I am not available, please contact night coverage MD/APP via Amion

## 2020-02-22 NOTE — Progress Notes (Signed)
Subjective: Neck pain has improved. Tolerated oral intake.  Objective: Vital signs in last 24 hours: Temp:  [99 F (37.2 C)-103.1 F (39.5 C)] 103.1 F (39.5 C) (03/20 1601) Pulse Rate:  [97-123] 100 (03/20 1601) Resp:  [15-24] 18 (03/20 1601) BP: (119-145)/(62-93) 122/63 (03/20 1601) SpO2:  [94 %-100 %] 94 % (03/20 1601) Weight:  [51.3 kg] 51.3 kg (03/20 0300)  Physical Exam Vitalsand nursing notereviewed.  Constitutional:  She is not in acute distress. Her skin is warm to touch. Eyes: Pupils are equal, round, reactive to light. Extraocular motion is intact.  Ears: Examination of the ears shows normal auricles and external auditory canals bilaterally.  Nose: Nasal examination shows normal mucosa, septum, turbinates.  Face: Facial examination shows no asymmetry. Palpation of the face elicit no significant tenderness.  Mouth: Oral cavity examination shows no mucosal lacerations, edema, or erythema. No significant trismus is noted.  Neck: Lateral neck is not tender to touch. No palpable mass. No fluctuance. The trachea is midline. The thyroid is not significantly enlarged.  Neuro: Cranial nerves 2-12 are all grossly in tact. Pulmonary: Pulmonary effort is normal. Norespiratory distress.  Skin: Skin is warmand dry. No bruising,erythema,lesionor rash.  Neurological:  No focal deficitpresent. She is alert.  Psychiatric:  Moodnormal.  Behaviornormal.   Recent Labs    02/21/20 1058 02/22/20 0216  WBC 20.8* 20.2*  HGB 9.7* 9.5*  HCT 29.6* 29.8*  PLT 263 273   Recent Labs    02/21/20 1058 02/22/20 0216  NA 135 136  K 3.7 3.4*  CL 101 102  CO2 22 21*  GLUCOSE 117* 103*  BUN 15 13  CREATININE 0.94 0.97  CALCIUM 8.1* 7.9*    Medications:  I have reviewed the patient's current medications. Scheduled: . enoxaparin (LOVENOX) injection  40 mg Subcutaneous Q24H  . ferrous sulfate  325 mg Oral Once per day on Mon Wed Fri  . gabapentin  100 mg Oral TID  .  pantoprazole  40 mg Oral BID AC  . traZODone  100 mg Oral QHS  . traZODone  50 mg Oral Daily  . vitamin B-12  1,250 mcg Oral Daily   Continuous: . sodium chloride 100 mL/hr at 02/22/20 0654  .  ceFAZolin (ANCEF) IV 2 g (02/22/20 1417)   [id number] **OR** acetaminophen, ALPRAZolam, budesonide, HYDROmorphone (DILAUDID) injection, LORazepam, methocarbamol, ondansetron **OR** ondansetron (ZOFRAN) IV  Assessment/Plan: Prevertebral cellulitis/phlegmon and bacteremia. Clinically improving. - Continue IV abx as per ID. - Daily CBC. WBC is still elevated. - Will follow clinically.   LOS: 1 day   Jathen Sudano W Annamarie Yamaguchi 02/22/2020, 4:39 PM

## 2020-02-23 ENCOUNTER — Inpatient Hospital Stay (HOSPITAL_COMMUNITY): Payer: Medicare PPO

## 2020-02-23 DIAGNOSIS — R509 Fever, unspecified: Secondary | ICD-10-CM

## 2020-02-23 DIAGNOSIS — R7881 Bacteremia: Secondary | ICD-10-CM

## 2020-02-23 DIAGNOSIS — L03818 Cellulitis of other sites: Secondary | ICD-10-CM

## 2020-02-23 DIAGNOSIS — R197 Diarrhea, unspecified: Secondary | ICD-10-CM

## 2020-02-23 LAB — CBC
HCT: 27.3 % — ABNORMAL LOW (ref 36.0–46.0)
Hemoglobin: 8.8 g/dL — ABNORMAL LOW (ref 12.0–15.0)
MCH: 29.1 pg (ref 26.0–34.0)
MCHC: 32.2 g/dL (ref 30.0–36.0)
MCV: 90.4 fL (ref 80.0–100.0)
Platelets: 263 10*3/uL (ref 150–400)
RBC: 3.02 MIL/uL — ABNORMAL LOW (ref 3.87–5.11)
RDW: 14.6 % (ref 11.5–15.5)
WBC: 17.8 10*3/uL — ABNORMAL HIGH (ref 4.0–10.5)
nRBC: 0 % (ref 0.0–0.2)

## 2020-02-23 LAB — COMPREHENSIVE METABOLIC PANEL
ALT: 22 U/L (ref 0–44)
AST: 26 U/L (ref 15–41)
Albumin: 2.1 g/dL — ABNORMAL LOW (ref 3.5–5.0)
Alkaline Phosphatase: 92 U/L (ref 38–126)
Anion gap: 12 (ref 5–15)
BUN: 9 mg/dL (ref 8–23)
CO2: 20 mmol/L — ABNORMAL LOW (ref 22–32)
Calcium: 7.5 mg/dL — ABNORMAL LOW (ref 8.9–10.3)
Chloride: 103 mmol/L (ref 98–111)
Creatinine, Ser: 1.01 mg/dL — ABNORMAL HIGH (ref 0.44–1.00)
GFR calc Af Amer: >60 mL/min (ref >60)
GFR calc non Af Amer: 57 mL/min — ABNORMAL LOW (ref >60)
Glucose, Bld: 98 mg/dL (ref 70–99)
Potassium: 3.2 mmol/L — ABNORMAL LOW (ref 3.5–5.1)
Sodium: 135 mmol/L (ref 135–145)
Total Bilirubin: 0.8 mg/dL (ref 0.3–1.2)
Total Protein: 5.9 g/dL — ABNORMAL LOW (ref 6.5–8.1)

## 2020-02-23 LAB — ECHOCARDIOGRAM COMPLETE
Height: 62 in
Weight: 1950.63 oz

## 2020-02-23 LAB — URINE CULTURE: Culture: 10000 — AB

## 2020-02-23 MED ORDER — POTASSIUM CHLORIDE CRYS ER 20 MEQ PO TBCR
40.0000 meq | EXTENDED_RELEASE_TABLET | Freq: Two times a day (BID) | ORAL | Status: AC
  Administered 2020-02-23 (×2): 40 meq via ORAL
  Filled 2020-02-23 (×2): qty 2

## 2020-02-23 NOTE — Progress Notes (Signed)
Subjective: Sore throat resolved. Tolerated regular diet.  Objective: Vital signs in last 24 hours: Temp:  [99 F (37.2 C)-103.1 F (39.5 C)] 100.9 F (38.3 C) (03/21 0752) Pulse Rate:  [97-100] 97 (03/20 2312) Resp:  [16-20] 20 (03/21 0752) BP: (117-127)/(61-65) 127/64 (03/21 0752) SpO2:  [94 %-100 %] 95 % (03/21 0752) Weight:  [55.3 kg] 55.3 kg (03/21 0300)  Physical Exam Vitalsand nursing notereviewed.  Constitutional: She is not in acute distress. Her skin is warm to touch. Eyes: Pupils are equal, round, reactive to light. Extraocular motion is intact.  Ears: Examination of the ears shows normal auricles and external auditory canals bilaterally.  Nose: Nasal examination shows normal mucosa, septum, turbinates.  Face: Facial examination shows no asymmetry. Palpation of the face elicit no significant tenderness.  Mouth: Oral cavity examination shows no mucosal lacerations, edema, or erythema. No significant trismus is noted.  Neck:Lateral neck is not tender to touch. No palpable mass. No fluctuance.The trachea is midline. The thyroid is not significantly enlarged.  Neuro: Cranial nerves 2-12 are all grossly in tact. Pulmonary: Pulmonary effort is normal. Norespiratory distress.  Skin: Skin is warmand dry. No bruising,erythema,lesionor rash.  Neurological: No focal deficitpresent. She is alert.  Psychiatric: Moodnormal. Behaviornormal.   Recent Labs    02/22/20 0216 02/23/20 0529  WBC 20.2* 17.8*  HGB 9.5* 8.8*  HCT 29.8* 27.3*  PLT 273 263   Recent Labs    02/22/20 0216 02/23/20 0529  NA 136 135  K 3.4* 3.2*  CL 102 103  CO2 21* 20*  GLUCOSE 103* 98  BUN 13 9  CREATININE 0.97 1.01*  CALCIUM 7.9* 7.5*    Medications:  Scheduled: . enoxaparin (LOVENOX) injection  40 mg Subcutaneous Q24H  . ferrous sulfate  325 mg Oral Once per day on Mon Wed Fri  . gabapentin  100 mg Oral TID  . pantoprazole  40 mg Oral BID AC  . potassium chloride  40 mEq  Oral BID  . traZODone  100 mg Oral QHS  . traZODone  50 mg Oral Daily  . vitamin B-12  1,250 mcg Oral Daily   Continuous: . sodium chloride 100 mL/hr at 02/22/20 1745  .  ceFAZolin (ANCEF) IV 2 g (02/23/20 0542)   [id number] **OR** acetaminophen, ALPRAZolam, budesonide, HYDROmorphone (DILAUDID) injection, LORazepam, methocarbamol, ondansetron **OR** ondansetron (ZOFRAN) IV  Assessment/Plan: Prevertebralcellulitis/phlegmon and bacteremia. Clinically improving with resolution of throat pain. - Continue IV abx as per ID. - Daily CBC. WBC is decreasing. - Will follow clinically.   LOS: 2 days   Megan Cox W Megan Cox 02/23/2020, 9:39 AM

## 2020-02-23 NOTE — Progress Notes (Signed)
*  PRELIMINARY RESULTS* Echocardiogram 2D Echocardiogram has been performed.  Megan Cox 02/23/2020, 2:39 PM

## 2020-02-23 NOTE — Progress Notes (Signed)
[facility]   Reason for visit: Follow up on cervical infection including osteomyelitis.   Interval History: no new findings of infection on lumbar and thoracic MRI.  Still febrile and WBC remains up.  Feels about the same.  Some loose stools.  No rash.   Physical Exam: Constitutional:  Vitals:   02/23/20 0100 02/23/20 0752  BP:  127/64  Pulse:    Resp:  20  Temp: 99.3 F (37.4 C) (!) 100.9 F (38.3 C)  SpO2:  95%   patient appears in NAD Respiratory: Normal respiratory effort; CTA B Cardiovascular: RRR GI: soft, nt, nd  Review of Systems: Constitutional: negative for fevers and chills Gastrointestinal: negative for nausea  Lab Results  Component Value Date   WBC 17.8 (H) 02/23/2020   HGB 8.8 (L) 02/23/2020   HCT 27.3 (L) 02/23/2020   MCV 90.4 02/23/2020   PLT 263 02/23/2020    Lab Results  Component Value Date   CREATININE 1.01 (H) 02/23/2020   BUN 9 02/23/2020   NA 135 02/23/2020   K 3.2 (L) 02/23/2020   CL 103 02/23/2020   CO2 20 (L) 02/23/2020    Lab Results  Component Value Date   ALT 22 02/23/2020   AST 26 02/23/2020   ALKPHOS 92 02/23/2020     Microbiology: Recent Results (from the past 240 hour(s))  Culture, blood (routine x 2)     Status: Abnormal (Preliminary result)   Collection Time: 02/21/20 10:58 AM   Specimen: BLOOD  Result Value Ref Range Status   Specimen Description BLOOD BLOOD RIGHT FOREARM  Final   Special Requests   Final    BOTTLES DRAWN AEROBIC AND ANAEROBIC Blood Culture adequate volume   Culture  Setup Time   Final    IN BOTH AEROBIC AND ANAEROBIC BOTTLES GRAM POSITIVE COCCI IN CLUSTERS CRITICAL RESULT CALLED TO, READ BACK BY AND VERIFIED WITH: K AMEND PHARMD 02/22/20 0420 JDW    Culture (A)  Final    STAPHYLOCOCCUS AUREUS SUSCEPTIBILITIES TO FOLLOW Performed at [facility], [address], [place], [place] [address]    Report Status PENDING  Incomplete  Blood Culture ID Panel  (Reflexed)     Status: Abnormal   Collection Time: 02/21/20 10:58 AM  Result Value Ref Range Status   Enterococcus species NOT DETECTED NOT DETECTED Final   Listeria monocytogenes NOT DETECTED NOT DETECTED Final   Staphylococcus species DETECTED (A) NOT DETECTED Final    Comment: CRITICAL RESULT CALLED TO, READ BACK BY AND VERIFIED WITH: K AMEND PHARMD 02/22/20 0420 JDW    Staphylococcus aureus (BCID) DETECTED (A) NOT DETECTED Final    Comment: Methicillin (oxacillin) susceptible Staphylococcus aureus (MSSA). Preferred therapy is anti staphylococcal beta lactam antibiotic (Cefazolin or Nafcillin), unless clinically contraindicated. CRITICAL RESULT CALLED TO, READ BACK BY AND VERIFIED WITH: K AMEND PHARMD 02/22/20 0420 JDW    Methicillin resistance NOT DETECTED NOT DETECTED Final   Streptococcus species NOT DETECTED NOT DETECTED Final   Streptococcus agalactiae NOT DETECTED NOT DETECTED Final   Streptococcus pneumoniae NOT DETECTED NOT DETECTED Final   Streptococcus pyogenes NOT DETECTED NOT DETECTED Final   Acinetobacter baumannii NOT DETECTED NOT DETECTED Final   Enterobacteriaceae species NOT DETECTED NOT DETECTED Final   Enterobacter cloacae complex NOT DETECTED NOT DETECTED Final   Escherichia coli NOT DETECTED NOT DETECTED Final   Klebsiella oxytoca NOT DETECTED NOT DETECTED Final   Klebsiella pneumoniae NOT DETECTED NOT DETECTED Final   Proteus  species NOT DETECTED NOT DETECTED Final   Serratia marcescens NOT DETECTED NOT DETECTED Final   Haemophilus influenzae NOT DETECTED NOT DETECTED Final   Neisseria meningitidis NOT DETECTED NOT DETECTED Final   Pseudomonas aeruginosa NOT DETECTED NOT DETECTED Final   Candida albicans NOT DETECTED NOT DETECTED Final   Candida glabrata NOT DETECTED NOT DETECTED Final   Candida krusei NOT DETECTED NOT DETECTED Final   Candida parapsilosis NOT DETECTED NOT DETECTED Final   Candida tropicalis NOT DETECTED NOT DETECTED Final    Comment:  Performed at [facility], [place] [address], [place], [place] [address]  Culture, blood (routine x 2)     Status: Abnormal (Preliminary result)   Collection Time: 02/21/20 11:12 AM   Specimen: BLOOD LEFT ARM  Result Value Ref Range Status   Specimen Description BLOOD LEFT ARM  Final   Special Requests   Final    BOTTLES DRAWN AEROBIC AND ANAEROBIC Blood Culture results may not be optimal due to an inadequate volume of blood received in culture bottles   Culture  Setup Time   Final    IN BOTH AEROBIC AND ANAEROBIC BOTTLES GRAM POSITIVE COCCI IN CLUSTERS CRITICAL VALUE NOTED.  VALUE IS CONSISTENT WITH PREVIOUSLY REPORTED AND CALLED VALUE. Performed at [facility], [place] [address] [place], [place] [address]    Culture STAPHYLOCOCCUS AUREUS (A)  Final   Report Status PENDING  Incomplete  Urine culture     Status: Abnormal   Collection Time: 02/21/20 11:25 AM   Specimen: Urine, Random  Result Value Ref Range Status   Specimen Description URINE, RANDOM  Final   Special Requests   Final    NONE Performed at [facility], [place] [address] [place], [place] [address]    Culture 10,000 COLONIES/mL STAPHYLOCOCCUS AUREUS (A)  Final   Report Status 02/23/2020 FINAL  Final   Organism ID, Bacteria STAPHYLOCOCCUS AUREUS (A)  Final      Susceptibility   Staphylococcus aureus - MIC*    CIPROFLOXACIN <=0.5 SENSITIVE Sensitive     GENTAMICIN <=0.5 SENSITIVE Sensitive     NITROFURANTOIN 32 SENSITIVE Sensitive     OXACILLIN 0.5 SENSITIVE Sensitive     TETRACYCLINE <=1 SENSITIVE Sensitive     VANCOMYCIN <=0.5 SENSITIVE Sensitive     TRIMETH/SULFA <=10 SENSITIVE Sensitive     CLINDAMYCIN <=0.25 SENSITIVE Sensitive     RIFAMPIN <=0.5 SENSITIVE Sensitive     Inducible Clindamycin NEGATIVE Sensitive     * 10,000 COLONIES/mL STAPHYLOCOCCUS AUREUS  Respiratory Panel by RT PCR (Flu A&B, Covid) - Nasopharyngeal Swab     Status: None   Collection Time: 02/21/20  2:45 PM   Specimen:  Nasopharyngeal Swab  Result Value Ref Range Status   SARS Coronavirus 2 by RT PCR NEGATIVE NEGATIVE Final    Comment: (NOTE) SARS-CoV-2 target nucleic acids are NOT DETECTED. The SARS-CoV-2 RNA is generally detectable in upper respiratoy specimens during the acute phase of infection. The lowest concentration of SARS-CoV-2 viral copies this assay can detect is 131 copies/mL. A negative result does not preclude SARS-Cov-2 infection and should not be used as the sole basis for treatment or other patient management decisions. A negative result may occur with  improper specimen collection/handling, submission of specimen other than nasopharyngeal swab, presence of viral mutation(s) within the areas targeted by this assay, and inadequate number of viral copies (<131 copies/mL). A negative result must be combined with clinical observations, patient history, and epidemiological information. The expected  result is Negative. Fact Sheet for Patients:  PinkCheek.be Fact Sheet for Healthcare Providers:  GravelBags.it This test is not yet ap proved or cleared by the [place] FDA and  has been authorized for detection and/or diagnosis of SARS-CoV-2 by FDA under an Emergency Use Authorization (EUA). This EUA will remain  in effect (meaning this test can be used) for the duration of the COVID-19 declaration under Section 564(b)(1) of the Act, 21 U.S.C. section 360bbb-3(b)(1), unless the authorization is terminated or revoked sooner.    Influenza A by PCR NEGATIVE NEGATIVE Final   Influenza B by PCR NEGATIVE NEGATIVE Final    Comment: (NOTE) The Xpert Xpress SARS-CoV-2/FLU/RSV assay is intended as an aid in  the diagnosis of influenza from Nasopharyngeal swab specimens and  should not be used as a sole basis for treatment. Nasal washings and  aspirates are unacceptable for Xpert Xpress SARS-CoV-2/FLU/RSV  testing. Fact Sheet for  Patients: PinkCheek.be Fact Sheet for Healthcare Providers: GravelBags.it This test is not yet approved or cleared by the [place] FDA and  has been authorized for detection and/or diagnosis of SARS-CoV-2 by  FDA under an Emergency Use Authorization (EUA). This EUA will remain  in effect (meaning this test can be used) for the duration of the  Covid-19 declaration under Section 564(b)(1) of the Act, 21  U.S.C. section 360bbb-3(b)(1), unless the authorization is  terminated or revoked. Performed at [facility], [place] [address], [place], [place] [address]     Impression/Plan:  1. 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  Still febrile and repeat blood cultures sent off.  Will need a prolonged course of IV antibiotics.   2.  Medication monitoring - will monitor cbc, bmp.  No changes.    3.  Fever - from #1 and will continue to monitor.

## 2020-02-23 NOTE — Progress Notes (Signed)
Patient continues to spike temp early into shift temp 103.1, tylenol given with good results. IV ABT continued via peripheral acess. PO fluids encouraged as tolerated. Patient made comfortable , OOB to bathroom as needed,assisted with care as requested. Will continue to monitor and observe for change.

## 2020-02-23 NOTE — Progress Notes (Signed)
PROGRESS NOTE  Megan Cox [id number] DOB: October 05, 1951 DOA: 02/21/2020 PCP: Lavone Orn, MD   LOS: 2 days   Brief Narrative / Interim history: 69 year old pleasant female with history of depression/anxiety, eosinophilic esophagitis, arthritis, came into the hospital due to fever, chills, neck as well as bilateral shoulder pain for the past couple of days.  She has been having progressive symptoms that started about 2 weeks ago.  She is also been complaining of intermittent right leg weakness.  She was evaluated by orthopedics as an outpatient and initially was started on supportive treatment which did not help her, including gabapentin, Robaxin as well as prednisone.  Given the fever and the neck pain she was directed to the emergency room.  An MRI showed an extensive soft tissue infection within the neck, ventrolateral prevertebral cellulitis/phlegmon. ENT consulted  Subjective / 24h Interval events: She is feeling better this morning, has less neck pain and was able to sleep more.  Febrile overnight last night but temperature improved this morning.  Assessment & Plan: Principal Problem Sepsis due to prevertebral abscess/phlegmon as well as MSSA bacteremia -Patient initially on broad-spectrum antibiotics on admission, transition to Ancef given MSSA bacteremia -ENT following, discussed with Dr. Benjamine Mola this morning -Still febrile but her white count is coming down. -Obtain a 2D echo to rule out vegetations, pending today -Due to right lower extremity intermittent weakness underwent an MRI of the lumbar and thoracic spine, no discitis/osteomyelitis seen -Because she still febrile hold off obtaining surveillance cultures  Active Problems Eosinophilic esophagitis -On PPI, tolerating diet  Depression -Continue trazodone, Xanax as needed  Hypokalemia -Replete again, recheck tomorrow morning  Iron deficiency anemia -Hemoglobin slightly lower, likely dilutional, continue iron  supplements   Scheduled Meds: . enoxaparin (LOVENOX) injection  40 mg Subcutaneous Q24H  . ferrous sulfate  325 mg Oral Once per day on Mon Wed Fri  . gabapentin  100 mg Oral TID  . pantoprazole  40 mg Oral BID AC  . potassium chloride  40 mEq Oral BID  . traZODone  100 mg Oral QHS  . traZODone  50 mg Oral Daily  . vitamin B-12  1,250 mcg Oral Daily   Continuous Infusions: . sodium chloride 100 mL/hr at 02/22/20 1745  .  ceFAZolin (ANCEF) IV 2 g (02/23/20 0542)   PRN Meds:.acetaminophen **OR** acetaminophen, ALPRAZolam, budesonide, HYDROmorphone (DILAUDID) injection, LORazepam, methocarbamol, ondansetron **OR** ondansetron (ZOFRAN) IV  DVT prophylaxis: Lovenox Code Status: Full code Family Communication: d/w patient  Patient admitted from: home  Anticipated d/c place: home Barriers to d/c: MSSA bacteremia requiring IV antibiotics further investigations with 2D echo, will need surveillance cultures negative at 48 hours even before PICC line is placed  Consultants:  ENT ID  Procedures:  2D echo: pending  Microbiology  Blood cultures 3/19-MSSA 4 out of 4 bottles  Antimicrobials: Cefazolin 3/20 >>   Objective: Vitals:   02/22/20 2312 02/23/20 0100 02/23/20 0300 02/23/20 0752  BP: 117/65   127/64  Pulse: 97     Resp:    20  Temp: 99 F (37.2 C) 99.3 F (37.4 C)  (!) 100.9 F (38.3 C)  TempSrc:  Oral  Oral  SpO2: 94%   95%  Weight:   55.3 kg   Height:       No intake or output data in the 24 hours ending 02/23/20 0940 Filed Weights   02/21/20 0952 02/22/20 0300 02/23/20 0300  Weight: 48.1 kg 51.3 kg 55.3 kg    Examination:  Constitutional:  No distress, sitting in chair Eyes: No scleral icterus ENMT: Moist mucous membranes Neck: normal, supple Respiratory: Clear bilaterally, no wheezing or crackles, normal respiratory effort Cardiovascular: Regular rate and rhythm, no murmurs, no peripheral edema Abdomen: Bowel sounds positive Musculoskeletal: no  clubbing / cyanosis.  Skin: No rash appreciated Neurologic: Grossly nonfocal   Data Reviewed: I have independently reviewed following labs and imaging studies   CBC: Recent Labs  Lab 02/21/20 1058 02/22/20 0216 02/23/20 0529  WBC 20.8* 20.2* 17.8*  NEUTROABS 17.8*  --   --   HGB 9.7* 9.5* 8.8*  HCT 29.6* 29.8* 27.3*  MCV 90.5 92.0 90.4  PLT 263 273 [id number]   Basic Metabolic Panel: Recent Labs  Lab 02/21/20 1058 02/22/20 0216 02/23/20 0529  NA 135 136 135  K 3.7 3.4* 3.2*  CL 101 102 103  CO2 22 21* 20*  GLUCOSE 117* 103* 98  BUN 15 13 9   CREATININE 0.94 0.97 1.01*  CALCIUM 8.1* 7.9* 7.5*   Liver Function Tests: Recent Labs  Lab 02/21/20 1058 02/23/20 0529  AST 22 26  ALT 34 22  ALKPHOS 103 92  BILITOT 0.7 0.8  PROT 6.1* 5.9*  ALBUMIN 2.3* 2.1*   Coagulation Profile: No results for input(s): INR, PROTIME in the last 168 hours. HbA1C: No results for input(s): HGBA1C in the last 72 hours. CBG: No results for input(s): GLUCAP in the last 168 hours.  Recent Results (from the past 240 hour(s))  Culture, blood (routine x 2)     Status: Abnormal (Preliminary result)   Collection Time: 02/21/20 10:58 AM   Specimen: BLOOD  Result Value Ref Range Status   Specimen Description BLOOD BLOOD RIGHT FOREARM  Final   Special Requests   Final    BOTTLES DRAWN AEROBIC AND ANAEROBIC Blood Culture adequate volume   Culture  Setup Time   Final    IN BOTH AEROBIC AND ANAEROBIC BOTTLES GRAM POSITIVE COCCI IN CLUSTERS CRITICAL RESULT CALLED TO, READ BACK BY AND VERIFIED WITH: K AMEND PHARMD 02/22/20 0420 JDW    Culture (A)  Final    STAPHYLOCOCCUS AUREUS SUSCEPTIBILITIES TO FOLLOW Performed at [facility], [place] [address], [place], [place] [address]    Report Status PENDING  Incomplete  Blood Culture ID Panel (Reflexed)     Status: Abnormal   Collection Time: 02/21/20 10:58 AM  Result Value Ref Range Status   Enterococcus species NOT DETECTED NOT DETECTED Final    Listeria monocytogenes NOT DETECTED NOT DETECTED Final   Staphylococcus species DETECTED (A) NOT DETECTED Final    Comment: CRITICAL RESULT CALLED TO, READ BACK BY AND VERIFIED WITH: K AMEND PHARMD 02/22/20 0420 JDW    Staphylococcus aureus (BCID) DETECTED (A) NOT DETECTED Final    Comment: Methicillin (oxacillin) susceptible Staphylococcus aureus (MSSA). Preferred therapy is anti staphylococcal beta lactam antibiotic (Cefazolin or Nafcillin), unless clinically contraindicated. CRITICAL RESULT CALLED TO, READ BACK BY AND VERIFIED WITH: K AMEND PHARMD 02/22/20 0420 JDW    Methicillin resistance NOT DETECTED NOT DETECTED Final   Streptococcus species NOT DETECTED NOT DETECTED Final   Streptococcus agalactiae NOT DETECTED NOT DETECTED Final   Streptococcus pneumoniae NOT DETECTED NOT DETECTED Final   Streptococcus pyogenes NOT DETECTED NOT DETECTED Final   Acinetobacter baumannii NOT DETECTED NOT DETECTED Final   Enterobacteriaceae species NOT DETECTED NOT DETECTED Final   Enterobacter cloacae complex NOT DETECTED NOT DETECTED Final   Escherichia coli NOT DETECTED NOT DETECTED Final   Klebsiella oxytoca NOT DETECTED NOT  DETECTED Final   Klebsiella pneumoniae NOT DETECTED NOT DETECTED Final   Proteus species NOT DETECTED NOT DETECTED Final   Serratia marcescens NOT DETECTED NOT DETECTED Final   Haemophilus influenzae NOT DETECTED NOT DETECTED Final   Neisseria meningitidis NOT DETECTED NOT DETECTED Final   Pseudomonas aeruginosa NOT DETECTED NOT DETECTED Final   Candida albicans NOT DETECTED NOT DETECTED Final   Candida glabrata NOT DETECTED NOT DETECTED Final   Candida krusei NOT DETECTED NOT DETECTED Final   Candida parapsilosis NOT DETECTED NOT DETECTED Final   Candida tropicalis NOT DETECTED NOT DETECTED Final    Comment: Performed at [facility], [place] [address] [place], [place] [address]  Culture, blood (routine x 2)     Status: Abnormal (Preliminary result)   Collection Time:  02/21/20 11:12 AM   Specimen: BLOOD LEFT ARM  Result Value Ref Range Status   Specimen Description BLOOD LEFT ARM  Final   Special Requests   Final    BOTTLES DRAWN AEROBIC AND ANAEROBIC Blood Culture results may not be optimal due to an inadequate volume of blood received in culture bottles   Culture  Setup Time   Final    IN BOTH AEROBIC AND ANAEROBIC BOTTLES GRAM POSITIVE COCCI IN CLUSTERS CRITICAL VALUE NOTED.  VALUE IS CONSISTENT WITH PREVIOUSLY REPORTED AND CALLED VALUE. Performed at [facility], [place] [address] [place],  [address]    Culture STAPHYLOCOCCUS AUREUS (A)  Final   Report Status PENDING  Incomplete  Urine culture     Status: Abnormal   Collection Time: 02/21/20 11:25 AM   Specimen: Urine, Random  Result Value Ref Range Status   Specimen Description URINE, RANDOM  Final   Special Requests   Final    NONE Performed at [facility], [place] [address], [place], [place] [address]    Culture 10,000 COLONIES/mL STAPHYLOCOCCUS AUREUS (A)  Final   Report Status 02/23/2020 FINAL  Final   Organism ID, Bacteria STAPHYLOCOCCUS AUREUS (A)  Final      Susceptibility   Staphylococcus aureus - MIC*    CIPROFLOXACIN <=0.5 SENSITIVE Sensitive     GENTAMICIN <=0.5 SENSITIVE Sensitive     NITROFURANTOIN 32 SENSITIVE Sensitive     OXACILLIN 0.5 SENSITIVE Sensitive     TETRACYCLINE <=1 SENSITIVE Sensitive     VANCOMYCIN <=0.5 SENSITIVE Sensitive     TRIMETH/SULFA <=10 SENSITIVE Sensitive     CLINDAMYCIN <=0.25 SENSITIVE Sensitive     RIFAMPIN <=0.5 SENSITIVE Sensitive     Inducible Clindamycin NEGATIVE Sensitive     * 10,000 COLONIES/mL STAPHYLOCOCCUS AUREUS  Respiratory Panel by RT PCR (Flu A&B, Covid) - Nasopharyngeal Swab     Status: None   Collection Time: 02/21/20  2:45 PM   Specimen: Nasopharyngeal Swab  Result Value Ref Range Status   SARS Coronavirus 2 by RT PCR NEGATIVE NEGATIVE Final    Comment: (NOTE) SARS-CoV-2 target nucleic acids are NOT  DETECTED. The SARS-CoV-2 RNA is generally detectable in upper respiratoy specimens during the acute phase of infection. The lowest concentration of SARS-CoV-2 viral copies this assay can detect is 131 copies/mL. A negative result does not preclude SARS-Cov-2 infection and should not be used as the sole basis for treatment or other patient management decisions. A negative result may occur with  improper specimen collection/handling, submission of specimen other than nasopharyngeal swab, presence of viral mutation(s) within the areas targeted by this assay, and inadequate number of viral copies (<131 copies/mL). A negative  result must be combined with clinical observations, patient history, and epidemiological information. The expected result is Negative. Fact Sheet for Patients:  PinkCheek.be Fact Sheet for Healthcare Providers:  GravelBags.it This test is not yet ap proved or cleared by the [place] FDA and  has been authorized for detection and/or diagnosis of SARS-CoV-2 by FDA under an Emergency Use Authorization (EUA). This EUA will remain  in effect (meaning this test can be used) for the duration of the COVID-19 declaration under Section 564(b)(1) of the Act, 21 U.S.C. section 360bbb-3(b)(1), unless the authorization is terminated or revoked sooner.    Influenza A by PCR NEGATIVE NEGATIVE Final   Influenza B by PCR NEGATIVE NEGATIVE Final    Comment: (NOTE) The Xpert Xpress SARS-CoV-2/FLU/RSV assay is intended as an aid in  the diagnosis of influenza from Nasopharyngeal swab specimens and  should not be used as a sole basis for treatment. Nasal washings and  aspirates are unacceptable for Xpert Xpress SARS-CoV-2/FLU/RSV  testing. Fact Sheet for Patients: PinkCheek.be Fact Sheet for Healthcare Providers: GravelBags.it This test is not yet approved or cleared  by the [place] FDA and  has been authorized for detection and/or diagnosis of SARS-CoV-2 by  FDA under an Emergency Use Authorization (EUA). This EUA will remain  in effect (meaning this test can be used) for the duration of the  Covid-19 declaration under Section 564(b)(1) of the Act, 21  U.S.C. section 360bbb-3(b)(1), unless the authorization is  terminated or revoked. Performed at [facility], [place] [address] [place], [place] [address]      Radiology Studies: MR THORACIC SPINE WO CONTRAST  Result Date: 02/22/2020 CLINICAL DATA:  MSSA bacteremia, leg weakness. Cervical infection EXAM: MRI THORACIC AND LUMBAR SPINE WITHOUT CONTRAST TECHNIQUE: Multiplanar and multiecho pulse sequences of the thoracic and lumbar spine were obtained without intravenous contrast. COMPARISON:  MRI cervical spine 02/21/2020 FINDINGS: A portion of the cervical spine was included within the field of view on the sagittal images again demonstrating extensive infectious findings including prominent prevertebral phlegmon extending from C2 to T1 (series 21, image 8). Ventral epidural phlegmonous changes are also noted from the C2 level extending down to approximately T1-T2 (series 21, image 9). Fluid signal within the C5-6 and C6-7 discs with marrow edema in the C5, C6, and C7 vertebral bodies compatible with discitis-osteomyelitis. MRI THORACIC SPINE FINDINGS Alignment: 2-3 mm grade 1 anterolisthesis T3 on T4. Normal thoracic kyphosis. Vertebrae: The thoracic vertebral body heights are maintained without evidence of fracture. There are a few scattered intraosseous hemangiomas, notably within the T2 and T7 vertebral bodies. No evidence of discitis within the thoracic spine. No evidence of osteomyelitis within the thoracic spine. No suspicious marrow replacing lesion. Cord:  Normal signal and morphology. Paraspinal and other soft tissues: Prevertebral phlegmon and ventral epidural phlegmon again noted extending from  the cervical region down to the T1-2 level, better characterized on small field-of-view cervical spine MRI 02/21/2020. Disc levels: No significant disc protrusion. No foraminal or canal stenosis is seen within the thoracic spine. MRI LUMBAR SPINE FINDINGS Segmentation:  Standard. Alignment:  Trace retrolisthesis L3 on L4 and L4 on L5. Vertebrae: No fracture, evidence of discitis, or bone lesion. Incidental intraosseous hemangioma within the L1 vertebral body. Multilevel discogenic endplate marrow changes. Conus medullaris and cauda equina: Conus extends to the L1-2 level. Conus and cauda equina appear normal. Paraspinal and other soft tissues: Negative. Disc levels: T12-L1: No significant disc protrusion, foraminal stenosis, or canal stenosis. L1-L2: Mild diffuse  disc bulge. No foraminal or canal stenosis. L2-L3: Mild diffuse disc bulge. No foraminal or canal stenosis. L3-L4: Diffuse disc bulge with bilateral facet arthropathy and ligamentum flavum buckling resulting in mild canal stenosis and mild right foraminal stenosis. There is a lobulated T1 isointense, T2 slightly hyperintense structure within the ventral epidural space posterior to the L4 vertebral body (series 1, image 8; series 4, images 15-16) which may reflect a large disc herniation, possibly arising from the L3-4 disc. This causes mild canal stenosis at the L4 level. Material extends to the L4-5 subarticular recess on the left (series 4, image 17). L4-L5: Severe left subarticular recess stenosis from extension of probable disc material. Bilateral facet arthropathy contribute to mild left foraminal stenosis and mild canal stenosis. There is a posterior facet synovial cyst on the left at L4-5 (series 2, image 12). L5-S1: Mild disc bulge and bilateral facet arthrosis result in mild left foraminal stenosis. No canal stenosis. IMPRESSION: MRI Thoracic Spine: 1. No evidence for thoracic discitis-osteomyelitis. 2. 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 No appreciable interval change from prior. 3. No significant disc protrusion, foraminal stenosis, or canal stenosis. MRI Lumbar Spine: 1. No evidence for lumbar discitis-osteomyelitis. 2. Mild multilevel spondylosis with mild canal stenosis at L3-4 and L4-5. 3. Lobulated T1 isointense structure within the ventral epidural space posterior to the L4 vertebral body which may reflect a large disc herniation, possibly arising from the L3-4 disc. This causes mild canal stenosis at L4 level and severe left L4-5 subarticular recess stenosis. Electronically Signed   By: Davina Poke D.O.   On: 02/22/2020 14:41   MR LUMBAR SPINE WO CONTRAST  Result Date: 02/22/2020 CLINICAL DATA:  MSSA bacteremia, leg weakness. Cervical infection EXAM: MRI THORACIC AND LUMBAR SPINE WITHOUT CONTRAST TECHNIQUE: Multiplanar and multiecho pulse sequences of the thoracic and lumbar spine were obtained without intravenous contrast. COMPARISON:  MRI cervical spine 02/21/2020 FINDINGS: A portion of the cervical spine was included within the field of view on the sagittal images again demonstrating extensive infectious findings including prominent prevertebral phlegmon extending from C2 to T1 (series 21, image 8). Ventral epidural phlegmonous changes are also noted from the C2 level extending down to approximately T1-T2 (series 21, image 9). Fluid signal within the C5-6 and C6-7 discs with marrow edema in the C5, C6, and C7 vertebral bodies compatible with discitis-osteomyelitis. MRI THORACIC SPINE FINDINGS Alignment: 2-3 mm grade 1 anterolisthesis T3 on T4. Normal thoracic kyphosis. Vertebrae: The thoracic vertebral body heights are maintained without evidence of fracture. There are a few scattered intraosseous hemangiomas, notably within the T2 and T7 vertebral bodies. No evidence of  discitis within the thoracic spine. No evidence of osteomyelitis within the thoracic spine. No suspicious marrow replacing lesion. Cord:  Normal signal and morphology. Paraspinal and other soft tissues: Prevertebral phlegmon and ventral epidural phlegmon again noted extending from the cervical region down to the T1-2 level, better characterized on small field-of-view cervical spine MRI 02/21/2020. Disc levels: No significant disc protrusion. No foraminal or canal stenosis is seen within the thoracic spine. MRI LUMBAR SPINE FINDINGS Segmentation:  Standard. Alignment:  Trace retrolisthesis L3 on L4 and L4 on L5. Vertebrae: No fracture, evidence of discitis, or bone lesion. Incidental intraosseous hemangioma within the L1 vertebral body. Multilevel discogenic endplate marrow changes. Conus medullaris and cauda equina: Conus extends to the L1-2 level. Conus and cauda equina  appear normal. Paraspinal and other soft tissues: Negative. Disc levels: T12-L1: No significant disc protrusion, foraminal stenosis, or canal stenosis. L1-L2: Mild diffuse disc bulge. No foraminal or canal stenosis. L2-L3: Mild diffuse disc bulge. No foraminal or canal stenosis. L3-L4: Diffuse disc bulge with bilateral facet arthropathy and ligamentum flavum buckling resulting in mild canal stenosis and mild right foraminal stenosis. There is a lobulated T1 isointense, T2 slightly hyperintense structure within the ventral epidural space posterior to the L4 vertebral body (series 1, image 8; series 4, images 15-16) which may reflect a large disc herniation, possibly arising from the L3-4 disc. This causes mild canal stenosis at the L4 level. Material extends to the L4-5 subarticular recess on the left (series 4, image 17). L4-L5: Severe left subarticular recess stenosis from extension of probable disc material. Bilateral facet arthropathy contribute to mild left foraminal stenosis and mild canal stenosis. There is a posterior facet synovial cyst on  the left at L4-5 (series 2, image 12). L5-S1: Mild disc bulge and bilateral facet arthrosis result in mild left foraminal stenosis. No canal stenosis. IMPRESSION: MRI Thoracic Spine: 1. No evidence for thoracic discitis-osteomyelitis. 2. 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 No appreciable interval change from prior. 3. No significant disc protrusion, foraminal stenosis, or canal stenosis. MRI Lumbar Spine: 1. No evidence for lumbar discitis-osteomyelitis. 2. Mild multilevel spondylosis with mild canal stenosis at L3-4 and L4-5. 3. Lobulated T1 isointense structure within the ventral epidural space posterior to the L4 vertebral body which may reflect a large disc herniation, possibly arising from the L3-4 disc. This causes mild canal stenosis at L4 level and severe left L4-5 subarticular recess stenosis. Electronically Signed   By: Davina Poke D.O.   On: 02/22/2020 14:41   Marzetta Board, MD, PhD [facility]  Between 7 am - 7 pm I am available, please contact me via Amion or Securechat  Between 7 pm - 7 am I am not available, please contact night coverage MD/APP via Amion

## 2020-02-24 DIAGNOSIS — R7881 Bacteremia: Secondary | ICD-10-CM | POA: Diagnosis present

## 2020-02-24 LAB — BASIC METABOLIC PANEL
Anion gap: 11 (ref 5–15)
BUN: 8 mg/dL (ref 8–23)
CO2: 21 mmol/L — ABNORMAL LOW (ref 22–32)
Calcium: 7.7 mg/dL — ABNORMAL LOW (ref 8.9–10.3)
Chloride: 103 mmol/L (ref 98–111)
Creatinine, Ser: 0.81 mg/dL (ref 0.44–1.00)
GFR calc Af Amer: >60 mL/min (ref >60)
GFR calc non Af Amer: >60 mL/min (ref >60)
Glucose, Bld: 108 mg/dL — ABNORMAL HIGH (ref 70–99)
Potassium: 3.5 mmol/L (ref 3.5–5.1)
Sodium: 135 mmol/L (ref 135–145)

## 2020-02-24 LAB — CBC
HCT: 24.8 % — ABNORMAL LOW (ref 36.0–46.0)
Hemoglobin: 8 g/dL — ABNORMAL LOW (ref 12.0–15.0)
MCH: 29 pg (ref 26.0–34.0)
MCHC: 32.3 g/dL (ref 30.0–36.0)
MCV: 89.9 fL (ref 80.0–100.0)
Platelets: 309 10*3/uL (ref 150–400)
RBC: 2.76 MIL/uL — ABNORMAL LOW (ref 3.87–5.11)
RDW: 14.5 % (ref 11.5–15.5)
WBC: 11.5 10*3/uL — ABNORMAL HIGH (ref 4.0–10.5)
nRBC: 0 % (ref 0.0–0.2)

## 2020-02-24 MED ORDER — BUDESONIDE 0.5 MG/2ML IN SUSP: 1.0000 mg | Freq: Every day | RESPIRATORY_TRACT | Status: DC

## 2020-02-24 MED ORDER — NONFORMULARY OR COMPOUNDED ITEM
1.0000 mg | Freq: Every day | Status: DC
  Administered 2020-02-24: 1 mg via ORAL
  Filled 2020-02-24: qty 1

## 2020-02-24 MED ORDER — POTASSIUM CHLORIDE CRYS ER 20 MEQ PO TBCR
30.0000 meq | EXTENDED_RELEASE_TABLET | Freq: Once | ORAL | Status: AC
  Administered 2020-02-24: 30 meq via ORAL
  Filled 2020-02-24: qty 1

## 2020-02-24 MED ORDER — BUDESONIDE 0.25 MG/2ML IN SUSP: 0.2500 mg | Freq: Every day | RESPIRATORY_TRACT | Status: DC

## 2020-02-24 MED ORDER — OXYMETAZOLINE HCL 0.05 % NA SOLN
1.0000 | Freq: Two times a day (BID) | NASAL | Status: DC | PRN
  Administered 2020-02-24: 1 via NASAL
  Filled 2020-02-24: qty 30

## 2020-02-24 NOTE — Progress Notes (Signed)
Physical Therapy Treatment Patient Details Name: Megan Cox MRN: [id number] DOB: Jun 08, 1951 Today's Date: 02/24/2020    History of Present Illness Pt is a 69 y.o. female with medical history significant of depression/anxiety, eosinophilic esophagitis, arthritis presents to emergency department due to fever, chills, neck, bilateral shoulder and bilateral hip pain.     PT Comments    Pt with improved stability and ambulation tolerance today. Pt with 2.5/4 DOE and SPo2 >95% on RA during ambulation and DGI. Pt at minimal falls risk as indicate by score of 21/24 on DGI. Suspect pt may progress well enough and not need HH if she stay much longer. Discussed energy conservation strategies as well as pt is home alone. Acute PT to cont to follow.    Follow Up Recommendations  Home health PT     Equipment Recommendations  None recommended by PT    Recommendations for Other Services       Precautions / Restrictions Precautions Precautions: Fall Restrictions Weight Bearing Restrictions: No    Mobility  Bed Mobility               General bed mobility comments: pt up in chair upon PT arrival  Transfers Overall transfer level: Modified independent Equipment used: None Transfers: Sit to/from Stand Sit to Stand: Modified independent (Device/Increase time)         General transfer comment: pt with no difficulty, steady, has been taking self to bathroom and pushing IV pole  Ambulation/Gait Ambulation/Gait assistance: Min guard Gait Distance (Feet): 300 Feet Assistive device: None Gait Pattern/deviations: Step-through pattern;Decreased step length - right;Decreased step length - left;Narrow base of support Gait velocity: dec Gait velocity interpretation: 1.31 - 2.62 ft/sec, indicative of limited community ambulator General Gait Details: pt mildly shaky with antalgia due to L hip pain but no overt episode of LOB, pt with DOE 2.5/4, SPO2 >95% on RA   Stairs              Wheelchair Mobility    Modified Rankin (Stroke Patients Only)       Balance     Sitting balance-Leahy Scale: Normal       Standing balance-Leahy Scale: Fair                   Standardized Balance Assessment Standardized Balance Assessment : Dynamic Gait Index   Dynamic Gait Index Level Surface: Normal Change in Gait Speed: Normal Gait with Horizontal Head Turns: Normal Gait with Vertical Head Turns: Normal Gait and Pivot Turn: Normal Step Over Obstacle: Mild Impairment Step Around Obstacles: Mild Impairment Steps: Mild Impairment Total Score: 21      Cognition Arousal/Alertness: Awake/alert Behavior During Therapy: WFL for tasks assessed/performed(mildy anxious per pt report) Overall Cognitive Status: Within Functional Limits for tasks assessed                                        Exercises      General Comments General comments (skin integrity, edema, etc.): vss      Pertinent Vitals/Pain Pain Assessment: Faces Faces Pain Scale: Hurts even more Pain Location: L hip Pain Descriptors / Indicators: Discomfort Pain Intervention(s): Monitored during session    Home Living                      Prior Function            PT  Goals (current goals can now be found in the care plan section) Acute Rehab PT Goals Patient Stated Goal: to shower at home Progress towards PT goals: Progressing toward goals    Frequency    Min 3X/week      PT Plan Current plan remains appropriate    Co-evaluation              AM-PAC PT "6 Clicks" Mobility   Outcome Measure  Help needed turning from your back to your side while in a flat bed without using bedrails?: None Help needed moving from lying on your back to sitting on the side of a flat bed without using bedrails?: None Help needed moving to and from a bed to a chair (including a wheelchair)?: None Help needed standing up from a chair using your arms (e.g.,  wheelchair or bedside chair)?: None Help needed to walk in hospital room?: None Help needed climbing 3-5 steps with a railing? : A Little 6 Click Score: 23    End of Session Equipment Utilized During Treatment: Gait belt Activity Tolerance: Patient tolerated treatment well;Patient limited by fatigue Patient left: in chair;with call bell/phone within reach Nurse Communication: Mobility status PT Visit Diagnosis: Unsteadiness on feet (R26.81);Muscle weakness (generalized) (M62.81);Pain Pain - Right/Left: Left Pain - part of body: Hip     Time: [id number] PT Time Calculation (min) (ACUTE ONLY): 29 min  Charges:  $Gait Training: 23-37 mins                     Kittie Plater, PT, DPT Acute Rehabilitation Services Pager #: [phone] Office #: [phone]    Berline Lopes 02/24/2020, 2:47 PM

## 2020-02-24 NOTE — Progress Notes (Signed)
Subjective: No sore throat or neck pain. Tolerated oral intake.  Objective: Vital signs in last 24 hours: Temp:  [97.7 F (36.5 C)-100.9 F (38.3 C)] 99.8 F (37.7 C) (03/21 2234) Pulse Rate:  [79-96] 91 (03/21 2234) Resp:  [16-20] 16 (03/21 2234) BP: (125-145)/(60-93) 143/74 (03/21 2234) SpO2:  [95 %-100 %] 98 % (03/21 2234)  Physical Exam Vitalsand nursing notereviewed.  Constitutional: She is not in acute distress.  Eyes: Pupils are equal, round, reactive to light. Extraocular motion is intact.  Ears: Examination of the ears shows normal auricles and external auditory canals bilaterally.  Nose: Nasal examination shows normal mucosa, septum, turbinates.  Face: Facial examination shows no asymmetry. Palpation of the face elicit no significant tenderness.  Mouth: Oral cavity examination shows no mucosal lacerations, edema, or erythema. No significant trismus is noted.  Neck:Lateral neck isnottender to touch. No palpable mass. No fluctuance.The trachea is midline. The thyroid is not significantly enlarged.  Neuro: Cranial nerves 2-12 are all grossly in tact. Pulmonary: Pulmonary effort is normal. Norespiratory distress.  Skin: Skin is warmand dry. No bruising,erythema,lesionor rash.  Neurological: No focal deficitpresent. She is alert.  Psychiatric: Moodnormal. Behaviornormal.   Recent Labs    02/23/20 0529 02/24/20 0512  WBC 17.8* 11.5*  HGB 8.8* 8.0*  HCT 27.3* 24.8*  PLT 263 309   Recent Labs    02/23/20 0529 02/24/20 0512  NA 135 135  K 3.2* 3.5  CL 103 103  CO2 20* 21*  GLUCOSE 98 108*  BUN 9 8  CREATININE 1.01* 0.81  CALCIUM 7.5* 7.7*    Medications:  I have reviewed the patient's current medications. Scheduled: . enoxaparin (LOVENOX) injection  40 mg Subcutaneous Q24H  . ferrous sulfate  325 mg Oral Once per day on Mon Wed Fri  . gabapentin  100 mg Oral TID  . pantoprazole  40 mg Oral BID AC  . traZODone  100 mg Oral QHS  .  traZODone  50 mg Oral Daily  . vitamin B-12  1,250 mcg Oral Daily   Continuous: . sodium chloride 100 mL/hr at 02/23/20 1510  .  ceFAZolin (ANCEF) IV 2 g (02/24/20 0610)   [id number] **OR** acetaminophen, ALPRAZolam, budesonide, HYDROmorphone (DILAUDID) injection, LORazepam, methocarbamol, ondansetron **OR** ondansetron (ZOFRAN) IV  Assessment/Plan: Prevertebralcellulitis/phlegmonand bacteremia.Clinically improving. - Continue abx as per ID. Likely will not need any surgical intervention. - Daily CBC.WBC is decreasing. - Will follow clinically.   LOS: 3 days   Makayle Krahn W Drayke Grabel 02/24/2020, 7:37 AM

## 2020-02-24 NOTE — Progress Notes (Signed)
PHARMACY CONSULT NOTE FOR:  OUTPATIENT  PARENTERAL ANTIBIOTIC THERAPY (OPAT)  Indication: MSSA bacteremia Regimen: Cefazolin (Ancef) 2 g IV q8h  End date: 04/03/2020  IV antibiotic discharge orders are pended. To discharging provider:  please sign these orders via discharge navigator,  Select New Orders & click on the button choice - Manage This Unsigned Work.     Thank you for allowing pharmacy to be a part of this patient's care.  Claudina Lick, PharmD Candidate  02/24/2020, 2:02 PM

## 2020-02-24 NOTE — Progress Notes (Signed)
Patient ID: Megan Cox, female   DOB: 1951/05/25, 69 y.o.   MRN: [id number]         [facility] Infectious Disease  Date of Admission:  02/21/2020           Day 4 cefazolin ASSESSMENT: Has MSSA bacteremia complicated by smoldering cervical spine infection.  Repeat blood cultures are negative at 24 hours.  If they remain negative tomorrow she can have a PICC placed.  No evidence of endocarditis by TTE.  I would not pursue TEE since it will not change her medical management.  PLAN: 1. Continue cefazolin 2. PICC placement tomorrow if blood cultures remain negative  Diagnosis: Cervical spine infection and bacteremia  Culture Result: MSSA  Allergies  Allergen Reactions  . Contrast Media [Iodinated Diagnostic Agents] Anaphylaxis, Shortness Of Breath and Swelling    PATIENT STATES THAT HER BROTHER HAD ANAPHYLAXIS WITH IV CONTRAST AND WANTS TO SHARE THIS FAMILY HISTORY. Brother died from reaction    OPAT Orders Discharge antibiotics: Per pharmacy protocol cefazolin  Duration: 6 weeks End Date: 04/03/2020  [facility] Care Per Protocol:  Home health RN for IV administration and teaching; PICC line care and labs.    Labs weekly while on IV antibiotics: _x_ CBC with differential _x_ BMP __ CMP _x_ CRP _x_ ESR __ Vancomycin trough __ CK  __ Please pull PIC at completion of IV antibiotics __ Please leave PIC in place until doctor has seen patient or been notified  Fax weekly labs to [phone]  Clinic Follow Up Appt: 04/01/2020  Principal Problem:   Bacteremia due to methicillin susceptible Staphylococcus aureus (MSSA) Active Problems:   Abscess in epidural space of cervical spine   Depression   Sepsis ([place])   Anemia   Eosinophilic esophagitis   Scheduled Meds: . budesonide (PULMICORT) nebulizer solution  1 mg Nebulization QHS  . enoxaparin (LOVENOX) injection  40 mg Subcutaneous Q24H  . ferrous sulfate  325 mg Oral Once per day on Mon Wed Fri  .  gabapentin  100 mg Oral TID  . pantoprazole  40 mg Oral BID AC  . potassium chloride  30 mEq Oral Once  . traZODone  100 mg Oral QHS  . traZODone  50 mg Oral Daily  . vitamin B-12  1,250 mcg Oral Daily   Continuous Infusions: .  ceFAZolin (ANCEF) IV 2 g (02/24/20 0610)   PRN Meds:.acetaminophen **OR** acetaminophen, ALPRAZolam, budesonide, HYDROmorphone (DILAUDID) injection, LORazepam, methocarbamol, ondansetron **OR** ondansetron (ZOFRAN) IV, oxymetazoline   SUBJECTIVE: [name] tells me "I guess my pain is getting better ".  Diarrhea yesterday but none today.  Review of Systems: Review of Systems  Constitutional: Positive for chills. Negative for diaphoresis and fever.  Gastrointestinal: Negative for abdominal pain, diarrhea, nausea and vomiting.  Musculoskeletal: Positive for back pain, joint pain and neck pain.    Allergies  Allergen Reactions  . Contrast Media [Iodinated Diagnostic Agents] Anaphylaxis, Shortness Of Breath and Swelling    PATIENT STATES THAT HER BROTHER HAD ANAPHYLAXIS WITH IV CONTRAST AND WANTS TO SHARE THIS FAMILY HISTORY. Brother died from reaction    OBJECTIVE: Vitals:   02/23/20 1609 02/23/20 2000 02/23/20 2234 02/24/20 0811  BP: 127/60 (!) 145/93 (!) 143/74 114/64  Pulse: 85 96 91 84  Resp:  18 16 20   Temp: 97.7 F (36.5 C) 98.4 F (36.9 C) 99.8 F (37.7 C) 99.1 F (37.3 C)  TempSrc:  Oral Oral   SpO2: 99% 100% 98% 100%  Weight:  Height:       Body mass index is 22.3 kg/m.  Physical Exam Constitutional:      Comments: She appears comfortable sitting up in bed.  She is talkative.     Lab Results Lab Results  Component Value Date   WBC 11.5 (H) 02/24/2020   HGB 8.0 (L) 02/24/2020   HCT 24.8 (L) 02/24/2020   MCV 89.9 02/24/2020   PLT 309 02/24/2020    Lab Results  Component Value Date   CREATININE 0.81 02/24/2020   BUN 8 02/24/2020   NA 135 02/24/2020   K 3.5 02/24/2020   CL 103 02/24/2020   CO2 21 (L) 02/24/2020    Lab  Results  Component Value Date   ALT 22 02/23/2020   AST 26 02/23/2020   ALKPHOS 92 02/23/2020   BILITOT 0.8 02/23/2020     Microbiology: Recent Results (from the past 240 hour(s))  Culture, blood (routine x 2)     Status: Abnormal (Preliminary result)   Collection Time: 02/21/20 10:58 AM   Specimen: BLOOD  Result Value Ref Range Status   Specimen Description BLOOD BLOOD RIGHT FOREARM  Final   Special Requests   Final    BOTTLES DRAWN AEROBIC AND ANAEROBIC Blood Culture adequate volume   Culture  Setup Time   Final    IN BOTH AEROBIC AND ANAEROBIC BOTTLES GRAM POSITIVE COCCI IN CLUSTERS CRITICAL RESULT CALLED TO, READ BACK BY AND VERIFIED WITH: K AMEND PHARMD 02/22/20 0420 JDW    Culture (A)  Final    STAPHYLOCOCCUS AUREUS REPEATING SUSCEPTIBILITIES Performed at [facility], [place] [address] [place], [place] [address]    Report Status PENDING  Incomplete  Blood Culture ID Panel (Reflexed)     Status: Abnormal   Collection Time: 02/21/20 10:58 AM  Result Value Ref Range Status   Enterococcus species NOT DETECTED NOT DETECTED Final   Listeria monocytogenes NOT DETECTED NOT DETECTED Final   Staphylococcus species DETECTED (A) NOT DETECTED Final    Comment: CRITICAL RESULT CALLED TO, READ BACK BY AND VERIFIED WITH: K AMEND PHARMD 02/22/20 0420 JDW    Staphylococcus aureus (BCID) DETECTED (A) NOT DETECTED Final    Comment: Methicillin (oxacillin) susceptible Staphylococcus aureus (MSSA). Preferred therapy is anti staphylococcal beta lactam antibiotic (Cefazolin or Nafcillin), unless clinically contraindicated. CRITICAL RESULT CALLED TO, READ BACK BY AND VERIFIED WITH: K AMEND PHARMD 02/22/20 0420 JDW    Methicillin resistance NOT DETECTED NOT DETECTED Final   Streptococcus species NOT DETECTED NOT DETECTED Final   Streptococcus agalactiae NOT DETECTED NOT DETECTED Final   Streptococcus pneumoniae NOT DETECTED NOT DETECTED Final   Streptococcus pyogenes NOT DETECTED NOT  DETECTED Final   Acinetobacter baumannii NOT DETECTED NOT DETECTED Final   Enterobacteriaceae species NOT DETECTED NOT DETECTED Final   Enterobacter cloacae complex NOT DETECTED NOT DETECTED Final   Escherichia coli NOT DETECTED NOT DETECTED Final   Klebsiella oxytoca NOT DETECTED NOT DETECTED Final   Klebsiella pneumoniae NOT DETECTED NOT DETECTED Final   Proteus species NOT DETECTED NOT DETECTED Final   Serratia marcescens NOT DETECTED NOT DETECTED Final   Haemophilus influenzae NOT DETECTED NOT DETECTED Final   Neisseria meningitidis NOT DETECTED NOT DETECTED Final   Pseudomonas aeruginosa NOT DETECTED NOT DETECTED Final   Candida albicans NOT DETECTED NOT DETECTED Final   Candida glabrata NOT DETECTED NOT DETECTED Final   Candida krusei NOT DETECTED NOT DETECTED Final   Candida parapsilosis NOT DETECTED NOT DETECTED Final   Candida tropicalis  NOT DETECTED NOT DETECTED Final    Comment: Performed at [facility], [place] [address], [place], [place] [address]  Culture, blood (routine x 2)     Status: Abnormal (Preliminary result)   Collection Time: 02/21/20 11:12 AM   Specimen: BLOOD LEFT ARM  Result Value Ref Range Status   Specimen Description BLOOD LEFT ARM  Final   Special Requests   Final    BOTTLES DRAWN AEROBIC AND ANAEROBIC Blood Culture results may not be optimal due to an inadequate volume of blood received in culture bottles   Culture  Setup Time   Final    IN BOTH AEROBIC AND ANAEROBIC BOTTLES GRAM POSITIVE COCCI IN CLUSTERS CRITICAL VALUE NOTED.  VALUE IS CONSISTENT WITH PREVIOUSLY REPORTED AND CALLED VALUE. Performed at [facility], [place] [address] [place], [place] [address]    Culture STAPHYLOCOCCUS AUREUS (A)  Final   Report Status PENDING  Incomplete  Urine culture     Status: Abnormal   Collection Time: 02/21/20 11:25 AM   Specimen: Urine, Random  Result Value Ref Range Status   Specimen Description URINE, RANDOM  Final   Special Requests   Final     NONE Performed at [facility], [place] [address] [place], [place] [address]    Culture 10,000 COLONIES/mL STAPHYLOCOCCUS AUREUS (A)  Final   Report Status 02/23/2020 FINAL  Final   Organism ID, Bacteria STAPHYLOCOCCUS AUREUS (A)  Final      Susceptibility   Staphylococcus aureus - MIC*    CIPROFLOXACIN <=0.5 SENSITIVE Sensitive     GENTAMICIN <=0.5 SENSITIVE Sensitive     NITROFURANTOIN 32 SENSITIVE Sensitive     OXACILLIN 0.5 SENSITIVE Sensitive     TETRACYCLINE <=1 SENSITIVE Sensitive     VANCOMYCIN <=0.5 SENSITIVE Sensitive     TRIMETH/SULFA <=10 SENSITIVE Sensitive     CLINDAMYCIN <=0.25 SENSITIVE Sensitive     RIFAMPIN <=0.5 SENSITIVE Sensitive     Inducible Clindamycin NEGATIVE Sensitive     * 10,000 COLONIES/mL STAPHYLOCOCCUS AUREUS  Respiratory Panel by RT PCR (Flu A&B, Covid) - Nasopharyngeal Swab     Status: None   Collection Time: 02/21/20  2:45 PM   Specimen: Nasopharyngeal Swab  Result Value Ref Range Status   SARS Coronavirus 2 by RT PCR NEGATIVE NEGATIVE Final    Comment: (NOTE) SARS-CoV-2 target nucleic acids are NOT DETECTED. The SARS-CoV-2 RNA is generally detectable in upper respiratoy specimens during the acute phase of infection. The lowest concentration of SARS-CoV-2 viral copies this assay can detect is 131 copies/mL. A negative result does not preclude SARS-Cov-2 infection and should not be used as the sole basis for treatment or other patient management decisions. A negative result may occur with  improper specimen collection/handling, submission of specimen other than nasopharyngeal swab, presence of viral mutation(s) within the areas targeted by this assay, and inadequate number of viral copies (<131 copies/mL). A negative result must be combined with clinical observations, patient history, and epidemiological information. The expected result is Negative. Fact Sheet for Patients:  PinkCheek.be Fact Sheet for  Healthcare Providers:  GravelBags.it This test is not yet ap proved or cleared by the [place] FDA and  has been authorized for detection and/or diagnosis of SARS-CoV-2 by FDA under an Emergency Use Authorization (EUA). This EUA will remain  in effect (meaning this test can be used) for the duration of the COVID-19 declaration under Section 564(b)(1) of the Act, 21 U.S.C. section 360bbb-3(b)(1), unless the authorization  is terminated or revoked sooner.    Influenza A by PCR NEGATIVE NEGATIVE Final   Influenza B by PCR NEGATIVE NEGATIVE Final    Comment: (NOTE) The Xpert Xpress SARS-CoV-2/FLU/RSV assay is intended as an aid in  the diagnosis of influenza from Nasopharyngeal swab specimens and  should not be used as a sole basis for treatment. Nasal washings and  aspirates are unacceptable for Xpert Xpress SARS-CoV-2/FLU/RSV  testing. Fact Sheet for Patients: PinkCheek.be Fact Sheet for Healthcare Providers: GravelBags.it This test is not yet approved or cleared by the [place] FDA and  has been authorized for detection and/or diagnosis of SARS-CoV-2 by  FDA under an Emergency Use Authorization (EUA). This EUA will remain  in effect (meaning this test can be used) for the duration of the  Covid-19 declaration under Section 564(b)(1) of the Act, 21  U.S.C. section 360bbb-3(b)(1), unless the authorization is  terminated or revoked. Performed at [facility], [place] [address], [place], [place] [address]     Michel Bickers, [place] [facility] [phone] pager   [phone] cell 02/24/2020, 10:05 AM

## 2020-02-24 NOTE — Progress Notes (Signed)
PROGRESS NOTE  Megan Cox [id number] DOB: June 25, 1951 DOA: 02/21/2020 PCP: Lavone Orn, MD   LOS: 3 days   Brief Narrative / Interim history: 69 year old pleasant female with history of depression/anxiety, eosinophilic esophagitis, arthritis, came into the hospital due to fever, chills, neck as well as bilateral shoulder pain for the past couple of days.  She has been having progressive symptoms that started about 2 weeks ago.  She is also been complaining of intermittent right leg weakness.  She was evaluated by orthopedics as an outpatient and initially was started on supportive treatment which did not help her, including gabapentin, Robaxin as well as prednisone.  Given the fever and the neck pain she was directed to the emergency room.  An MRI showed an extensive soft tissue infection within the neck, ventrolateral prevertebral cellulitis/phlegmon. ENT consulted  Subjective / 24h Interval events: Feeling well, less neck pain.  No abdominal pain, no nausea/vomiting  Assessment & Plan: Principal Problem Sepsis due to prevertebral abscess/phlegmon as well as MSSA bacteremia -Patient initially on broad-spectrum antibiotics on admission, transition to Ancef given MSSA bacteremia -ENT following, no plans for surgical intervention right now -Fever curve improving, white count is almost normalized -2D echo 3/21 without vegetations -Due to right lower extremity intermittent weakness underwent an MRI of the lumbar and thoracic spine, no discitis/osteomyelitis seen -Surveillance cultures 3/21 no growth to date, if negative at 48 hours and afebrile may be able to get a PICC line on 3/23  Active Problems Eosinophilic esophagitis -On PPI, tolerating diet -Resumed home budesonide, she mixes it with chocolate syrup (??),  Okay to do that as she does it already at home  Depression -Continue trazodone, Xanax as needed  Hypokalemia -Potassium better  Iron deficiency anemia -Hemoglobin  slightly lower, likely dilutional, continue iron supplements   Scheduled Meds: . budesonide (PULMICORT) nebulizer solution  1 mg Nebulization QHS  . enoxaparin (LOVENOX) injection  40 mg Subcutaneous Q24H  . ferrous sulfate  325 mg Oral Once per day on Mon Wed Fri  . gabapentin  100 mg Oral TID  . pantoprazole  40 mg Oral BID AC  . traZODone  100 mg Oral QHS  . traZODone  50 mg Oral Daily  . vitamin B-12  1,250 mcg Oral Daily   Continuous Infusions: .  ceFAZolin (ANCEF) IV 2 g (02/24/20 0610)   PRN Meds:.acetaminophen **OR** acetaminophen, ALPRAZolam, budesonide, HYDROmorphone (DILAUDID) injection, LORazepam, methocarbamol, ondansetron **OR** ondansetron (ZOFRAN) IV, oxymetazoline  DVT prophylaxis: Lovenox Code Status: Full code Family Communication: d/w patient  Patient admitted from: home  Anticipated d/c place: home Barriers to d/c: Awaiting surveillance cultures to be negative at 48 hours and if afebrile potentially will place a PICC line 3/23 and can go home then  Consultants:  ENT ID  Procedures:  2D echo: pending  Microbiology  Blood cultures 3/19-MSSA 4 out of 4 bottles Blood cultures 2/21-no growth at 24 hours  Antimicrobials: Cefazolin 3/20 >>   Objective: Vitals:   02/23/20 1609 02/23/20 2000 02/23/20 2234 02/24/20 0811  BP: 127/60 (!) 145/93 (!) 143/74 114/64  Pulse: 85 96 91 84  Resp:  18 16 20   Temp: 97.7 F (36.5 C) 98.4 F (36.9 C) 99.8 F (37.7 C) 99.1 F (37.3 C)  TempSrc:  Oral Oral   SpO2: 99% 100% 98% 100%  Weight:      Height:        Intake/Output Summary (Last 24 hours) at 02/24/2020 0954 Last data filed at 02/23/2020 1700 Gross per 24  hour  Intake 4666.63 ml  Output --  Net 4666.63 ml   Filed Weights   02/21/20 0952 02/22/20 0300 02/23/20 0300  Weight: 48.1 kg 51.3 kg 55.3 kg    Examination:  Constitutional: No distress, sitting in chair eating breakfast  Eyes: No scleral icterus ENMT: Moist mucous membranes Neck: normal,  supple Respiratory: Clear to auscultation bilaterally, no wheezing or crackles, normal respiratory effort Cardiovascular: Regular rate and rhythm, no murmurs, no edema Abdomen: Positive bowel sounds Musculoskeletal: no clubbing / cyanosis.  Skin: No rashes seen Neurologic: No focal deficits   Data Reviewed: I have independently reviewed following labs and imaging studies   CBC: Recent Labs  Lab 02/21/20 1058 02/22/20 0216 02/23/20 0529 02/24/20 0512  WBC 20.8* 20.2* 17.8* 11.5*  NEUTROABS 17.8*  --   --   --   HGB 9.7* 9.5* 8.8* 8.0*  HCT 29.6* 29.8* 27.3* 24.8*  MCV 90.5 92.0 90.4 89.9  PLT 263 273 263 [id number]   Basic Metabolic Panel: Recent Labs  Lab 02/21/20 1058 02/22/20 0216 02/23/20 0529 02/24/20 0512  NA 135 136 135 135  K 3.7 3.4* 3.2* 3.5  CL 101 102 103 103  CO2 22 21* 20* 21*  GLUCOSE 117* 103* 98 108*  BUN 15 13 9 8   CREATININE 0.94 0.97 1.01* 0.81  CALCIUM 8.1* 7.9* 7.5* 7.7*   Liver Function Tests: Recent Labs  Lab 02/21/20 1058 02/23/20 0529  AST 22 26  ALT 34 22  ALKPHOS 103 92  BILITOT 0.7 0.8  PROT 6.1* 5.9*  ALBUMIN 2.3* 2.1*   Coagulation Profile: No results for input(s): INR, PROTIME in the last 168 hours. HbA1C: No results for input(s): HGBA1C in the last 72 hours. CBG: No results for input(s): GLUCAP in the last 168 hours.  Recent Results (from the past 240 hour(s))  Culture, blood (routine x 2)     Status: Abnormal (Preliminary result)   Collection Time: 02/21/20 10:58 AM   Specimen: BLOOD  Result Value Ref Range Status   Specimen Description BLOOD BLOOD RIGHT FOREARM  Final   Special Requests   Final    BOTTLES DRAWN AEROBIC AND ANAEROBIC Blood Culture adequate volume   Culture  Setup Time   Final    IN BOTH AEROBIC AND ANAEROBIC BOTTLES GRAM POSITIVE COCCI IN CLUSTERS CRITICAL RESULT CALLED TO, READ BACK BY AND VERIFIED WITH: K AMEND PHARMD 02/22/20 0420 JDW    Culture (A)  Final    STAPHYLOCOCCUS AUREUS REPEATING  SUSCEPTIBILITIES Performed at [facility], [address], [place], [place] [address]    Report Status PENDING  Incomplete  Blood Culture ID Panel (Reflexed)     Status: Abnormal   Collection Time: 02/21/20 10:58 AM  Result Value Ref Range Status   Enterococcus species NOT DETECTED NOT DETECTED Final   Listeria monocytogenes NOT DETECTED NOT DETECTED Final   Staphylococcus species DETECTED (A) NOT DETECTED Final    Comment: CRITICAL RESULT CALLED TO, READ BACK BY AND VERIFIED WITH: K AMEND PHARMD 02/22/20 0420 JDW    Staphylococcus aureus (BCID) DETECTED (A) NOT DETECTED Final    Comment: Methicillin (oxacillin) susceptible Staphylococcus aureus (MSSA). Preferred therapy is anti staphylococcal beta lactam antibiotic (Cefazolin or Nafcillin), unless clinically contraindicated. CRITICAL RESULT CALLED TO, READ BACK BY AND VERIFIED WITH: K AMEND PHARMD 02/22/20 0420 JDW    Methicillin resistance NOT DETECTED NOT DETECTED Final   Streptococcus species NOT DETECTED NOT DETECTED Final   Streptococcus agalactiae NOT DETECTED NOT DETECTED Final  Streptococcus pneumoniae NOT DETECTED NOT DETECTED Final   Streptococcus pyogenes NOT DETECTED NOT DETECTED Final   Acinetobacter baumannii NOT DETECTED NOT DETECTED Final   Enterobacteriaceae species NOT DETECTED NOT DETECTED Final   Enterobacter cloacae complex NOT DETECTED NOT DETECTED Final   Escherichia coli NOT DETECTED NOT DETECTED Final   Klebsiella oxytoca NOT DETECTED NOT DETECTED Final   Klebsiella pneumoniae NOT DETECTED NOT DETECTED Final   Proteus species NOT DETECTED NOT DETECTED Final   Serratia marcescens NOT DETECTED NOT DETECTED Final   Haemophilus influenzae NOT DETECTED NOT DETECTED Final   Neisseria meningitidis NOT DETECTED NOT DETECTED Final   Pseudomonas aeruginosa NOT DETECTED NOT DETECTED Final   Candida albicans NOT DETECTED NOT DETECTED Final   Candida glabrata NOT DETECTED NOT DETECTED Final   Candida krusei NOT  DETECTED NOT DETECTED Final   Candida parapsilosis NOT DETECTED NOT DETECTED Final   Candida tropicalis NOT DETECTED NOT DETECTED Final    Comment: Performed at [facility], [place] [address], [place], [place] [address]  Culture, blood (routine x 2)     Status: Abnormal (Preliminary result)   Collection Time: 02/21/20 11:12 AM   Specimen: BLOOD LEFT ARM  Result Value Ref Range Status   Specimen Description BLOOD LEFT ARM  Final   Special Requests   Final    BOTTLES DRAWN AEROBIC AND ANAEROBIC Blood Culture results may not be optimal due to an inadequate volume of blood received in culture bottles   Culture  Setup Time   Final    IN BOTH AEROBIC AND ANAEROBIC BOTTLES GRAM POSITIVE COCCI IN CLUSTERS CRITICAL VALUE NOTED.  VALUE IS CONSISTENT WITH PREVIOUSLY REPORTED AND CALLED VALUE. Performed at [facility], [place] [address], [place], [place] [address]    Culture STAPHYLOCOCCUS AUREUS (A)  Final   Report Status PENDING  Incomplete  Urine culture     Status: Abnormal   Collection Time: 02/21/20 11:25 AM   Specimen: Urine, Random  Result Value Ref Range Status   Specimen Description URINE, RANDOM  Final   Special Requests   Final    NONE Performed at [facility], [place] [address], [place], [place] [address]    Culture 10,000 COLONIES/mL STAPHYLOCOCCUS AUREUS (A)  Final   Report Status 02/23/2020 FINAL  Final   Organism ID, Bacteria STAPHYLOCOCCUS AUREUS (A)  Final      Susceptibility   Staphylococcus aureus - MIC*    CIPROFLOXACIN <=0.5 SENSITIVE Sensitive     GENTAMICIN <=0.5 SENSITIVE Sensitive     NITROFURANTOIN 32 SENSITIVE Sensitive     OXACILLIN 0.5 SENSITIVE Sensitive     TETRACYCLINE <=1 SENSITIVE Sensitive     VANCOMYCIN <=0.5 SENSITIVE Sensitive     TRIMETH/SULFA <=10 SENSITIVE Sensitive     CLINDAMYCIN <=0.25 SENSITIVE Sensitive     RIFAMPIN <=0.5 SENSITIVE Sensitive     Inducible Clindamycin NEGATIVE Sensitive     * 10,000 COLONIES/mL STAPHYLOCOCCUS  AUREUS  Respiratory Panel by RT PCR (Flu A&B, Covid) - Nasopharyngeal Swab     Status: None   Collection Time: 02/21/20  2:45 PM   Specimen: Nasopharyngeal Swab  Result Value Ref Range Status   SARS Coronavirus 2 by RT PCR NEGATIVE NEGATIVE Final    Comment: (NOTE) SARS-CoV-2 target nucleic acids are NOT DETECTED. The SARS-CoV-2 RNA is generally detectable in upper respiratoy specimens during the acute phase of infection. The lowest concentration of SARS-CoV-2 viral copies this assay can detect is 131 copies/mL. A negative  result does not preclude SARS-Cov-2 infection and should not be used as the sole basis for treatment or other patient management decisions. A negative result may occur with  improper specimen collection/handling, submission of specimen other than nasopharyngeal swab, presence of viral mutation(s) within the areas targeted by this assay, and inadequate number of viral copies (<131 copies/mL). A negative result must be combined with clinical observations, patient history, and epidemiological information. The expected result is Negative. Fact Sheet for Patients:  PinkCheek.be Fact Sheet for Healthcare Providers:  GravelBags.it This test is not yet ap proved or cleared by the [place] FDA and  has been authorized for detection and/or diagnosis of SARS-CoV-2 by FDA under an Emergency Use Authorization (EUA). This EUA will remain  in effect (meaning this test can be used) for the duration of the COVID-19 declaration under Section 564(b)(1) of the Act, 21 U.S.C. section 360bbb-3(b)(1), unless the authorization is terminated or revoked sooner.    Influenza A by PCR NEGATIVE NEGATIVE Final   Influenza B by PCR NEGATIVE NEGATIVE Final    Comment: (NOTE) The Xpert Xpress SARS-CoV-2/FLU/RSV assay is intended as an aid in  the diagnosis of influenza from Nasopharyngeal swab specimens and  should not be used as a  sole basis for treatment. Nasal washings and  aspirates are unacceptable for Xpert Xpress SARS-CoV-2/FLU/RSV  testing. Fact Sheet for Patients: PinkCheek.be Fact Sheet for Healthcare Providers: GravelBags.it This test is not yet approved or cleared by the [place] FDA and  has been authorized for detection and/or diagnosis of SARS-CoV-2 by  FDA under an Emergency Use Authorization (EUA). This EUA will remain  in effect (meaning this test can be used) for the duration of the  Covid-19 declaration under Section 564(b)(1) of the Act, 21  U.S.C. section 360bbb-3(b)(1), unless the authorization is  terminated or revoked. Performed at [facility], [place] [address] [place],  [address]      Radiology Studies: ECHOCARDIOGRAM COMPLETE  Result Date: 02/23/2020    ECHOCARDIOGRAM REPORT   Patient Name:   EMERCYN PARFITT Date of Exam: 02/23/2020 Medical Rec #:  [id number]      Height:       62.0 in Accession #:    [id number]     Weight:       121.9 lb Date of Birth:  1950/12/23      BSA:          1.549 m Patient Age:    53 years       BP:           127/64 mmHg Patient Gender: F              HR:           97 bpm. Exam Location:  Inpatient Procedure: 2D Echo, Cardiac Doppler and Color Doppler Indications:    Bacteremia 790.7 / R78.81  History:        Patient has no prior history of Echocardiogram examinations.                 Sepsis (HCC),Cellulitis and abscess of neck.  Sonographer:    Alvino Chapel RCS Referring Phys: [place]  1. Left ventricular ejection fraction, by estimation, is 60 to 65%. The left ventricle has normal function. The left ventricle has no regional wall motion abnormalities. Left ventricular diastolic parameters are indeterminate.  2. Right ventricular systolic function is normal. The right ventricular size is normal.  3. Left atrial size  was mildly dilated.  4. The mitral valve is normal in  structure. No evidence of mitral valve regurgitation. No evidence of mitral stenosis.  5. The aortic valve is tricuspid. Aortic valve regurgitation is not visualized. Mild aortic valve sclerosis is present, with no evidence of aortic valve stenosis.  6. The inferior vena cava is dilated in size with <50% respiratory variability, suggesting right atrial pressure of 15 mmHg. Conclusion(s)/Recommendation(s): No evidence of valvular vegetations on this transthoracic echocardiogram. Would recommend a transesophageal echocardiogram to exclude infective endocarditis if clinically indicated. FINDINGS  Left Ventricle: Left ventricular ejection fraction, by estimation, is 60 to 65%. The left ventricle has normal function. The left ventricle has no regional wall motion abnormalities. The left ventricular internal cavity size was normal in size. There is  no left ventricular hypertrophy. Left ventricular diastolic parameters are indeterminate. Right Ventricle: The right ventricular size is normal. No increase in right ventricular wall thickness. Right ventricular systolic function is normal. Left Atrium: Left atrial size was mildly dilated. Right Atrium: Right atrial size was normal in size. Pericardium: There is no evidence of pericardial effusion. Mitral Valve: The mitral valve is normal in structure. Normal mobility of the mitral valve leaflets. No evidence of mitral valve regurgitation. No evidence of mitral valve stenosis. Tricuspid Valve: The tricuspid valve is normal in structure. Tricuspid valve regurgitation is not demonstrated. No evidence of tricuspid stenosis. Aortic Valve: The aortic valve is tricuspid. Aortic valve regurgitation is not visualized. Mild aortic valve sclerosis is present, with no evidence of aortic valve stenosis. Pulmonic Valve: The pulmonic valve was normal in structure. Pulmonic valve regurgitation is not visualized. No evidence of pulmonic stenosis. Aorta: The aortic root is normal in size and  structure. Venous: The inferior vena cava is dilated in size with less than 50% respiratory variability, suggesting right atrial pressure of 15 mmHg. IAS/Shunts: No atrial level shunt detected by color flow Doppler.  LEFT VENTRICLE PLAX 2D LVIDd:         4.55 cm  Diastology LVIDs:         2.85 cm  LV e' lateral:   14.10 cm/s LV PW:         1.10 cm  LV E/e' lateral: 6.7 LV IVS:        1.00 cm  LV e' medial:    10.90 cm/s LVOT diam:     1.70 cm  LV E/e' medial:  8.6 LV SV:         53 LV SV Index:   34 LVOT Area:     2.27 cm  RIGHT VENTRICLE RV S prime:     16.90 cm/s TAPSE (M-mode): 2.0 cm LEFT ATRIUM             Index       RIGHT ATRIUM           Index LA diam:        2.80 cm 1.81 cm/m  RA Area:     14.10 cm LA Vol (A2C):   64.4 ml 41.58 ml/m RA Volume:   32.40 ml  20.92 ml/m LA Vol (A4C):   40.7 ml 26.24 ml/m LA Biplane Vol: 51.4 ml 33.18 ml/m  AORTIC VALVE LVOT Vmax:   116.00 cm/s LVOT Vmean:  83.900 cm/s LVOT VTI:    0.233 m  AORTA Ao Root diam: 2.90 cm MITRAL VALVE MV Area (PHT): 3.27 cm    SHUNTS MV Decel Time: 232 msec    Systemic VTI:  0.23 m MV  E velocity: 93.80 cm/s  Systemic Diam: 1.70 cm MV A velocity: 79.30 cm/s MV E/A ratio:  1.18 Candee Furbish MD Electronically signed by Candee Furbish MD Signature Date/Time: 02/23/2020/3:23:17 PM    Final    Marzetta Board, MD, PhD [facility]  Between 7 am - 7 pm I am available, please contact me via Amion or Securechat  Between 7 pm - 7 am I am not available, please contact night coverage MD/APP via Amion

## 2020-02-24 NOTE — Care Management Important Message (Signed)
Important Message  Patient Details  Name: Megan Cox MRN: [id number] Date of Birth: 11-27-1951   Medicare Important Message Given:  Yes     Orbie Pyo 02/24/2020, 12:06 PM

## 2020-02-24 NOTE — Clinical Social Work Note (Signed)
Plan is for dc tomorrow with IV abx and home health. CSW met with patient at the bedside to discuss. Patient requested a list of home health agencies to review and compare. CSW provided this list. Plan is for CSW follow up with patient to get choice once she has reviewed.

## 2020-02-24 NOTE — Progress Notes (Signed)
Occupational Therapy Treatment Patient Details Name: Megan Cox MRN: [id number] DOB: 06-13-1951 Today's Date: 02/24/2020    History of present illness Pt is a 69 y.o. female with medical history significant of depression/anxiety, eosinophilic esophagitis, arthritis presents to emergency department due to fever, chills, neck, bilateral shoulder and bilateral hip pain.    OT comments  Pt is progressing well. She is completing self care independently and ambulating to the bathroom without assist. Pt fatigues easily. Educated in energy conservation strategies and reinforced with handout. Pt's sister has a 3 in 1 pt can borrow for seated showering. Pt with concerns about maintaining proper nutrition as she will be on long term antibiotics, left message for MD regarding pt's request for dietician referral.   Follow Up Recommendations  No OT follow up    Equipment Recommendations  None recommended by OT    Recommendations for Other Services      Precautions / Restrictions         Mobility Bed Mobility               General bed mobility comments: Patient up in chair  Transfers Overall transfer level: Modified independent                    Balance     Sitting balance-Leahy Scale: Normal       Standing balance-Leahy Scale: Fair                             ADL either performed or assessed with clinical judgement   ADL Overall ADL's : Modified independent                                       General ADL Comments: Pt has been routinely performing self care and taking herself to the bathroom. Eductated at length in energy conservation strategies.     Vision       Perception     Praxis      Cognition Arousal/Alertness: Awake/alert Behavior During Therapy: WFL for tasks assessed/performed Overall Cognitive Status: Within Functional Limits for tasks assessed                                           Exercises     Shoulder Instructions       General Comments      Pertinent Vitals/ Pain       Pain Assessment: Faces Faces Pain Scale: Hurts little more Pain Location: thighs Pain Descriptors / Indicators: Discomfort Pain Intervention(s): Heat applied  Home Living                                          Prior Functioning/Environment              Frequency  Min 2X/week        Progress Toward Goals  OT Goals(current goals can now be found in the care plan section)  Progress towards OT goals: Progressing toward goals  Acute Rehab OT Goals Patient Stated Goal: to shower at home OT Goal Formulation: With patient Time For Goal Achievement: 03/07/20 Potential to Achieve Goals: Good  Plan  Discharge plan remains appropriate    Co-evaluation                 AM-PAC OT "6 Clicks" Daily Activity     Outcome Measure   Help from another person eating meals?: None Help from another person taking care of personal grooming?: None Help from another person toileting, which includes using toliet, bedpan, or urinal?: None Help from another person bathing (including washing, rinsing, drying)?: None Help from another person to put on and taking off regular upper body clothing?: None Help from another person to put on and taking off regular lower body clothing?: None 6 Click Score: 24    End of Session    OT Visit Diagnosis: Muscle weakness (generalized) (M62.81);Pain   Activity Tolerance Patient tolerated treatment well   Patient Left in chair;with call bell/phone within reach   Nurse Communication          Time: [id number] OT Time Calculation (min): 45 min  Charges: OT General Charges $OT Visit: 1 Visit OT Treatments $Self Care/Home Management : 38-52 mins  Megan Cox, OTR/L Acute Rehabilitation Services Pager: [phone] Office: [phone]   Megan Cox 02/24/2020, 11:39 AM

## 2020-02-24 NOTE — Progress Notes (Signed)
Brief Nutrition Note  Received MD consult for diet education at patient's request. Patient is concerned that she will have GI side effects from IV antibiotics that she will be receiving for the next 1-2 months at home. Discussed good options for probiotics and encouraged patient to eat small, frequent meals and snacks. We discussed taking PO supplements such as Ensure or Carnation instant breakfast to help maximize intake if she feels like she is not eating enough or if she starts losing weight. Patient was very appreciative of RD visit. No further interventions indicated at this time. Please re-consult if needed.  Molli Barrows, RD, LDN, CNSC Please refer to [facility] for contact information.

## 2020-02-25 ENCOUNTER — Inpatient Hospital Stay: Payer: Self-pay

## 2020-02-25 DIAGNOSIS — Z95828 Presence of other vascular implants and grafts: Secondary | ICD-10-CM

## 2020-02-25 LAB — PREPARE RBC (CROSSMATCH)

## 2020-02-25 LAB — BASIC METABOLIC PANEL
Anion gap: 8 (ref 5–15)
BUN: 9 mg/dL (ref 8–23)
CO2: 20 mmol/L — ABNORMAL LOW (ref 22–32)
Calcium: 7.7 mg/dL — ABNORMAL LOW (ref 8.9–10.3)
Chloride: 108 mmol/L (ref 98–111)
Creatinine, Ser: 0.74 mg/dL (ref 0.44–1.00)
GFR calc Af Amer: >60 mL/min (ref >60)
GFR calc non Af Amer: >60 mL/min (ref >60)
Glucose, Bld: 113 mg/dL — ABNORMAL HIGH (ref 70–99)
Potassium: 3.7 mmol/L (ref 3.5–5.1)
Sodium: 136 mmol/L (ref 135–145)

## 2020-02-25 LAB — CULTURE, BLOOD (ROUTINE X 2): Special Requests: ADEQUATE

## 2020-02-25 LAB — CBC
HCT: 22.4 % — ABNORMAL LOW (ref 36.0–46.0)
Hemoglobin: 7.2 g/dL — ABNORMAL LOW (ref 12.0–15.0)
MCH: 28.8 pg (ref 26.0–34.0)
MCHC: 32.1 g/dL (ref 30.0–36.0)
MCV: 89.6 fL (ref 80.0–100.0)
Platelets: 324 10*3/uL (ref 150–400)
RBC: 2.5 MIL/uL — ABNORMAL LOW (ref 3.87–5.11)
RDW: 14.4 % (ref 11.5–15.5)
WBC: 7.9 10*3/uL (ref 4.0–10.5)
nRBC: 0 % (ref 0.0–0.2)

## 2020-02-25 LAB — ABO/RH: ABO/RH(D): A POS

## 2020-02-25 MED ORDER — CEFAZOLIN IV (FOR PTA / DISCHARGE USE ONLY): 2.0000 g | Freq: Three times a day (TID) | INTRAVENOUS | 0 refills | Status: DC

## 2020-02-25 MED ORDER — SODIUM CHLORIDE 0.9% FLUSH: 10.0000 mL | INTRAVENOUS | Status: DC | PRN

## 2020-02-25 MED ORDER — SODIUM CHLORIDE 0.9% FLUSH: 10.0000 mL | Freq: Two times a day (BID) | INTRAVENOUS | Status: DC

## 2020-02-25 MED ORDER — SODIUM CHLORIDE 0.9% IV SOLUTION: Freq: Once | INTRAVENOUS | Status: AC

## 2020-02-25 MED ORDER — CHLORHEXIDINE GLUCONATE CLOTH 2 % EX PADS
6.0000 | MEDICATED_PAD | Freq: Every day | CUTANEOUS | Status: DC
  Administered 2020-02-25: 6 via TOPICAL

## 2020-02-25 MED ORDER — HEPARIN SOD (PORK) LOCK FLUSH 100 UNIT/ML IV SOLN
250.0000 [IU] | INTRAVENOUS | Status: AC | PRN
  Administered 2020-02-25: 250 [IU]
  Filled 2020-02-25: qty 2.5

## 2020-02-25 MED FILL — Budesonide Inhalation Susp 0.5 MG/2ML: RESPIRATORY_TRACT | Qty: 4 | Status: AC

## 2020-02-25 NOTE — Progress Notes (Signed)
Peripherally Inserted Central Catheter Placement  The IV Nurse has discussed with the patient and/or persons authorized to consent for the patient, the purpose of this procedure and the potential benefits and risks involved with this procedure.  The benefits include less needle sticks, lab draws from the catheter, and the patient may be discharged home with the catheter. Risks include, but not limited to, infection, bleeding, blood clot (thrombus formation), and puncture of an artery; nerve damage and irregular heartbeat and possibility to perform a PICC exchange if needed/ordered by physician.  Alternatives to this procedure were also discussed.  Bard Power PICC patient education guide, fact sheet on infection prevention and patient information card has been provided to patient /or left at bedside.    PICC Placement Documentation  PICC Single Lumen [id number] PICC Right Basilic 35 cm 0 cm (Active)  Indication for Insertion or Continuance of Line Home intravenous therapies (PICC only) 02/25/20 1211  Exposed Catheter (cm) 0 cm 02/25/20 1211  Site Assessment Dry;Clean;Intact 02/25/20 1211  Line Status Flushed;Saline locked;Blood return noted 02/25/20 1211  Dressing Type Transparent 02/25/20 1211  Dressing Status Clean;Dry;Intact;Antimicrobial disc in place 02/25/20 1211  Dressing Change Due 03/03/20 02/25/20 1211       [name] 02/25/2020, 12:24 PM

## 2020-02-25 NOTE — TOC Transition Note (Signed)
Transition of Care [facility]) - CM/SW Discharge Note   Patient Details  Name: Megan Cox MRN: [id number] Date of Birth: [birth year]  Transition of Care [facility]) CM/SW Contact:  Atilano Median, LCSW Phone Number: 02/25/2020, 3:44 PM   Clinical Narrative:     Discharged home with home health services. Patient aware and agreeable to this plan. Carolynn Sayers provided education related to IV abx. Family will transport. No other needs at this time. Case closed to this CSW.   Final next level of care: [place] Barriers to Discharge: Barriers Resolved   Patient Goals and CMS Choice Patient states their goals for this hospitalization and ongoing recovery are:: to go home CMS Medicare.gov Compare Post Acute Care list provided to:: Patient Choice offered to / list presented to : Patient  Discharge Placement                       Discharge Plan and Services   Discharge Planning Services: CM Consult                      HH Arranged: RN, PT, OT [facility] Agency: [place] Date [place]: 02/22/20 Time [place]: [id number] Representative spoke with at [place]: [place] ([place]) Interventions     Readmission Risk Interventions No flowsheet data found.

## 2020-02-25 NOTE — Progress Notes (Signed)
Patient ID: Megan Cox, female   DOB: 10-31-1951, 69 y.o.   MRN: [id number]         [facility] for Infectious Disease  Date of Admission:  02/21/2020           Day 5 cefazolin ASSESSMENT: She has MSSA bacteremia complicated by smoldering cervical spine infection.  Repeat blood cultures are negative at 48 hours.  A PICC has been placed.  PLAN: 1. Continue cefazolin for 6 weeks total 2. Discharge home later today 3. I will sign off now  Diagnosis: Cervical spine infection and bacteremia  Culture Result: MSSA  Allergies  Allergen Reactions  . Contrast Media [Iodinated Diagnostic Agents] Anaphylaxis, Shortness Of Breath and Swelling    PATIENT STATES THAT HER BROTHER HAD ANAPHYLAXIS WITH IV CONTRAST AND WANTS TO SHARE THIS FAMILY HISTORY. Brother died from reaction    OPAT Orders Discharge antibiotics: Per pharmacy protocol cefazolin  Duration: 6 weeks End Date: 04/03/2020  [facility] Care Per Protocol:  Home health RN for IV administration and teaching; PICC line care and labs.    Labs weekly while on IV antibiotics: _x_ CBC with differential _x_ BMP __ CMP _x_ CRP _x_ ESR __ Vancomycin trough __ CK  __ Please pull PIC at completion of IV antibiotics __ Please leave PIC in place until doctor has seen patient or been notified  Fax weekly labs to [phone]  Clinic Follow Up Appt: 04/01/2020  Principal Problem:   Bacteremia due to methicillin susceptible Staphylococcus aureus (MSSA) Active Problems:   Abscess in epidural space of cervical spine   Depression   Sepsis ([place])   Anemia   Eosinophilic esophagitis   Scheduled Meds: . Budesonide 0.5 mg/2 ml inhalation suspension  1 mg Oral QHS  . Chlorhexidine Gluconate Cloth  6 each Topical Daily  . enoxaparin (LOVENOX) injection  40 mg Subcutaneous Q24H  . ferrous sulfate  325 mg Oral Once per day on Mon Wed Fri  . gabapentin  100 mg Oral TID  . pantoprazole  40 mg Oral BID AC  . sodium chloride  flush  10-40 mL Intracatheter Q12H  . traZODone  100 mg Oral QHS  . traZODone  50 mg Oral Daily  . vitamin B-12  1,250 mcg Oral Daily   Continuous Infusions: .  ceFAZolin (ANCEF) IV 2 g (02/25/20 0631)   PRN Meds:.acetaminophen **OR** acetaminophen, ALPRAZolam, HYDROmorphone (DILAUDID) injection, LORazepam, methocarbamol, ondansetron **OR** ondansetron (ZOFRAN) IV, oxymetazoline, sodium chloride flush   SUBJECTIVE: She tells me that she is anxious by nature and wants to make sure that she understands how to give herself IV cefazolin at home so she will "do it perfectly".  She did not sleep well last night because of interruptions.  She is eager to go home.  Review of Systems: Review of Systems  Constitutional: Negative for chills, diaphoresis and fever.  Gastrointestinal: Negative for abdominal pain, diarrhea, nausea and vomiting.  Musculoskeletal: Positive for back pain, joint pain and neck pain.    Allergies  Allergen Reactions  . Contrast Media [Iodinated Diagnostic Agents] Anaphylaxis, Shortness Of Breath and Swelling    PATIENT STATES THAT HER BROTHER HAD ANAPHYLAXIS WITH IV CONTRAST AND WANTS TO SHARE THIS FAMILY HISTORY. Brother died from reaction    OBJECTIVE: Vitals:   02/24/20 2321 02/25/20 0720 02/25/20 1216 02/25/20 1302  BP: (!) 108/52 (!) 110/56 (!) 111/59 (!) 109/52  Pulse: 73 68 76 66  Resp:  18 16 16   Temp: 99.5 F (37.5 C) 98.9 F (  37.2 C) 98.9 F (37.2 C) 98.5 F (36.9 C)  TempSrc: Oral  Oral Axillary  SpO2: 93% 95% 96% 96%  Weight:      Height:       Body mass index is 22.3 kg/m.  Physical Exam Constitutional:      Comments: She is in good spirits.  Cardiovascular:     Rate and Rhythm: Normal rate and regular rhythm.     Heart sounds: No murmur.  Pulmonary:     Effort: Pulmonary effort is normal.     Breath sounds: Normal breath sounds.     Lab Results Lab Results  Component Value Date   WBC 7.9 02/25/2020   HGB 7.2 (L) 02/25/2020    HCT 22.4 (L) 02/25/2020   MCV 89.6 02/25/2020   PLT 324 02/25/2020    Lab Results  Component Value Date   CREATININE 0.74 02/25/2020   BUN 9 02/25/2020   NA 136 02/25/2020   K 3.7 02/25/2020   CL 108 02/25/2020   CO2 20 (L) 02/25/2020    Lab Results  Component Value Date   ALT 22 02/23/2020   AST 26 02/23/2020   ALKPHOS 92 02/23/2020   BILITOT 0.8 02/23/2020     Microbiology: Recent Results (from the past 240 hour(s))  Culture, blood (routine x 2)     Status: Abnormal   Collection Time: 02/21/20 10:58 AM   Specimen: BLOOD  Result Value Ref Range Status   Specimen Description BLOOD BLOOD RIGHT FOREARM  Final   Special Requests   Final    BOTTLES DRAWN AEROBIC AND ANAEROBIC Blood Culture adequate volume   Culture  Setup Time   Final    IN BOTH AEROBIC AND ANAEROBIC BOTTLES GRAM POSITIVE COCCI IN CLUSTERS CRITICAL RESULT CALLED TO, READ BACK BY AND VERIFIED WITH: K AMEND [facility] 02/22/20 0420 JDW Performed at [facility], [place] [address], [place], [place] [address]    Culture STAPHYLOCOCCUS AUREUS (A)  Final   Report Status 02/25/2020 FINAL  Final   Organism ID, Bacteria STAPHYLOCOCCUS AUREUS  Final      Susceptibility   Staphylococcus aureus - MIC*    CIPROFLOXACIN <=0.5 SENSITIVE Sensitive     ERYTHROMYCIN <=0.25 SENSITIVE Sensitive     GENTAMICIN <=0.5 SENSITIVE Sensitive     OXACILLIN 0.5 SENSITIVE Sensitive     TETRACYCLINE <=1 SENSITIVE Sensitive     VANCOMYCIN <=0.5 SENSITIVE Sensitive     TRIMETH/SULFA <=10 SENSITIVE Sensitive     CLINDAMYCIN <=0.25 SENSITIVE Sensitive     RIFAMPIN <=0.5 SENSITIVE Sensitive     Inducible Clindamycin NEGATIVE Sensitive     * STAPHYLOCOCCUS AUREUS  Blood Culture ID Panel (Reflexed)     Status: Abnormal   Collection Time: 02/21/20 10:58 AM  Result Value Ref Range Status   Enterococcus species NOT DETECTED NOT DETECTED Final   Listeria monocytogenes NOT DETECTED NOT DETECTED Final   Staphylococcus species DETECTED (A) NOT  DETECTED Final    Comment: CRITICAL RESULT CALLED TO, READ BACK BY AND VERIFIED WITH: K AMEND PHARMD 02/22/20 0420 JDW    Staphylococcus aureus (BCID) DETECTED (A) NOT DETECTED Final    Comment: Methicillin (oxacillin) susceptible Staphylococcus aureus (MSSA). Preferred therapy is anti staphylococcal beta lactam antibiotic (Cefazolin or Nafcillin), unless clinically contraindicated. CRITICAL RESULT CALLED TO, READ BACK BY AND VERIFIED WITH: K AMEND PHARMD 02/22/20 0420 JDW    Methicillin resistance NOT DETECTED NOT DETECTED Final   Streptococcus species NOT DETECTED NOT DETECTED Final   Streptococcus  agalactiae NOT DETECTED NOT DETECTED Final   Streptococcus pneumoniae NOT DETECTED NOT DETECTED Final   Streptococcus pyogenes NOT DETECTED NOT DETECTED Final   Acinetobacter baumannii NOT DETECTED NOT DETECTED Final   Enterobacteriaceae species NOT DETECTED NOT DETECTED Final   Enterobacter cloacae complex NOT DETECTED NOT DETECTED Final   Escherichia coli NOT DETECTED NOT DETECTED Final   Klebsiella oxytoca NOT DETECTED NOT DETECTED Final   Klebsiella pneumoniae NOT DETECTED NOT DETECTED Final   Proteus species NOT DETECTED NOT DETECTED Final   Serratia marcescens NOT DETECTED NOT DETECTED Final   Haemophilus influenzae NOT DETECTED NOT DETECTED Final   Neisseria meningitidis NOT DETECTED NOT DETECTED Final   Pseudomonas aeruginosa NOT DETECTED NOT DETECTED Final   Candida albicans NOT DETECTED NOT DETECTED Final   Candida glabrata NOT DETECTED NOT DETECTED Final   Candida krusei NOT DETECTED NOT DETECTED Final   Candida parapsilosis NOT DETECTED NOT DETECTED Final   Candida tropicalis NOT DETECTED NOT DETECTED Final    Comment: Performed at [facility], [place] [address], [place], [place] [address]  Culture, blood (routine x 2)     Status: Abnormal   Collection Time: 02/21/20 11:12 AM   Specimen: BLOOD LEFT ARM  Result Value Ref Range Status   Specimen Description BLOOD LEFT ARM   Final   Special Requests   Final    BOTTLES DRAWN AEROBIC AND ANAEROBIC Blood Culture results may not be optimal due to an inadequate volume of blood received in culture bottles   Culture  Setup Time   Final    IN BOTH AEROBIC AND ANAEROBIC BOTTLES GRAM POSITIVE COCCI IN CLUSTERS CRITICAL VALUE NOTED.  VALUE IS CONSISTENT WITH PREVIOUSLY REPORTED AND CALLED VALUE.    Culture (A)  Final    STAPHYLOCOCCUS AUREUS SUSCEPTIBILITIES PERFORMED ON PREVIOUS CULTURE WITHIN THE LAST 5 DAYS. Performed at [facility], [place] [address] [place], [place] [address]    Report Status 02/25/2020 FINAL  Final  Urine culture     Status: Abnormal   Collection Time: 02/21/20 11:25 AM   Specimen: Urine, Random  Result Value Ref Range Status   Specimen Description URINE, RANDOM  Final   Special Requests   Final    NONE Performed at [facility], [place] [address], [place], [place] [address]    Culture 10,000 COLONIES/mL STAPHYLOCOCCUS AUREUS (A)  Final   Report Status 02/23/2020 FINAL  Final   Organism ID, Bacteria STAPHYLOCOCCUS AUREUS (A)  Final      Susceptibility   Staphylococcus aureus - MIC*    CIPROFLOXACIN <=0.5 SENSITIVE Sensitive     GENTAMICIN <=0.5 SENSITIVE Sensitive     NITROFURANTOIN 32 SENSITIVE Sensitive     OXACILLIN 0.5 SENSITIVE Sensitive     TETRACYCLINE <=1 SENSITIVE Sensitive     VANCOMYCIN <=0.5 SENSITIVE Sensitive     TRIMETH/SULFA <=10 SENSITIVE Sensitive     CLINDAMYCIN <=0.25 SENSITIVE Sensitive     RIFAMPIN <=0.5 SENSITIVE Sensitive     Inducible Clindamycin NEGATIVE Sensitive     * 10,000 COLONIES/mL STAPHYLOCOCCUS AUREUS  Respiratory Panel by RT PCR (Flu A&B, Covid) - Nasopharyngeal Swab     Status: None   Collection Time: 02/21/20  2:45 PM   Specimen: Nasopharyngeal Swab  Result Value Ref Range Status   SARS Coronavirus 2 by RT PCR NEGATIVE NEGATIVE Final    Comment: (NOTE) SARS-CoV-2 target nucleic acids are NOT DETECTED. The SARS-CoV-2 RNA is generally  detectable in upper respiratoy specimens during  the acute phase of infection. The lowest concentration of SARS-CoV-2 viral copies this assay can detect is 131 copies/mL. A negative result does not preclude SARS-Cov-2 infection and should not be used as the sole basis for treatment or other patient management decisions. A negative result may occur with  improper specimen collection/handling, submission of specimen other than nasopharyngeal swab, presence of viral mutation(s) within the areas targeted by this assay, and inadequate number of viral copies (<131 copies/mL). A negative result must be combined with clinical observations, patient history, and epidemiological information. The expected result is Negative. Fact Sheet for Patients:  PinkCheek.be Fact Sheet for Healthcare Providers:  GravelBags.it This test is not yet ap proved or cleared by the [place] FDA and  has been authorized for detection and/or diagnosis of SARS-CoV-2 by FDA under an Emergency Use Authorization (EUA). This EUA will remain  in effect (meaning this test can be used) for the duration of the COVID-19 declaration under Section 564(b)(1) of the Act, 21 U.S.C. section 360bbb-3(b)(1), unless the authorization is terminated or revoked sooner.    Influenza A by PCR NEGATIVE NEGATIVE Final   Influenza B by PCR NEGATIVE NEGATIVE Final    Comment: (NOTE) The Xpert Xpress SARS-CoV-2/FLU/RSV assay is intended as an aid in  the diagnosis of influenza from Nasopharyngeal swab specimens and  should not be used as a sole basis for treatment. Nasal washings and  aspirates are unacceptable for Xpert Xpress SARS-CoV-2/FLU/RSV  testing. Fact Sheet for Patients: PinkCheek.be Fact Sheet for Healthcare Providers: GravelBags.it This test is not yet approved or cleared by the [place] FDA and  has been  authorized for detection and/or diagnosis of SARS-CoV-2 by  FDA under an Emergency Use Authorization (EUA). This EUA will remain  in effect (meaning this test can be used) for the duration of the  Covid-19 declaration under Section 564(b)(1) of the Act, 21  U.S.C. section 360bbb-3(b)(1), unless the authorization is  terminated or revoked. Performed at [facility], [place] [address] [place], [place] [address]   Culture, blood (routine x 2)     Status: None (Preliminary result)   Collection Time: 02/23/20  5:35 AM   Specimen: BLOOD LEFT HAND  Result Value Ref Range Status   Specimen Description BLOOD LEFT HAND  Final   Special Requests   Final    BOTTLES DRAWN AEROBIC AND ANAEROBIC Blood Culture results may not be optimal due to an inadequate volume of blood received in culture bottles   Culture NO GROWTH 2 DAYS  Final   Report Status PENDING  Incomplete  Culture, blood (routine x 2)     Status: None (Preliminary result)   Collection Time: 02/23/20  5:40 AM   Specimen: BLOOD RIGHT ARM  Result Value Ref Range Status   Specimen Description BLOOD RIGHT ARM  Final   Special Requests   Final    BOTTLES DRAWN AEROBIC AND ANAEROBIC Blood Culture adequate volume   Culture NO GROWTH 2 DAYS  Final   Report Status PENDING  Incomplete    Michel Bickers, MD [facility] [place] [facility] [phone] pager   [phone] cell 02/25/2020, 1:26 PM

## 2020-02-25 NOTE — Discharge Summary (Signed)
Physician Discharge Summary  Megan Cox YPP:[id number] DOB: May 18, 1951 DOA: 02/21/2020  PCP: Lavone Orn, MD  Admit date: 02/21/2020 Discharge date: 02/25/2020  Admitted From: Home Disposition: Home  Recommendations for Outpatient Follow-up:  1. Follow up with PCP in 1-2 weeks  Home Health: RN Equipment/Devices: PICC line  Discharge Condition: Stable CODE STATUS: Full code Diet recommendation: Regular  HPI: Per admitting MD,  Megan Cox is a 69 y.o. female with medical history significant of depression/anxiety, eosinophilic esophagitis, arthritis presents to emergency department due to fever, chills, neck, bilateral shoulder and bilateral hip pain since 2 days.  Patient tells me that her symptoms started since  2 weeks and fever started since couple of days. She was evaluated by orthopedic surgery outpatient and was initially started on gabapentin, methocarbamol which didn't helped her. She was started on prednisone last week however since 2 days she is running high-grade fever of 102.8 associated with chills. This morning she had severe neck pain which radiates to her shoulders, upper arms and upper legs associated with weakness in upper arms and upper legs. She said that she couldn't sleep, can't get up and go in the morning so she decided to come to the emergency department for further evaluation and management. No history of fall, headache, blurry vision, urinary, bowel incontinence. No chest pain, shortness of breath, palpitation, leg swelling, dysuria, decreased appetite, diarrhea or constipation. She lives alone and denies smoking, alcohol, is a drug use.  She takes naproxen and Tylenol extra strength for pain at home.  No history of melena.  She had upper GI endoscopy on 07/17/2018 which showed eosinophilic esophagitis and esophageal stenosis.  Hospital Course / Discharge diagnoses: Principal Problem Sepsis due to prevertebral abscess/phlegmon as well as MSSA bacteremia  -Patient initially on broad-spectrum antibiotics on admission, blood cultures speciated MSSA and she was transitioned to Ancef. ENT and ID were consulted and followed patient while hospitalized. She is improving with antibiotics alone, her leukocytosis has resolved, improving and will be discharged home in stable condition with 6 weeks of IV Ancef with last date 04/03/20. Patient did not require surgical intervention. Her TTE was without vegetations. Surveillance cultures were negative and underwent a PICC line placement on 02/25/20 prior to discharge.   Active Problems Eosinophilic esophagitis-On PPI, tolerating diet\ Depression -Continue trazodone, Xanax as needed Hypokalemia -Potassium normalized  Iron deficiency anemia / anemia in the setting of acute illness - patient's hemoglobin slowly drifted down to 7.2, likely multifactorial due to sepsis, dilutional component. She has no evidence of bleeding, was transfused 1U pRBC prior to discharge. She is to continue iron supplementations.   Discharge Instructions  Discharge Instructions    Home infusion instructions   Complete by: As directed    Instructions: Flushing of vascular access device: 0.9% NaCl pre/post medication administration and prn patency; Heparin 100 u/ml, 70m for implanted ports and Heparin 10u/ml, 581mfor all other central venous catheters.     Allergies as of 02/25/2020      Reactions   Contrast Media [iodinated Diagnostic Agents] Anaphylaxis, Shortness Of Breath, Swelling   PATIENT STATES THAT HER BROTHER HAD ANAPHYLAXIS WITH IV CONTRAST AND WANTS TO SHARE THIS FAMILY HISTORY. Brother died from reaction      Medication List    TAKE these medications   acetaminophen 650 MG CR tablet Commonly known as: TYLENOL Take 1,300 mg by mouth every 8 (eight) hours as needed for pain or fever.   ALPRAZolam 0.25 MG tablet Commonly known as: XADuanne Moron  Take 0.25 mg by mouth 2 (two) times daily as needed for anxiety or sleep.   Blink  Tears 0.25 % Soln Generic drug: Polyethylene Glycol 400 Place 1 drop into both eyes 3 (three) times daily as needed (for dry eyes, when not using Systane).   budesonide 0.5 MG/2ML nebulizer solution Commonly known as: PULMICORT Take 1 mg by nebulization See admin instructions. Mix 1 mg (2 ampules) into 5 ml's of sugar-free simple syrup and drink as needed for symptoms of Eosinophilic esophagitis   ceFAZolin  IVPB Commonly known as: ANCEF Inject 2 g into the vein every 8 (eight) hours. Indication: MSSA bacteremia Last Day of Therapy: 04/03/2020 Labs - Once weekly:  CBC/D and BMP, Labs - Every other week:  ESR and CRP   diphenhydrAMINE 25 MG tablet Commonly known as: BENADRYL Take 25 mg by mouth every 6 (six) hours as needed for itching.   gabapentin 100 MG capsule Commonly known as: NEURONTIN Take 100 mg by mouth 3 (three) times daily.   Ginkgo Biloba 120 MG Tabs Take 120 mg by mouth daily.   Glucosamine Chond MSM Formula Tabs Take 1 tablet by mouth in the morning and at bedtime.   High Potency Iron 65 MG Tabs Take 65 mg by mouth 3 (three) times a week.   methocarbamol 500 MG tablet Commonly known as: ROBAXIN Take 500 mg by mouth in the morning, at noon, and at bedtime.   multivitamin with minerals Tabs tablet Take 1 tablet by mouth daily.   pantoprazole 40 MG tablet Commonly known as: PROTONIX Take 40 mg by mouth 2 (two) times daily before a meal.   Soy Isoflavone 40 MG Tabs Take 80 mg by mouth daily.   SYSTANE HYDRATION PF OP Place 1 drop into both eyes 3 (three) times daily as needed (for dry eyes, when not using Blink Tears).   traZODone 50 MG tablet Commonly known as: DESYREL Patient takes 50 mg in the am and 100 mg at bedtime What changed:   how much to take  how to take this  when to take this  additional instructions   TURMERIC PO Take 1 capsule by mouth daily.   Vitamin B-12 2500 MCG Subl Place 1,250 mcg under the tongue every 7 (seven) days.             Home Infusion Instuctions  (From admission, onward)         Start     Ordered   02/25/20 0000  Home infusion instructions    Question:  Instructions  Answer:  Flushing of vascular access device: 0.9% NaCl pre/post medication administration and prn patency; Heparin 100 u/ml, 8m for implanted ports and Heparin 10u/ml, 550mfor all other central venous catheters.   02/25/20 1425         Follow-up Information    [facility]   Specialty: Home Health Services Contact information: [phone]        CoThayer HeadingsMD. Schedule an appointment as soon as possible for a visit in 3 week(s).   Specialty: Infectious Diseases Contact information: [address] [phone]           Consultations:  ID  ENT  Procedures/Studies:  DG Chest 2 View  Result Date: 02/20/2020 CLINICAL DATA:  Fever and chills EXAM: CHEST - 2 VIEW COMPARISON:  February 16, 2006. 09/23/2013 FINDINGS: There is blunting of the costophrenic angles  bilaterally. There is no pneumothorax. No large focal infiltrate. The heart size is normal. There is no acute osseous abnormality. There are degenerative changes throughout the visualized thoracolumbar spine. Aortic calcifications are noted. IMPRESSION: Blunting of the costophrenic angles bilaterally, which may represent small effusions. No focal infiltrate. Electronically Signed   By: Constance Holster M.D.   On: 02/20/2020 15:18   MR THORACIC SPINE WO CONTRAST  Result Date: 02/22/2020 CLINICAL DATA:  MSSA bacteremia, leg weakness. Cervical infection EXAM: MRI THORACIC AND LUMBAR SPINE WITHOUT CONTRAST TECHNIQUE: Multiplanar and multiecho pulse sequences of the thoracic and lumbar spine were obtained without intravenous contrast. COMPARISON:  MRI cervical spine 02/21/2020 FINDINGS: A portion of the cervical spine was included within the field of  view on the sagittal images again demonstrating extensive infectious findings including prominent prevertebral phlegmon extending from C2 to T1 (series 21, image 8). Ventral epidural phlegmonous changes are also noted from the C2 level extending down to approximately T1-T2 (series 21, image 9). Fluid signal within the C5-6 and C6-7 discs with marrow edema in the C5, C6, and C7 vertebral bodies compatible with discitis-osteomyelitis. MRI THORACIC SPINE FINDINGS Alignment: 2-3 mm grade 1 anterolisthesis T3 on T4. Normal thoracic kyphosis. Vertebrae: The thoracic vertebral body heights are maintained without evidence of fracture. There are a few scattered intraosseous hemangiomas, notably within the T2 and T7 vertebral bodies. No evidence of discitis within the thoracic spine. No evidence of osteomyelitis within the thoracic spine. No suspicious marrow replacing lesion. Cord:  Normal signal and morphology. Paraspinal and other soft tissues: Prevertebral phlegmon and ventral epidural phlegmon again noted extending from the cervical region down to the T1-2 level, better characterized on small field-of-view cervical spine MRI 02/21/2020. Disc levels: No significant disc protrusion. No foraminal or canal stenosis is seen within the thoracic spine. MRI LUMBAR SPINE FINDINGS Segmentation:  Standard. Alignment:  Trace retrolisthesis L3 on L4 and L4 on L5. Vertebrae: No fracture, evidence of discitis, or bone lesion. Incidental intraosseous hemangioma within the L1 vertebral body. Multilevel discogenic endplate marrow changes. Conus medullaris and cauda equina: Conus extends to the L1-2 level. Conus and cauda equina appear normal. Paraspinal and other soft tissues: Negative. Disc levels: T12-L1: No significant disc protrusion, foraminal stenosis, or canal stenosis. L1-L2: Mild diffuse disc bulge. No foraminal or canal stenosis. L2-L3: Mild diffuse disc bulge. No foraminal or canal stenosis. L3-L4: Diffuse disc bulge with  bilateral facet arthropathy and ligamentum flavum buckling resulting in mild canal stenosis and mild right foraminal stenosis. There is a lobulated T1 isointense, T2 slightly hyperintense structure within the ventral epidural space posterior to the L4 vertebral body (series 1, image 8; series 4, images 15-16) which may reflect a large disc herniation, possibly arising from the L3-4 disc. This causes mild canal stenosis at the L4 level. Material extends to the L4-5 subarticular recess on the left (series 4, image 17). L4-L5: Severe left subarticular recess stenosis from extension of probable disc material. Bilateral facet arthropathy contribute to mild left foraminal stenosis and mild canal stenosis. There is a posterior facet synovial cyst on the left at L4-5 (series 2, image 12). L5-S1: Mild disc bulge and bilateral facet arthrosis result in mild left foraminal stenosis. No canal stenosis. IMPRESSION: MRI Thoracic Spine: 1. No evidence for thoracic discitis-osteomyelitis. 2. 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 No appreciable interval change from prior. 3. No significant disc  protrusion, foraminal stenosis, or canal stenosis. MRI Lumbar Spine: 1. No evidence for lumbar discitis-osteomyelitis. 2. Mild multilevel spondylosis with mild canal stenosis at L3-4 and L4-5. 3. Lobulated T1 isointense structure within the ventral epidural space posterior to the L4 vertebral body which may reflect a large disc herniation, possibly arising from the L3-4 disc. This causes mild canal stenosis at L4 level and severe left L4-5 subarticular recess stenosis. Electronically Signed   By: Davina Poke D.O.   On: 02/22/2020 14:41   MR LUMBAR SPINE WO CONTRAST  Result Date: 02/22/2020 CLINICAL DATA:  MSSA bacteremia, leg weakness. Cervical infection EXAM: MRI  THORACIC AND LUMBAR SPINE WITHOUT CONTRAST TECHNIQUE: Multiplanar and multiecho pulse sequences of the thoracic and lumbar spine were obtained without intravenous contrast. COMPARISON:  MRI cervical spine 02/21/2020 FINDINGS: A portion of the cervical spine was included within the field of view on the sagittal images again demonstrating extensive infectious findings including prominent prevertebral phlegmon extending from C2 to T1 (series 21, image 8). Ventral epidural phlegmonous changes are also noted from the C2 level extending down to approximately T1-T2 (series 21, image 9). Fluid signal within the C5-6 and C6-7 discs with marrow edema in the C5, C6, and C7 vertebral bodies compatible with discitis-osteomyelitis. MRI THORACIC SPINE FINDINGS Alignment: 2-3 mm grade 1 anterolisthesis T3 on T4. Normal thoracic kyphosis. Vertebrae: The thoracic vertebral body heights are maintained without evidence of fracture. There are a few scattered intraosseous hemangiomas, notably within the T2 and T7 vertebral bodies. No evidence of discitis within the thoracic spine. No evidence of osteomyelitis within the thoracic spine. No suspicious marrow replacing lesion. Cord:  Normal signal and morphology. Paraspinal and other soft tissues: Prevertebral phlegmon and ventral epidural phlegmon again noted extending from the cervical region down to the T1-2 level, better characterized on small field-of-view cervical spine MRI 02/21/2020. Disc levels: No significant disc protrusion. No foraminal or canal stenosis is seen within the thoracic spine. MRI LUMBAR SPINE FINDINGS Segmentation:  Standard. Alignment:  Trace retrolisthesis L3 on L4 and L4 on L5. Vertebrae: No fracture, evidence of discitis, or bone lesion. Incidental intraosseous hemangioma within the L1 vertebral body. Multilevel discogenic endplate marrow changes. Conus medullaris and cauda equina: Conus extends to the L1-2 level. Conus and cauda equina appear normal. Paraspinal  and other soft tissues: Negative. Disc levels: T12-L1: No significant disc protrusion, foraminal stenosis, or canal stenosis. L1-L2: Mild diffuse disc bulge. No foraminal or canal stenosis. L2-L3: Mild diffuse disc bulge. No foraminal or canal stenosis. L3-L4: Diffuse disc bulge with bilateral facet arthropathy and ligamentum flavum buckling resulting in mild canal stenosis and mild right foraminal stenosis. There is a lobulated T1 isointense, T2 slightly hyperintense structure within the ventral epidural space posterior to the L4 vertebral body (series 1, image 8; series 4, images 15-16) which may reflect a large disc herniation, possibly arising from the L3-4 disc. This causes mild canal stenosis at the L4 level. Material extends to the L4-5 subarticular recess on the left (series 4, image 17). L4-L5: Severe left subarticular recess stenosis from extension of probable disc material. Bilateral facet arthropathy contribute to mild left foraminal stenosis and mild canal stenosis. There is a posterior facet synovial cyst on the left at L4-5 (series 2, image 12). L5-S1: Mild disc bulge and bilateral facet arthrosis result in mild left foraminal stenosis. No canal stenosis. IMPRESSION: MRI Thoracic Spine: 1. No evidence for thoracic discitis-osteomyelitis. 2. 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 No appreciable interval change from prior. 3. No significant disc protrusion, foraminal stenosis, or canal stenosis. MRI Lumbar Spine: 1. No evidence for lumbar discitis-osteomyelitis. 2. Mild multilevel spondylosis with mild canal stenosis at L3-4 and L4-5. 3. Lobulated T1 isointense structure within the ventral epidural space posterior to the L4 vertebral body which may reflect a large disc herniation, possibly arising from the L3-4 disc. This causes mild canal  stenosis at L4 level and severe left L4-5 subarticular recess stenosis. Electronically Signed   By: Davina Poke D.O.   On: 02/22/2020 14:41   MR Cervical Spine W or Wo Contrast  Result Date: 02/21/2020 CLINICAL DATA:  Neck pain and fever, rule out infection. EXAM: MRI CERVICAL SPINE WITHOUT AND WITH CONTRAST TECHNIQUE: Multiplanar and multiecho pulse sequences of the cervical spine, to include the craniocervical junction and cervicothoracic junction, were obtained without and with intravenous contrast. CONTRAST:  4.47m GADAVIST GADOBUTROL 1 MMOL/ML IV SOLN COMPARISON:  Neck CT 09/22/2013 FINDINGS: Multiple sequences are moderate to severely motion degraded, limiting evaluation. Alignment: C3-C4 grade 1 retrolisthesis. C3-C4 grade 1 anterolisthesis. 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 There is marked multilevel degenerative endplate irregularity. There is T1 low signal as well as enhancement within the C4, C5, C6 and C7 vertebrae. Additionally, there is prominent marrow edema and enhancement along the C4-C5 and C5-C6 facet joints on the left. Cord: No definite spinal cord signal abnormality or abnormal cord enhancement is identified within the limitations of significant motion degradation. There is extensive ventral epidural phlegmon spanning the C2-T1 levels. This measures up to 5 mm in greatest AP dimension at the C6-C7 level where there also appears to be formed ventral epidural abscess (series 9, image 10) (series 10, image 25). Abnormal enhancement extends into the neural foramina bilaterally at multiple levels. Posterior Fossa, vertebral arteries, paraspinal tissues: There is extensive ventrolateral paravertebral soft tissue swelling, edema and enhancement consistent extending from the level of the skull base to the T1 level. The prevertebral soft tissues are thickened to 9 mm. Findings are most consistent with cellulitis/phlegmon at this  site. There are small foci of hypoenhancement ventral to the C6 and C7 vertebrae which may reflect relatively spared musculature. Early small abscesses are not excluded Disc levels: Severe multilevel disc degeneration. C2-C3: Congenital fusion. Ventral epidural phlegmon. No significant spinal canal or neural foraminal narrowing. C3-C4: Posterior disc osteophyte complex. Uncinate, facet and ligamentum flavum hypertrophy. Ventral epidural phlegmon. Moderate to moderately advanced spinal canal stenosis. Mild left neural foraminal narrowing. C4-C5: Posterior disc osteophyte complex. Uncinate, facet and ligamentum flavum hypertrophy. Ventral epidural phlegmon. Moderate to moderately advanced spinal canal stenosis. Moderate left neural foraminal narrowing C5-C6: Posterior disc osteophyte complex. Uncinate/facet hypertrophy. Ventral epidural phlegmon. Moderate spinal canal stenosis. Mild bilateral neural foraminal narrowing. C6-C7: Posterior disc osteophyte complex. Ventral epidural abscess. Bilateral disc osteophyte ridge/uncinate hypertrophy. Facet hypertrophy. Ventral epidural abscess. Moderate spinal canal stenosis. Moderate bilateral neural foraminal narrowing (greater on the right). C7-T1: Right-sided disc osteophyte ridge. Facet hypertrophy. No significant spinal canal stenosis. Moderate right with mild left neural foraminal narrowing. These results were called by telephone at the time of interpretation on 02/21/2020 at 2:33 pm to provider Dr. TGustavus Messing who verbally acknowledged these results. IMPRESSION: Multiple sequences are moderate to severely motion degraded, limiting evaluation. 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 Findings consistent with extensive soft tissue infection  within the neck. This includes ventrolateral prevertebral cellulitis/phlegmon extending from the skull base to the T1 level. There are small foci of hypoenhancement anterior to the C6 and C7 vertebrae which may  reflect relatively spared musculature. Small abscesses are not excluded. Ventral epidural phlegmon spanning the C2-T1 levels. This measures up to 8 mm in greatest AP dimension at the C6-C7 level where there also appears to be formed ventral epidural abscess. Abnormal enhancement extends into the bilateral neural foramina at multiple levels. Edema and enhancement within the C4, C5, C6 and C7 vertebrae. Prominent marrow edema and enhancement are also present along the C4-C5 and C5-C6 facet joints on the left. Findings are highly suspicious for osteomyelitis given the constellation of findings, although there is moderate to advanced cervical spondylosis. Multilevel spinal canal stenosis greatest at C3-C4 and C4-C5 (moderate to moderately advanced at these levels). Sites of mild and moderate neural foraminal narrowing as described. Electronically Signed   By: Kellie Simmering DO   On: 02/21/2020 14:35   DG Chest Port 1 View  Result Date: 02/21/2020 CLINICAL DATA:  Fever and cough over the last 10 days. EXAM: PORTABLE CHEST 1 VIEW COMPARISON:  02/20/2020 FINDINGS: The heart size and mediastinal contours are within normal limits. Both lungs are clear. The visualized skeletal structures are unremarkable. IMPRESSION: No active disease. Electronically Signed   By: Nelson Chimes M.D.   On: 02/21/2020 10:55   ECHOCARDIOGRAM COMPLETE  Result Date: 02/23/2020    ECHOCARDIOGRAM REPORT   Patient Name:   JAIRA CANADY Date of Exam: 02/23/2020 Medical Rec #:  [id number]      Height:       62.0 in Accession #:    [id number]     Weight:       121.9 lb Date of Birth:  30-Jun-1951      BSA:          1.549 m Patient Age:    37 years       BP:           127/64 mmHg Patient Gender: F              HR:           97 bpm. Exam Location:  Inpatient Procedure: 2D Echo, Cardiac Doppler and Color Doppler Indications:    Bacteremia 790.7 / R78.81  History:        Patient has no prior history of Echocardiogram examinations.                 Sepsis  (HCC),Cellulitis and abscess of neck.  Sonographer:    Alvino Chapel RCS Referring Phys: [place]  1. Left ventricular ejection fraction, by estimation, is 60 to 65%. The left ventricle has normal function. The left ventricle has no regional wall motion abnormalities. Left ventricular diastolic parameters are indeterminate.  2. Right ventricular systolic function is normal. The right ventricular size is normal.  3. Left atrial size was mildly dilated.  4. The mitral valve is normal in structure. No evidence of mitral valve regurgitation. No evidence of mitral stenosis.  5. The aortic valve is tricuspid. Aortic valve regurgitation is not visualized. Mild aortic valve sclerosis is present, with no evidence of aortic valve stenosis.  6. The inferior vena cava is dilated in size with <50% respiratory variability, suggesting right atrial pressure of 15 mmHg. Conclusion(s)/Recommendation(s): No evidence of valvular vegetations on this transthoracic echocardiogram. Would recommend a transesophageal echocardiogram to exclude infective endocarditis if clinically  indicated. FINDINGS  Left Ventricle: Left ventricular ejection fraction, by estimation, is 60 to 65%. The left ventricle has normal function. The left ventricle has no regional wall motion abnormalities. The left ventricular internal cavity size was normal in size. There is  no left ventricular hypertrophy. Left ventricular diastolic parameters are indeterminate. Right Ventricle: The right ventricular size is normal. No increase in right ventricular wall thickness. Right ventricular systolic function is normal. Left Atrium: Left atrial size was mildly dilated. Right Atrium: Right atrial size was normal in size. Pericardium: There is no evidence of pericardial effusion. Mitral Valve: The mitral valve is normal in structure. Normal mobility of the mitral valve leaflets. No evidence of mitral valve regurgitation. No evidence of mitral valve  stenosis. Tricuspid Valve: The tricuspid valve is normal in structure. Tricuspid valve regurgitation is not demonstrated. No evidence of tricuspid stenosis. Aortic Valve: The aortic valve is tricuspid. Aortic valve regurgitation is not visualized. Mild aortic valve sclerosis is present, with no evidence of aortic valve stenosis. Pulmonic Valve: The pulmonic valve was normal in structure. Pulmonic valve regurgitation is not visualized. No evidence of pulmonic stenosis. Aorta: The aortic root is normal in size and structure. Venous: The inferior vena cava is dilated in size with less than 50% respiratory variability, suggesting right atrial pressure of 15 mmHg. IAS/Shunts: No atrial level shunt detected by color flow Doppler.  LEFT VENTRICLE PLAX 2D LVIDd:         4.55 cm  Diastology LVIDs:         2.85 cm  LV e' lateral:   14.10 cm/s LV PW:         1.10 cm  LV E/e' lateral: 6.7 LV IVS:        1.00 cm  LV e' medial:    10.90 cm/s LVOT diam:     1.70 cm  LV E/e' medial:  8.6 LV SV:         53 LV SV Index:   34 LVOT Area:     2.27 cm  RIGHT VENTRICLE RV S prime:     16.90 cm/s TAPSE (M-mode): 2.0 cm LEFT ATRIUM             Index       RIGHT ATRIUM           Index LA diam:        2.80 cm 1.81 cm/m  RA Area:     14.10 cm LA Vol (A2C):   64.4 ml 41.58 ml/m RA Volume:   32.40 ml  20.92 ml/m LA Vol (A4C):   40.7 ml 26.24 ml/m LA Biplane Vol: 51.4 ml 33.18 ml/m  AORTIC VALVE LVOT Vmax:   116.00 cm/s LVOT Vmean:  83.900 cm/s LVOT VTI:    0.233 m  AORTA Ao Root diam: 2.90 cm MITRAL VALVE MV Area (PHT): 3.27 cm    SHUNTS MV Decel Time: 232 msec    Systemic VTI:  0.23 m MV E velocity: 93.80 cm/s  Systemic Diam: 1.70 cm MV A velocity: 79.30 cm/s MV E/A ratio:  1.18 Candee Furbish MD Electronically signed by Candee Furbish MD Signature Date/Time: 02/23/2020/3:23:17 PM    Final    [place] EKG SITE RITE  Result Date: 02/25/2020 If Site Rite image not attached, placement could not be confirmed due to current cardiac rhythm.  [place] EKG  SITE RITE  Result Date: 02/25/2020 If [facility] image not attached, placement could not be confirmed due to current cardiac rhythm.  Subjective: - no chest pain, shortness of breath, no abdominal pain, nausea or vomiting.   Discharge Exam: BP 115/64   Pulse 71   Temp 98.2 F (36.8 C) (Oral)   Resp 18   Ht 5' 2"  (1.575 m)   Wt 55.3 kg   SpO2 98%   BMI 22.30 kg/m   General: Pt is alert, awake, not in acute distress Cardiovascular: RRR, S1/S2 +, no rubs, no gallops Respiratory: CTA bilaterally, no wheezing, no rhonchi Abdominal: Soft, NT, ND, bowel sounds + Extremities: no edema, no cyanosis    The results of significant diagnostics from this hospitalization (including imaging, microbiology, ancillary and laboratory) are listed below for reference.     Microbiology: Recent Results (from the past 240 hour(s))  Culture, blood (routine x 2)     Status: Abnormal   Collection Time: 02/21/20 10:58 AM   Specimen: BLOOD  Result Value Ref Range Status   Specimen Description BLOOD BLOOD RIGHT FOREARM  Final   Special Requests   Final    BOTTLES DRAWN AEROBIC AND ANAEROBIC Blood Culture adequate volume   Culture  Setup Time   Final    IN BOTH AEROBIC AND ANAEROBIC BOTTLES GRAM POSITIVE COCCI IN CLUSTERS CRITICAL RESULT CALLED TO, READ BACK BY AND VERIFIED WITH: K AMEND [facility] 02/22/20 0420 JDW Performed at [facility], [place] [address] [place], [place] [address]    Culture STAPHYLOCOCCUS AUREUS (A)  Final   Report Status 02/25/2020 FINAL  Final   Organism ID, Bacteria STAPHYLOCOCCUS AUREUS  Final      Susceptibility   Staphylococcus aureus - MIC*    CIPROFLOXACIN <=0.5 SENSITIVE Sensitive     ERYTHROMYCIN <=0.25 SENSITIVE Sensitive     GENTAMICIN <=0.5 SENSITIVE Sensitive     OXACILLIN 0.5 SENSITIVE Sensitive     TETRACYCLINE <=1 SENSITIVE Sensitive     VANCOMYCIN <=0.5 SENSITIVE Sensitive     TRIMETH/SULFA <=10 SENSITIVE Sensitive     CLINDAMYCIN <=0.25 SENSITIVE  Sensitive     RIFAMPIN <=0.5 SENSITIVE Sensitive     Inducible Clindamycin NEGATIVE Sensitive     * STAPHYLOCOCCUS AUREUS  Blood Culture ID Panel (Reflexed)     Status: Abnormal   Collection Time: 02/21/20 10:58 AM  Result Value Ref Range Status   Enterococcus species NOT DETECTED NOT DETECTED Final   Listeria monocytogenes NOT DETECTED NOT DETECTED Final   Staphylococcus species DETECTED (A) NOT DETECTED Final    Comment: CRITICAL RESULT CALLED TO, READ BACK BY AND VERIFIED WITH: K AMEND PHARMD 02/22/20 0420 JDW    Staphylococcus aureus (BCID) DETECTED (A) NOT DETECTED Final    Comment: Methicillin (oxacillin) susceptible Staphylococcus aureus (MSSA). Preferred therapy is anti staphylococcal beta lactam antibiotic (Cefazolin or Nafcillin), unless clinically contraindicated. CRITICAL RESULT CALLED TO, READ BACK BY AND VERIFIED WITH: K AMEND PHARMD 02/22/20 0420 JDW    Methicillin resistance NOT DETECTED NOT DETECTED Final   Streptococcus species NOT DETECTED NOT DETECTED Final   Streptococcus agalactiae NOT DETECTED NOT DETECTED Final   Streptococcus pneumoniae NOT DETECTED NOT DETECTED Final   Streptococcus pyogenes NOT DETECTED NOT DETECTED Final   Acinetobacter baumannii NOT DETECTED NOT DETECTED Final   Enterobacteriaceae species NOT DETECTED NOT DETECTED Final   Enterobacter cloacae complex NOT DETECTED NOT DETECTED Final   Escherichia coli NOT DETECTED NOT DETECTED Final   Klebsiella oxytoca NOT DETECTED NOT DETECTED Final   Klebsiella pneumoniae NOT DETECTED NOT DETECTED Final   Proteus species NOT DETECTED NOT DETECTED Final   Serratia  marcescens NOT DETECTED NOT DETECTED Final   Haemophilus influenzae NOT DETECTED NOT DETECTED Final   Neisseria meningitidis NOT DETECTED NOT DETECTED Final   Pseudomonas aeruginosa NOT DETECTED NOT DETECTED Final   Candida albicans NOT DETECTED NOT DETECTED Final   Candida glabrata NOT DETECTED NOT DETECTED Final   Candida krusei NOT DETECTED  NOT DETECTED Final   Candida parapsilosis NOT DETECTED NOT DETECTED Final   Candida tropicalis NOT DETECTED NOT DETECTED Final    Comment: Performed at [facility], [place] [address], [place], [place] [address]  Culture, blood (routine x 2)     Status: Abnormal   Collection Time: 02/21/20 11:12 AM   Specimen: BLOOD LEFT ARM  Result Value Ref Range Status   Specimen Description BLOOD LEFT ARM  Final   Special Requests   Final    BOTTLES DRAWN AEROBIC AND ANAEROBIC Blood Culture results may not be optimal due to an inadequate volume of blood received in culture bottles   Culture  Setup Time   Final    IN BOTH AEROBIC AND ANAEROBIC BOTTLES GRAM POSITIVE COCCI IN CLUSTERS CRITICAL VALUE NOTED.  VALUE IS CONSISTENT WITH PREVIOUSLY REPORTED AND CALLED VALUE.    Culture (A)  Final    STAPHYLOCOCCUS AUREUS SUSCEPTIBILITIES PERFORMED ON PREVIOUS CULTURE WITHIN THE LAST 5 DAYS. Performed at [facility], [place] [address], [place], [place] [address]    Report Status 02/25/2020 FINAL  Final  Urine culture     Status: Abnormal   Collection Time: 02/21/20 11:25 AM   Specimen: Urine, Random  Result Value Ref Range Status   Specimen Description URINE, RANDOM  Final   Special Requests   Final    NONE Performed at [facility], [place] [address], [place], [place] [address]    Culture 10,000 COLONIES/mL STAPHYLOCOCCUS AUREUS (A)  Final   Report Status 02/23/2020 FINAL  Final   Organism ID, Bacteria STAPHYLOCOCCUS AUREUS (A)  Final      Susceptibility   Staphylococcus aureus - MIC*    CIPROFLOXACIN <=0.5 SENSITIVE Sensitive     GENTAMICIN <=0.5 SENSITIVE Sensitive     NITROFURANTOIN 32 SENSITIVE Sensitive     OXACILLIN 0.5 SENSITIVE Sensitive     TETRACYCLINE <=1 SENSITIVE Sensitive     VANCOMYCIN <=0.5 SENSITIVE Sensitive     TRIMETH/SULFA <=10 SENSITIVE Sensitive     CLINDAMYCIN <=0.25 SENSITIVE Sensitive     RIFAMPIN <=0.5 SENSITIVE Sensitive     Inducible Clindamycin NEGATIVE  Sensitive     * 10,000 COLONIES/mL STAPHYLOCOCCUS AUREUS  Respiratory Panel by RT PCR (Flu A&B, Covid) - Nasopharyngeal Swab     Status: None   Collection Time: 02/21/20  2:45 PM   Specimen: Nasopharyngeal Swab  Result Value Ref Range Status   SARS Coronavirus 2 by RT PCR NEGATIVE NEGATIVE Final    Comment: (NOTE) SARS-CoV-2 target nucleic acids are NOT DETECTED. The SARS-CoV-2 RNA is generally detectable in upper respiratoy specimens during the acute phase of infection. The lowest concentration of SARS-CoV-2 viral copies this assay can detect is 131 copies/mL. A negative result does not preclude SARS-Cov-2 infection and should not be used as the sole basis for treatment or other patient management decisions. A negative result may occur with  improper specimen collection/handling, submission of specimen other than nasopharyngeal swab, presence of viral mutation(s) within the areas targeted by this assay, and inadequate number of viral copies (<131 copies/mL). A negative result must be combined with clinical observations, patient history, and  epidemiological information. The expected result is Negative. Fact Sheet for Patients:  PinkCheek.be Fact Sheet for Healthcare Providers:  GravelBags.it This test is not yet ap proved or cleared by the [place] FDA and  has been authorized for detection and/or diagnosis of SARS-CoV-2 by FDA under an Emergency Use Authorization (EUA). This EUA will remain  in effect (meaning this test can be used) for the duration of the COVID-19 declaration under Section 564(b)(1) of the Act, 21 U.S.C. section 360bbb-3(b)(1), unless the authorization is terminated or revoked sooner.    Influenza A by PCR NEGATIVE NEGATIVE Final   Influenza B by PCR NEGATIVE NEGATIVE Final    Comment: (NOTE) The Xpert Xpress SARS-CoV-2/FLU/RSV assay is intended as an aid in  the diagnosis of influenza from  Nasopharyngeal swab specimens and  should not be used as a sole basis for treatment. Nasal washings and  aspirates are unacceptable for Xpert Xpress SARS-CoV-2/FLU/RSV  testing. Fact Sheet for Patients: PinkCheek.be Fact Sheet for Healthcare Providers: GravelBags.it This test is not yet approved or cleared by the [place] FDA and  has been authorized for detection and/or diagnosis of SARS-CoV-2 by  FDA under an Emergency Use Authorization (EUA). This EUA will remain  in effect (meaning this test can be used) for the duration of the  Covid-19 declaration under Section 564(b)(1) of the Act, 21  U.S.C. section 360bbb-3(b)(1), unless the authorization is  terminated or revoked. Performed at [facility], [place] [address] [place],  [address]   Culture, blood (routine x 2)     Status: None (Preliminary result)   Collection Time: 02/23/20  5:35 AM   Specimen: BLOOD LEFT HAND  Result Value Ref Range Status   Specimen Description BLOOD LEFT HAND  Final   Special Requests   Final    BOTTLES DRAWN AEROBIC AND ANAEROBIC Blood Culture results may not be optimal due to an inadequate volume of blood received in culture bottles   Culture NO GROWTH 2 DAYS  Final   Report Status PENDING  Incomplete  Culture, blood (routine x 2)     Status: None (Preliminary result)   Collection Time: 02/23/20  5:40 AM   Specimen: BLOOD RIGHT ARM  Result Value Ref Range Status   Specimen Description BLOOD RIGHT ARM  Final   Special Requests   Final    BOTTLES DRAWN AEROBIC AND ANAEROBIC Blood Culture adequate volume   Culture NO GROWTH 2 DAYS  Final   Report Status PENDING  Incomplete     Labs: Basic Metabolic Panel: Recent Labs  Lab 02/21/20 1058 02/22/20 0216 02/23/20 0529 02/24/20 0512 02/25/20 0206  NA 135 136 135 135 136  K 3.7 3.4* 3.2* 3.5 3.7  CL 101 102 103 103 108  CO2 22 21* 20* 21* 20*  GLUCOSE 117* 103* 98 108* 113*   BUN 15 13 9 8 9   CREATININE 0.94 0.97 1.01* 0.81 0.74  CALCIUM 8.1* 7.9* 7.5* 7.7* 7.7*   Liver Function Tests: Recent Labs  Lab 02/21/20 1058 02/23/20 0529  AST 22 26  ALT 34 22  ALKPHOS 103 92  BILITOT 0.7 0.8  PROT 6.1* 5.9*  ALBUMIN 2.3* 2.1*   CBC: Recent Labs  Lab 02/21/20 1058 02/22/20 0216 02/23/20 0529 02/24/20 0512 02/25/20 0206  WBC 20.8* 20.2* 17.8* 11.5* 7.9  NEUTROABS 17.8*  --   --   --   --   HGB 9.7* 9.5* 8.8* 8.0* 7.2*  HCT 29.6* 29.8* 27.3* 24.8* 22.4*  MCV  90.5 92.0 90.4 89.9 89.6  PLT 263 273 263 309 324   CBG: No results for input(s): GLUCAP in the last 168 hours. Hgb A1c No results for input(s): HGBA1C in the last 72 hours. Lipid Profile No results for input(s): CHOL, HDL, LDLCALC, TRIG, CHOLHDL, LDLDIRECT in the last 72 hours. Thyroid function studies No results for input(s): TSH, T4TOTAL, T3FREE, THYROIDAB in the last 72 hours.  Invalid input(s): FREET3 Urinalysis    Component Value Date/Time   COLORURINE YELLOW 02/21/2020 [place] 02/21/2020 1044   LABSPEC 1.010 02/21/2020 1044   PHURINE 7.0 02/21/2020 1044   GLUCOSEU NEGATIVE 02/21/2020 1044   HGBUR NEGATIVE 02/21/2020 1044   BILIRUBINUR NEGATIVE 02/21/2020 1044   [place] 02/21/2020 1044   PROTEINUR NEGATIVE 02/21/2020 1044   NITRITE NEGATIVE 02/21/2020 1044   LEUKOCYTESUR NEGATIVE 02/21/2020 1044    FURTHER DISCHARGE INSTRUCTIONS:   Get Medicines reviewed and adjusted: Please take all your medications with you for your next visit with your Primary MD   Laboratory/radiological data: Please request your Primary MD to go over all hospital tests and procedure/radiological results at the follow up, please ask your Primary MD to get all Hospital records sent to his/her office.   In some cases, they will be blood work, cultures and biopsy results pending at the time of your discharge. Please request that your primary care M.D. goes through all the  records of your hospital data and follows up on these results.   Also Note the following: If you experience worsening of your admission symptoms, develop shortness of breath, life threatening emergency, suicidal or homicidal thoughts you must seek medical attention immediately by calling 911 or calling your MD immediately  if symptoms less severe.   You must read complete instructions/literature along with all the possible adverse reactions/side effects for all the Medicines you take and that have been prescribed to you. Take any new Medicines after you have completely understood and accpet all the possible adverse reactions/side effects.    Do not drive when taking Pain medications or sleeping medications (Benzodaizepines)   Do not take more than prescribed Pain, Sleep and Anxiety Medications. It is not advisable to combine anxiety,sleep and pain medications without talking with your primary care practitioner   Special Instructions: If you have smoked or chewed Tobacco  in the last 2 yrs please stop smoking, stop any regular Alcohol  and or any Recreational drug use.   Wear Seat belts while driving.   Please note: You were cared for by a hospitalist during your hospital stay. Once you are discharged, your primary care physician will handle any further medical issues. Please note that NO REFILLS for any discharge medications will be authorized once you are discharged, as it is imperative that you return to your primary care physician (or establish a relationship with a primary care physician if you do not have one) for your post hospital discharge needs so that they can reassess your need for medications and monitor your lab values.  Time coordinating discharge: 40 minutes  SIGNED:  Marzetta Board, MD, PhD 02/25/2020, 4:36 PM

## 2020-02-25 NOTE — Discharge Instructions (Signed)
Follow with Lavone Orn, MD in 2-3 weeks Follow up with Dr Linus Salmons with ID in 2-3 weeks  Please get a complete blood count and chemistry panel checked by your Primary MD at your next visit, and again as instructed by your Primary MD. Please get your medications reviewed and adjusted by your Primary MD.  Please request your Primary MD to go over all Hospital Tests and Procedure/Radiological results at the follow up, please get all Hospital records sent to your Prim MD by signing hospital release before you go home.  In some cases, there will be blood work, cultures and biopsy results pending at the time of your discharge. Please request that your primary care M.D. goes through all the records of your hospital data and follows up on these results.  If you had Pneumonia of Lung problems at the Hospital: Please get a 2 view Chest X ray done in 6-8 weeks after hospital discharge or sooner if instructed by your Primary MD.  If you have Congestive Heart Failure: Please call your Cardiologist or Primary MD anytime you have any of the following symptoms:  1) 3 pound weight gain in 24 hours or 5 pounds in 1 week  2) shortness of breath, with or without a dry hacking cough  3) swelling in the hands, feet or stomach  4) if you have to sleep on extra pillows at night in order to breathe  Follow cardiac low salt diet and 1.5 lit/day fluid restriction.  If you have diabetes Accuchecks 4 times/day, Once in AM empty stomach and then before each meal. Log in all results and show them to your primary doctor at your next visit. If any glucose reading is under 80 or above 300 call your primary MD immediately.  If you have Seizure/Convulsions/Epilepsy: Please do not drive, operate heavy machinery, participate in activities at heights or participate in high speed sports until you have seen by Primary MD or a Neurologist and advised to do so again. Per [facility] statutes, patients with seizures are not  allowed to drive until they have been seizure-free for six months.  Use caution when using heavy equipment or power tools. Avoid working on ladders or at heights. Take showers instead of baths. Ensure the water temperature is not too high on the home water heater. Do not go swimming alone. Do not lock yourself in a room alone (i.e. bathroom). When caring for infants or small children, sit down when holding, feeding, or changing them to minimize risk of injury to the child in the event you have a seizure. Maintain good sleep hygiene. Avoid alcohol.   If you had Gastrointestinal Bleeding: Please ask your Primary MD to check a complete blood count within one week of discharge or at your next visit. Your endoscopic/colonoscopic biopsies that are pending at the time of discharge, will also need to followed by your Primary MD.  Get Medicines reviewed and adjusted. Please take all your medications with you for your next visit with your Primary MD  Please request your Primary MD to go over all hospital tests and procedure/radiological results at the follow up, please ask your Primary MD to get all Hospital records sent to his/her office.  If you experience worsening of your admission symptoms, develop shortness of breath, life threatening emergency, suicidal or homicidal thoughts you must seek medical attention immediately by calling 911 or calling your MD immediately  if symptoms less severe.  You must read complete instructions/literature along with all the  possible adverse reactions/side effects for all the Medicines you take and that have been prescribed to you. Take any new Medicines after you have completely understood and accpet all the possible adverse reactions/side effects.   Do not drive or operate heavy machinery when taking Pain medications.   Do not take more than prescribed Pain, Sleep and Anxiety Medications  Special Instructions: If you have smoked or chewed Tobacco  in the last 2 yrs  please stop smoking, stop any regular Alcohol  and or any Recreational drug use.  Wear Seat belts while driving.  Please note You were cared for by a hospitalist during your hospital stay. If you have any questions about your discharge medications or the care you received while you were in the hospital after you are discharged, you can call the unit and asked to speak with the hospitalist on call if the hospitalist that took care of you is not available. Once you are discharged, your primary care physician will handle any further medical issues. Please note that NO REFILLS for any discharge medications will be authorized once you are discharged, as it is imperative that you return to your primary care physician (or establish a relationship with a primary care physician if you do not have one) for your aftercare needs so that they can reassess your need for medications and monitor your lab values.  You can reach the hospitalist office at phone [phone] or fax [phone]   If you do not have a primary care physician, you can call [phone] for a physician referral.  Activity: As tolerated with Full fall precautions use walker/cane & assistance as needed    Diet: regular  Disposition Home

## 2020-02-25 NOTE — Progress Notes (Addendum)
Subjective: No new issues overnight. No significant throat pain.  Objective: Vital signs in last 24 hours: Temp:  [99.1 F (37.3 C)-99.5 F (37.5 C)] 99.5 F (37.5 C) (03/22 2321) Pulse Rate:  [73-84] 73 (03/22 2321) Resp:  [20] 20 (03/22 0811) BP: (108-114)/(52-64) 108/52 (03/22 2321) SpO2:  [93 %-100 %] 93 % (03/22 2321)  Physical Exam Vitalsand nursing notereviewed.  Constitutional: She is not in acute distress.  Eyes: Pupils are equal, round, reactive to light. Extraocular motion is intact.  Ears: Examination of the ears shows normal auricles and external auditory canals bilaterally.  Nose: Nasal examination shows normal mucosa, septum, turbinates.  Face: Facial examination shows no asymmetry. Palpation of the face elicit no significant tenderness.  Mouth: Oral cavity examination shows no mucosal lacerations, edema, or erythema. No significant trismus is noted.  Neck:Lateral neck isnottender to touch. No palpable mass. No fluctuance.The trachea is midline. The thyroid is not significantly enlarged.  Neuro: Cranial nerves 2-12 are all grossly in tact. Pulmonary: Pulmonary effort is normal. Norespiratory distress.  Skin: Skin is warmand dry. No bruising,erythema,lesionor rash.  Neurological: No focal deficitpresent. She is alert.  Psychiatric: Moodnormal. Behaviornormal.    Recent Labs    02/24/20 0512 02/25/20 0206  WBC 11.5* 7.9  HGB 8.0* 7.2*  HCT 24.8* 22.4*  PLT 309 324   Recent Labs    02/24/20 0512 02/25/20 0206  NA 135 136  K 3.5 3.7  CL 103 108  CO2 21* 20*  GLUCOSE 108* 113*  BUN 8 9  CREATININE 0.81 0.74  CALCIUM 7.7* 7.7*    Medications:  I have reviewed the patient's current medications. Scheduled: . Budesonide 0.5 mg/2 ml inhalation suspension  1 mg Oral QHS  . enoxaparin (LOVENOX) injection  40 mg Subcutaneous Q24H  . ferrous sulfate  325 mg Oral Once per day on Mon Wed Fri  . gabapentin  100 mg Oral TID  . pantoprazole   40 mg Oral BID AC  . traZODone  100 mg Oral QHS  . traZODone  50 mg Oral Daily  . vitamin B-12  1,250 mcg Oral Daily   Continuous: .  ceFAZolin (ANCEF) IV 2 g (02/25/20 0631)   [id number] **OR** acetaminophen, ALPRAZolam, HYDROmorphone (DILAUDID) injection, LORazepam, methocarbamol, ondansetron **OR** ondansetron (ZOFRAN) IV, oxymetazoline  Assessment/Plan: Prevertebralcellulitis/phlegmonand bacteremia.Clinically improved. - Continue abx as per ID. Will not need any surgical intervention. - WBC has normalized. - Pt may follow up with me as an outpatient. Pt may call [phone] for an appt in 1 week.   LOS: 4 days   Megan Cox W Megan Cox 02/25/2020, 7:11 AM

## 2020-02-26 LAB — BPAM RBC
Blood Product Expiration Date: 202104202359
ISSUE DATE / TIME: 202103231223
Unit Type and Rh: 6200

## 2020-02-26 LAB — TYPE AND SCREEN
ABO/RH(D): A POS
Antibody Screen: NEGATIVE
Unit division: 0

## 2020-02-28 LAB — CULTURE, BLOOD (ROUTINE X 2)
Culture: NO GROWTH
Culture: NO GROWTH
Special Requests: ADEQUATE

## 2020-03-05 ENCOUNTER — Telehealth: Payer: Self-pay

## 2020-03-05 DIAGNOSIS — R7881 Bacteremia: Secondary | ICD-10-CM

## 2020-03-05 DIAGNOSIS — B9561 Methicillin susceptible Staphylococcus aureus infection as the cause of diseases classified elsewhere: Secondary | ICD-10-CM

## 2020-03-05 HISTORY — DX: Bacteremia: R78.81

## 2020-03-05 HISTORY — DX: Methicillin susceptible Staphylococcus aureus infection as the cause of diseases classified elsewhere: B95.61

## 2020-03-05 NOTE — Telephone Encounter (Signed)
There is no problem with her taking soy tablets.  Please ask her to take her temperature at night before bedtime and if she wakes up with night sweats to see if she is having fever.  Also, please see if her home nurse can draw blood cultures.  If not please see if she can come into clinic to have blood cultures drawn to make sure that she is not still having staph bacteremia or some other new PICC infection.

## 2020-03-05 NOTE — Telephone Encounter (Signed)
Patient states she is not checking her temp before bed. Will start doing this for the next couple of days.  Spoke with Jackelyn Poling who will have home health draw blood cultures. [place]

## 2020-03-05 NOTE — Telephone Encounter (Signed)
Patient called office today with concerns for potential side effects. States Dr. Megan Salon saw her while she was in the hospital and started her on IV ancef.  Patient notices for the past several days she has had night sweats ( 1-2 times a night).  Would like to know if she would be okay taking Soy tabs; states this helped her last time she has night sweats.  Will forward message to MD to advise. [place]

## 2020-03-09 ENCOUNTER — Telehealth: Payer: Self-pay

## 2020-03-09 NOTE — Telephone Encounter (Signed)
Nurse Case Manager,  Rutherford Guys is calling regarding the order for blood cultures. She does not have the tubes to draw and has not been able to obtain them.  She would like to know if patient can come to our office for cultures.    Nurse advised to stop by our office for culture containers and ask for Dolan Amen.  Office hours given.  [phone] phone    Yonah Tangeman Mahlon Gammon, RN

## 2020-03-09 NOTE — Telephone Encounter (Signed)
She does report feeling generally better, however is still reporting that she isn't sleeping very much at night. Using Salonpas patches which have helped but still waking every 2 hours. She does report night sweats but they have decreased from 2-3 heavy episodes down to 1-2 a night with the use of soy.   Karee Forge Lorita Officer, RN

## 2020-03-09 NOTE — Telephone Encounter (Signed)
Did she happen to mention whether or not she is feeling any better or if she is still having night sweats?

## 2020-03-09 NOTE — Telephone Encounter (Signed)
Thanks

## 2020-03-09 NOTE — Telephone Encounter (Signed)
Patient left voicemail reporting bedtime temps. States that the highest her temps got to were 98.8 degrees. Doesn't report any fevers or chills. Stated that home health will be stopping by the office to obtain blood culture materials. She does report weight loss since being discharged from the hospital. From 108. 5lbs at the hospital to 102lbs this morning. Attributes this to fluids from the hospital.   Carlean Purl, RN

## 2020-03-10 ENCOUNTER — Encounter: Payer: Self-pay | Admitting: Internal Medicine

## 2020-03-17 ENCOUNTER — Telehealth: Payer: Self-pay | Admitting: Pharmacist

## 2020-03-17 NOTE — Telephone Encounter (Signed)
Patient's blood culture from 4/6 returned positive for MSSA. Patient is currently receiving Ancef through PICC line. Patient usually sees Dr. Megan Salon but will send to covering provider for advice.

## 2020-03-18 ENCOUNTER — Other Ambulatory Visit: Payer: Self-pay

## 2020-03-18 ENCOUNTER — Inpatient Hospital Stay (HOSPITAL_COMMUNITY)
Admission: EM | Admit: 2020-03-18 | Discharge: 2020-03-21 | DRG: 872 | Disposition: A | Discharge status: Home Health | Payer: Medicare PPO | Attending: Family Medicine | Admitting: Family Medicine

## 2020-03-18 ENCOUNTER — Encounter (HOSPITAL_COMMUNITY): Payer: Self-pay

## 2020-03-18 DIAGNOSIS — B9561 Methicillin susceptible Staphylococcus aureus infection as the cause of diseases classified elsewhere: Secondary | ICD-10-CM

## 2020-03-18 DIAGNOSIS — Z803 Family history of malignant neoplasm of breast: Secondary | ICD-10-CM

## 2020-03-18 DIAGNOSIS — Z91041 Radiographic dye allergy status: Secondary | ICD-10-CM

## 2020-03-18 DIAGNOSIS — A4101 Sepsis due to Methicillin susceptible Staphylococcus aureus: Secondary | ICD-10-CM | POA: Diagnosis not present

## 2020-03-18 DIAGNOSIS — R7881 Bacteremia: Secondary | ICD-10-CM | POA: Diagnosis present

## 2020-03-18 DIAGNOSIS — Z20822 Contact with and (suspected) exposure to covid-19: Secondary | ICD-10-CM | POA: Diagnosis present

## 2020-03-18 DIAGNOSIS — M4642 Discitis, unspecified, cervical region: Secondary | ICD-10-CM | POA: Diagnosis present

## 2020-03-18 DIAGNOSIS — F4310 Post-traumatic stress disorder, unspecified: Secondary | ICD-10-CM | POA: Diagnosis present

## 2020-03-18 DIAGNOSIS — K200 Eosinophilic esophagitis: Secondary | ICD-10-CM | POA: Diagnosis present

## 2020-03-18 DIAGNOSIS — Z79899 Other long term (current) drug therapy: Secondary | ICD-10-CM

## 2020-03-18 DIAGNOSIS — K649 Unspecified hemorrhoids: Secondary | ICD-10-CM | POA: Diagnosis present

## 2020-03-18 DIAGNOSIS — M4622 Osteomyelitis of vertebra, cervical region: Secondary | ICD-10-CM | POA: Diagnosis present

## 2020-03-18 DIAGNOSIS — M4640 Discitis, unspecified, site unspecified: Secondary | ICD-10-CM

## 2020-03-18 DIAGNOSIS — R079 Chest pain, unspecified: Secondary | ICD-10-CM

## 2020-03-18 DIAGNOSIS — Z7951 Long term (current) use of inhaled steroids: Secondary | ICD-10-CM

## 2020-03-18 DIAGNOSIS — D649 Anemia, unspecified: Secondary | ICD-10-CM | POA: Diagnosis present

## 2020-03-18 HISTORY — DX: Esophagitis, unspecified without bleeding: K20.90

## 2020-03-18 NOTE — Telephone Encounter (Signed)
Patient called for blood culture results, next steps. Please advise.  She also wants to mention that her brother in law is scheduled for aortic aneurysm repair 4/19 at [facility]. Her sister is almost 2 and Megan Cox wants to be with her if at all possible. She has cleared this with her home health nurse for how to administer her own medication while at [facility].  Megan Cox saw her PCP Dr Laurann Montana.  Soft stools for 3 weeks, taking probiotic/yogurt/apples.  1 day last week had 6 bowel movements over the weekend. She brought a stool sample for c diff testing to her PCP today, should have results by 4/16.

## 2020-03-18 NOTE — Telephone Encounter (Signed)
Per Cassie and Janene Madeira, called patient and advised her that her blood cultures were positive, asked her to go to the ER as it is difficult to know if she is failing antibiotic therapy or if her PICC is infected.  Patient stated she is finishing her afternoon antibiotic dosing now, states she will make plans to go. Landis Gandy, RN

## 2020-03-18 NOTE — ED Triage Notes (Signed)
Pt sent here for possible infection in her PICC Line in her right arm. Infectious disease called her today and states her blood cultures were positive for infection. Pt being treated for a spinal infection with abx through her PICC. Pt denies any redness, swelling or problems with PICC line. Pt also having loose stools since starting Ancef on 3/21 .Pt sent sample to PCP today to test for c diff. Pt a.o, nad noted

## 2020-03-19 ENCOUNTER — Inpatient Hospital Stay (HOSPITAL_COMMUNITY): Payer: Medicare PPO

## 2020-03-19 ENCOUNTER — Encounter (HOSPITAL_COMMUNITY): Payer: Self-pay | Admitting: Internal Medicine

## 2020-03-19 DIAGNOSIS — I340 Nonrheumatic mitral (valve) insufficiency: Secondary | ICD-10-CM

## 2020-03-19 DIAGNOSIS — I361 Nonrheumatic tricuspid (valve) insufficiency: Secondary | ICD-10-CM | POA: Diagnosis not present

## 2020-03-19 DIAGNOSIS — M4642 Discitis, unspecified, cervical region: Secondary | ICD-10-CM | POA: Diagnosis present

## 2020-03-19 DIAGNOSIS — K200 Eosinophilic esophagitis: Secondary | ICD-10-CM

## 2020-03-19 DIAGNOSIS — Z7951 Long term (current) use of inhaled steroids: Secondary | ICD-10-CM | POA: Diagnosis not present

## 2020-03-19 DIAGNOSIS — Z91041 Radiographic dye allergy status: Secondary | ICD-10-CM

## 2020-03-19 DIAGNOSIS — K921 Melena: Secondary | ICD-10-CM | POA: Diagnosis not present

## 2020-03-19 DIAGNOSIS — Z79899 Other long term (current) drug therapy: Secondary | ICD-10-CM | POA: Diagnosis not present

## 2020-03-19 DIAGNOSIS — F4310 Post-traumatic stress disorder, unspecified: Secondary | ICD-10-CM | POA: Diagnosis present

## 2020-03-19 DIAGNOSIS — R7881 Bacteremia: Secondary | ICD-10-CM | POA: Diagnosis present

## 2020-03-19 DIAGNOSIS — F329 Major depressive disorder, single episode, unspecified: Secondary | ICD-10-CM

## 2020-03-19 DIAGNOSIS — Z20822 Contact with and (suspected) exposure to covid-19: Secondary | ICD-10-CM | POA: Diagnosis present

## 2020-03-19 DIAGNOSIS — R079 Chest pain, unspecified: Secondary | ICD-10-CM | POA: Diagnosis not present

## 2020-03-19 DIAGNOSIS — M4622 Osteomyelitis of vertebra, cervical region: Secondary | ICD-10-CM | POA: Diagnosis present

## 2020-03-19 DIAGNOSIS — R197 Diarrhea, unspecified: Secondary | ICD-10-CM

## 2020-03-19 DIAGNOSIS — M4640 Discitis, unspecified, site unspecified: Secondary | ICD-10-CM | POA: Diagnosis not present

## 2020-03-19 DIAGNOSIS — D649 Anemia, unspecified: Secondary | ICD-10-CM | POA: Diagnosis present

## 2020-03-19 DIAGNOSIS — B9561 Methicillin susceptible Staphylococcus aureus infection as the cause of diseases classified elsewhere: Secondary | ICD-10-CM | POA: Diagnosis not present

## 2020-03-19 DIAGNOSIS — K649 Unspecified hemorrhoids: Secondary | ICD-10-CM | POA: Diagnosis present

## 2020-03-19 DIAGNOSIS — A4101 Sepsis due to Methicillin susceptible Staphylococcus aureus: Secondary | ICD-10-CM | POA: Diagnosis present

## 2020-03-19 DIAGNOSIS — Z803 Family history of malignant neoplasm of breast: Secondary | ICD-10-CM | POA: Diagnosis not present

## 2020-03-19 DIAGNOSIS — M4620 Osteomyelitis of vertebra, site unspecified: Secondary | ICD-10-CM | POA: Diagnosis not present

## 2020-03-19 LAB — CBC
HCT: 35.2 % — ABNORMAL LOW (ref 36.0–46.0)
Hemoglobin: 11 g/dL — ABNORMAL LOW (ref 12.0–15.0)
MCH: 29 pg (ref 26.0–34.0)
MCHC: 31.3 g/dL (ref 30.0–36.0)
MCV: 92.9 fL (ref 80.0–100.0)
Platelets: 324 10*3/uL (ref 150–400)
RBC: 3.79 MIL/uL — ABNORMAL LOW (ref 3.87–5.11)
RDW: 14.9 % (ref 11.5–15.5)
WBC: 5.4 10*3/uL (ref 4.0–10.5)
nRBC: 0 % (ref 0.0–0.2)

## 2020-03-19 LAB — CREATININE, SERUM
Creatinine, Ser: 0.87 mg/dL (ref 0.44–1.00)
GFR calc Af Amer: >60 mL/min (ref >60)
GFR calc non Af Amer: >60 mL/min (ref >60)

## 2020-03-19 LAB — CBC WITH DIFFERENTIAL/PLATELET
Abs Immature Granulocytes: 0.01 10*3/uL (ref 0.00–0.07)
Basophils Absolute: 0.1 10*3/uL (ref 0.0–0.1)
Basophils Relative: 1 %
Eosinophils Absolute: 0.2 10*3/uL (ref 0.0–0.5)
Eosinophils Relative: 4 %
HCT: 35.9 % — ABNORMAL LOW (ref 36.0–46.0)
Hemoglobin: 11.1 g/dL — ABNORMAL LOW (ref 12.0–15.0)
Immature Granulocytes: 0 %
Lymphocytes Relative: 34 %
Lymphs Abs: 1.7 10*3/uL (ref 0.7–4.0)
MCH: 28.5 pg (ref 26.0–34.0)
MCHC: 30.9 g/dL (ref 30.0–36.0)
MCV: 92.1 fL (ref 80.0–100.0)
Monocytes Absolute: 0.8 10*3/uL (ref 0.1–1.0)
Monocytes Relative: 16 %
Neutro Abs: 2.2 10*3/uL (ref 1.7–7.7)
Neutrophils Relative %: 45 %
Platelets: 339 10*3/uL (ref 150–400)
RBC: 3.9 MIL/uL (ref 3.87–5.11)
RDW: 14.9 % (ref 11.5–15.5)
WBC: 5 10*3/uL (ref 4.0–10.5)
nRBC: 0 % (ref 0.0–0.2)

## 2020-03-19 LAB — BASIC METABOLIC PANEL
Anion gap: 10 (ref 5–15)
BUN: 11 mg/dL (ref 8–23)
CO2: 25 mmol/L (ref 22–32)
Calcium: 9.3 mg/dL (ref 8.9–10.3)
Chloride: 103 mmol/L (ref 98–111)
Creatinine, Ser: 0.77 mg/dL (ref 0.44–1.00)
GFR calc Af Amer: >60 mL/min (ref >60)
GFR calc non Af Amer: >60 mL/min (ref >60)
Glucose, Bld: 107 mg/dL — ABNORMAL HIGH (ref 70–99)
Potassium: 3.7 mmol/L (ref 3.5–5.1)
Sodium: 138 mmol/L (ref 135–145)

## 2020-03-19 LAB — ECHOCARDIOGRAM LIMITED
Height: 62 in
Weight: 1604.95 oz

## 2020-03-19 LAB — TROPONIN I (HIGH SENSITIVITY): Troponin I (High Sensitivity): 4 ng/L (ref <18)

## 2020-03-19 LAB — D-DIMER, QUANTITATIVE: D-Dimer, Quant: 1.51 ug/mL-FEU — ABNORMAL HIGH (ref 0.00–0.50)

## 2020-03-19 LAB — SARS CORONAVIRUS 2 BY RT PCR (DIASORIN): SARS Coronavirus 2: NEGATIVE

## 2020-03-19 IMAGING — DX DG CHEST 1V PORT
1 series · 1 of 1 positions shown · non-contrast
Comparison: Chest x-ray [DATE].

CLINICAL DATA: 68-year-old female with history of positive blood
cultures.

EXAM:
PORTABLE CHEST 1 VIEW

[chest]
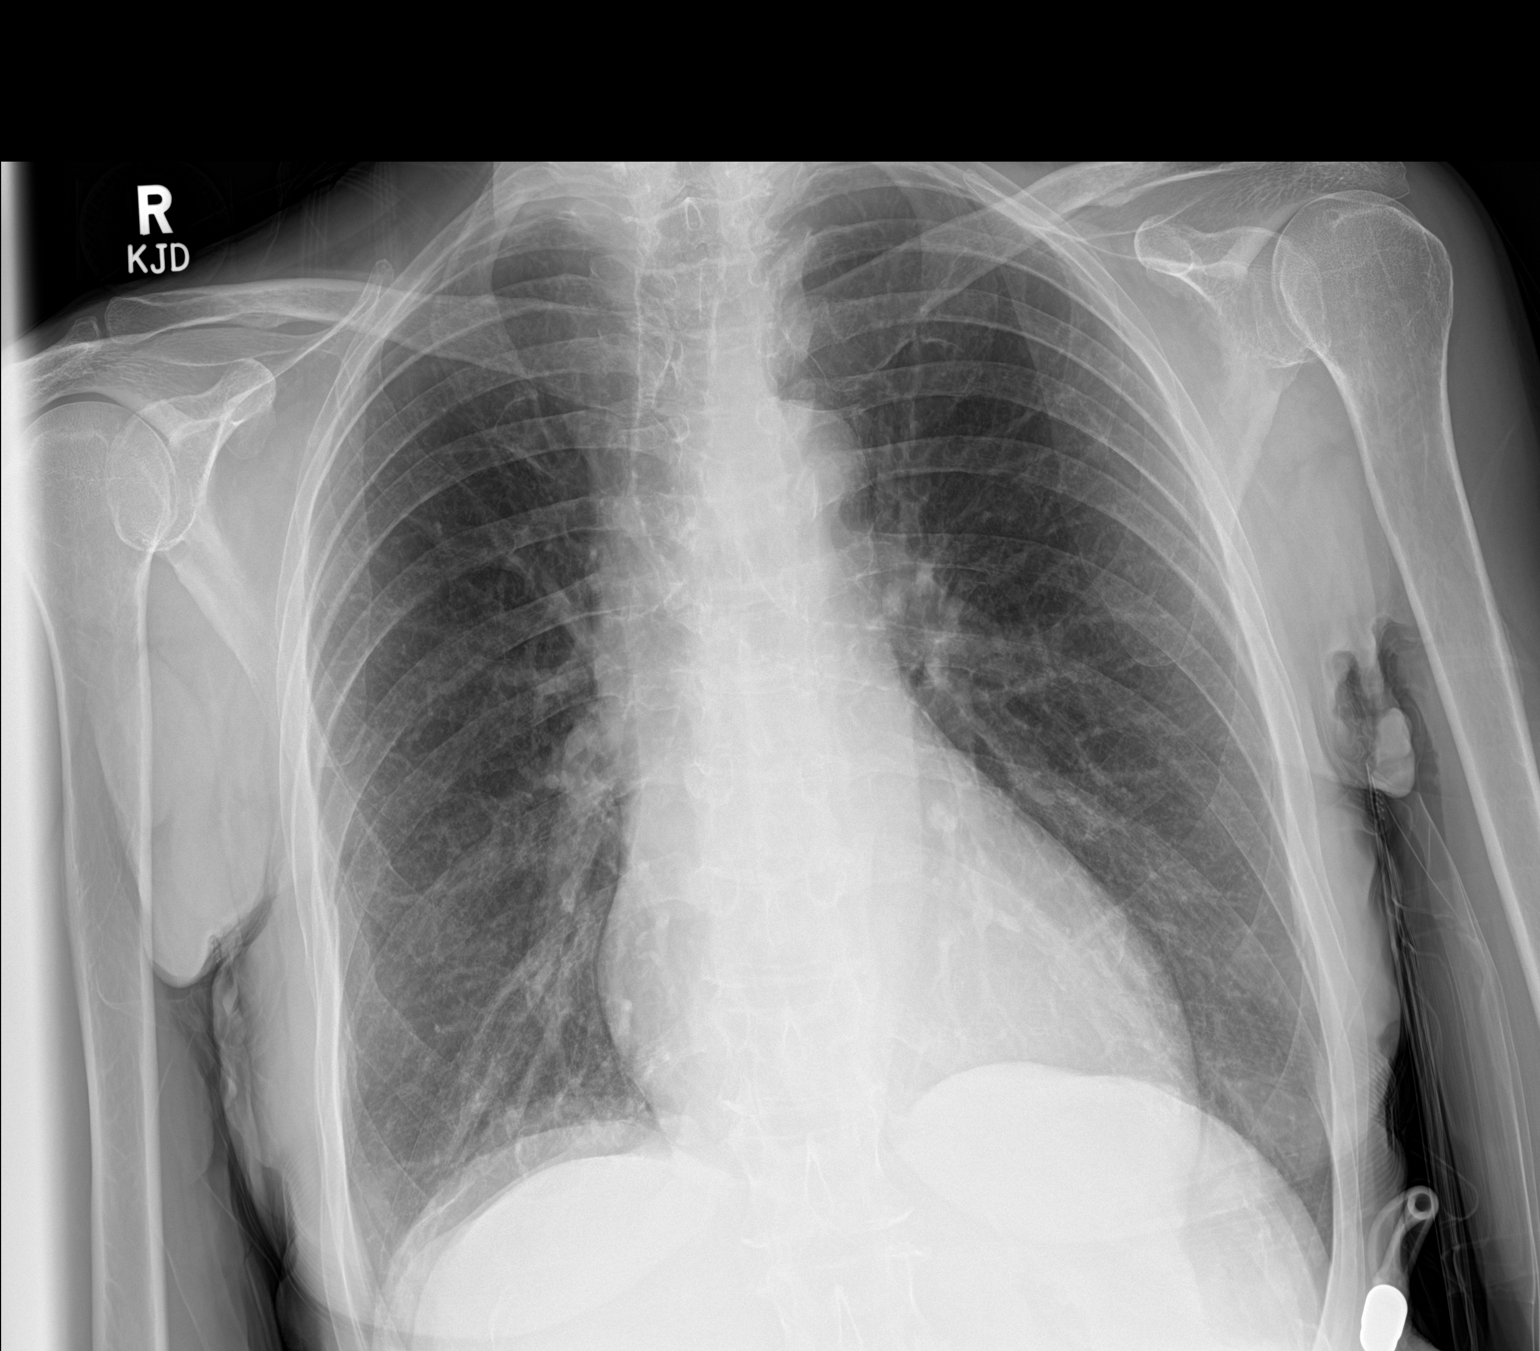

[1 of 1 positions shown; findings below may reference images not displayed]

FINDINGS: Lung volumes are normal. No consolidative airspace disease. No
pleural effusions. No pneumothorax. No pulmonary nodule or mass
noted. Pulmonary vasculature and the cardiomediastinal silhouette
are within normal limits.
IMPRESSION: No radiographic evidence of acute cardiopulmonary disease.

## 2020-03-19 IMAGING — MR MR CERVICAL SPINE W/O CM
5 of 6 series · 34 of 48 positions shown · IV contrast (agent unspecified)
Comparison: Cervical spine MRI [DATE]. [DATE] and thoracic
spine

CLINICAL DATA: 68-year-old female. Neck pain, fever, MSSA
bacteremia. With evidence of cervical paravertebral soft tissue
infection and osteomyelitis by MRI [DATE]. Persistently positive
blood cultures. Restaging.

A study without and with contrast was planned, but the patient was
unable to tolerate and discontinued the exam prior to completion. No
contrast was administered.
EXAM:
MRI CERVICAL SPINE WITHOUT CONTRAST
TECHNIQUE: Multiplanar, multisequence MR imaging of the cervical spine was
performed. No intravenous contrast was administered.

[Series 5: T2 · sagittal · 3.0mm · 0.69mm/px · 6 of 15 slices shown (1 of 2)]
[im 1/15]
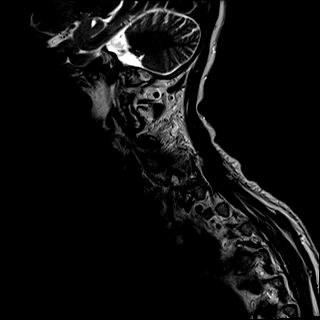
[im 3/15]
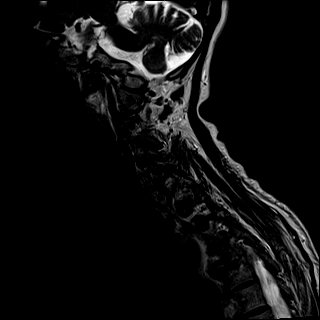
[im 6/15]
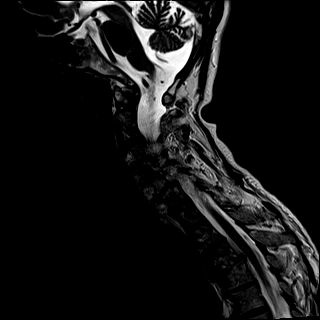
[im 9/15]
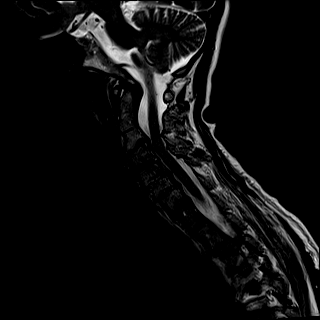
[im 12/15]
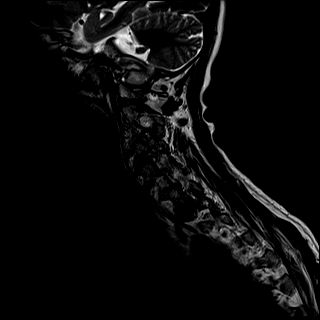
[im 15/15]
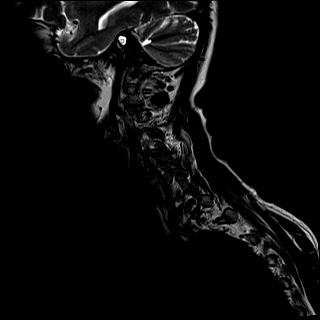

[Series 6: T1 · sagittal · 3.0mm · 0.69mm/px · 6 of 15 slices shown (1 of 2)]
[im 1/15]
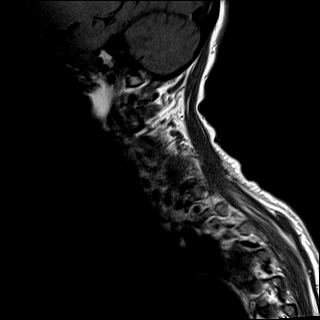
[im 3/15]
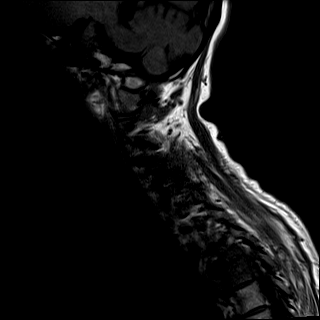
[im 6/15]
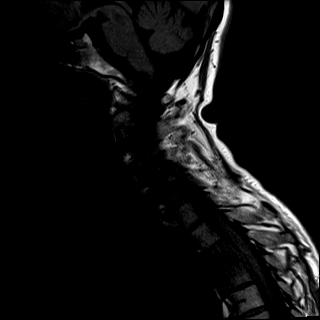
[im 9/15]
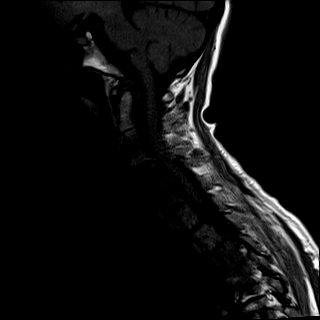
[im 12/15]
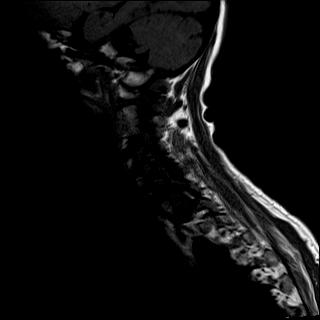
[im 15/15]
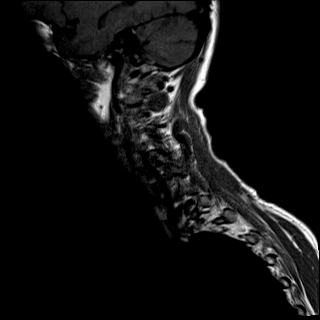

[Series 7: STIR · sagittal · 3.0mm · 0.86mm/px · 6 of 15 slices shown]
[im 1/15]
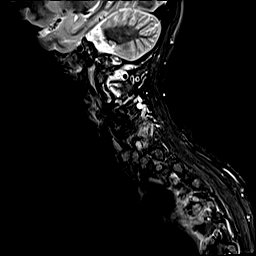
[im 3/15]
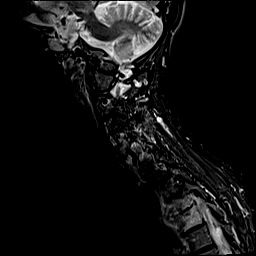
[im 6/15]
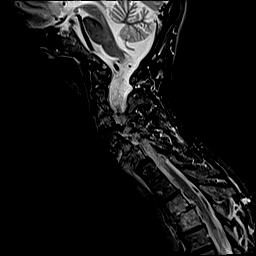
[im 9/15]
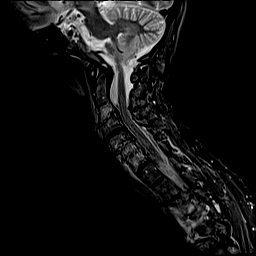
[im 12/15]
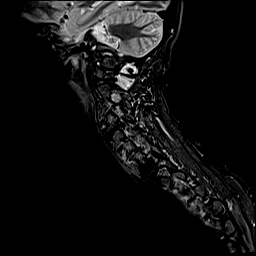
[im 15/15]
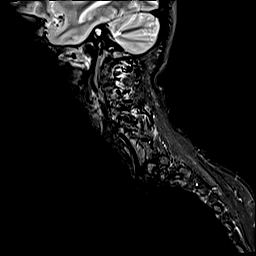

[Series 8: T2 · axial · 3.0mm · 0.66mm/px · z∈[-86,-22]mm · 8 of 24 slices shown (2 of 2)]
[im 1/24]
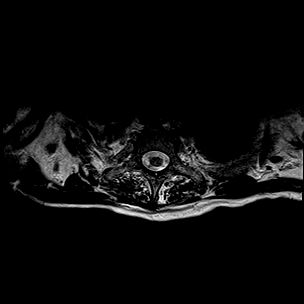
[im 3/24]
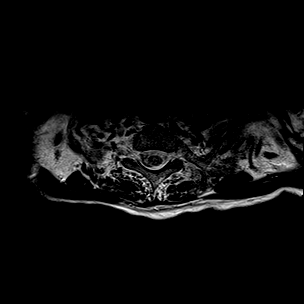
[im 8/24]
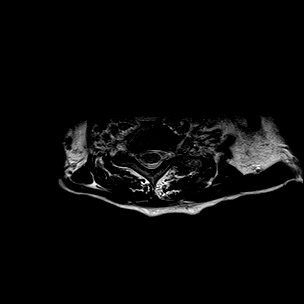
[im 11/24]
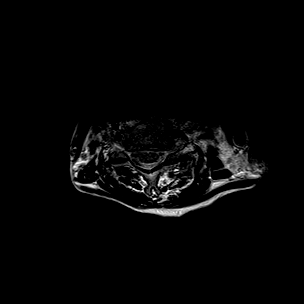
[im 13/24]
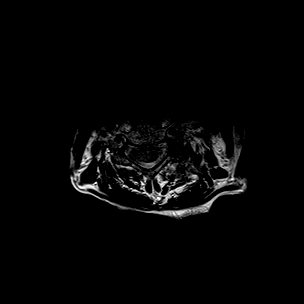
[im 16/24]
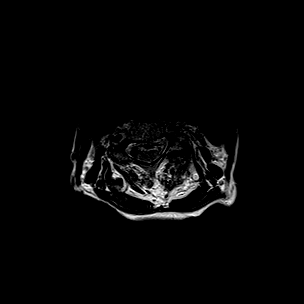
[im 21/24]
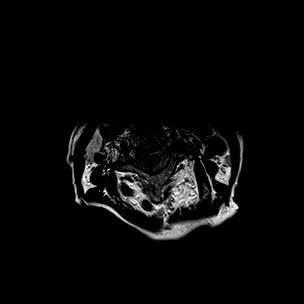
[im 24/24]
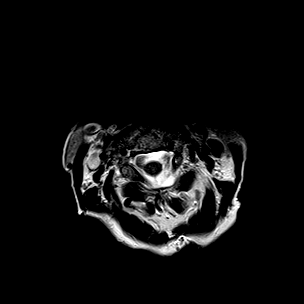

[Series 10: T1 · axial · 3.0mm · 0.39mm/px · z∈[-86,-22]mm · 8 of 25 slices shown (2 of 2)]
[im 1/25]
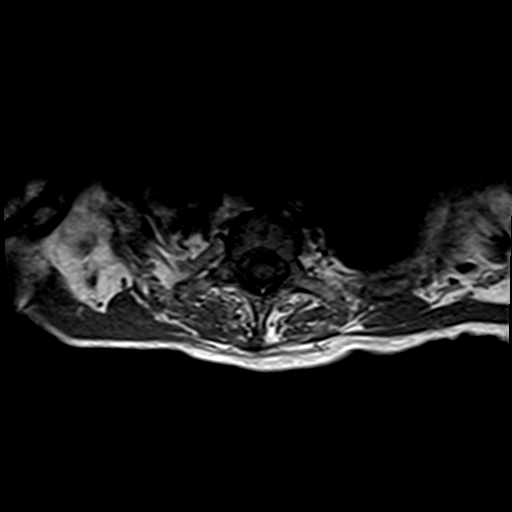
[im 3/25]
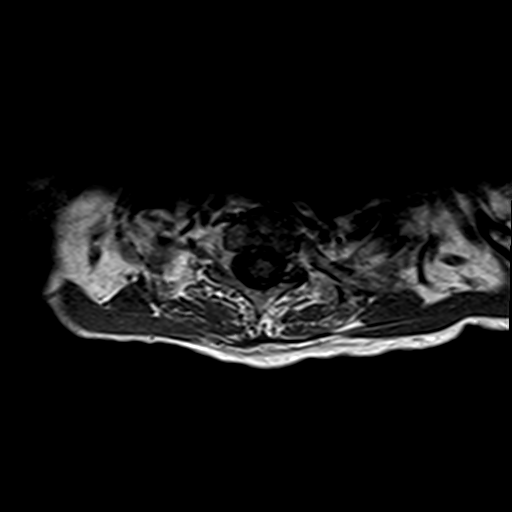
[im 9/25]
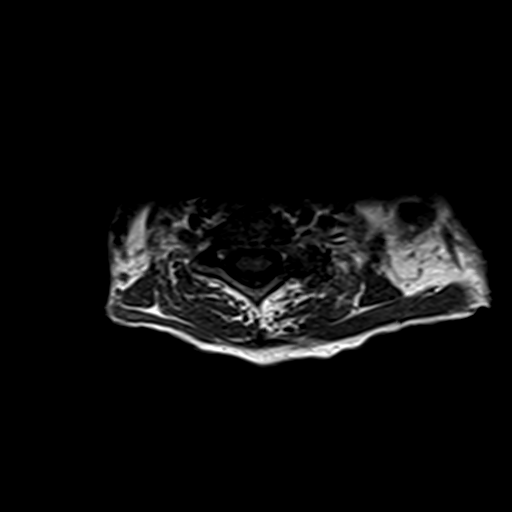
[im 11/25]
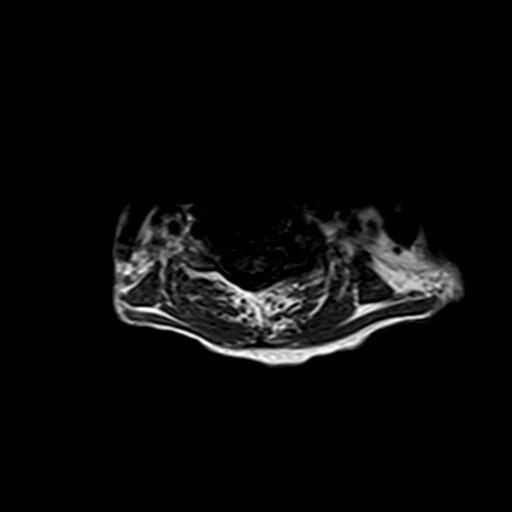
[im 14/25]
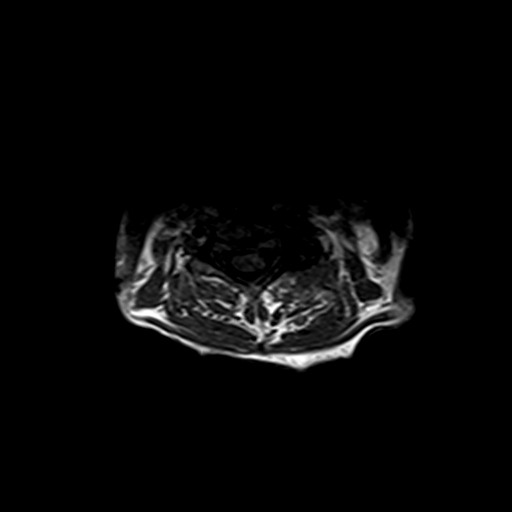
[im 17/25]
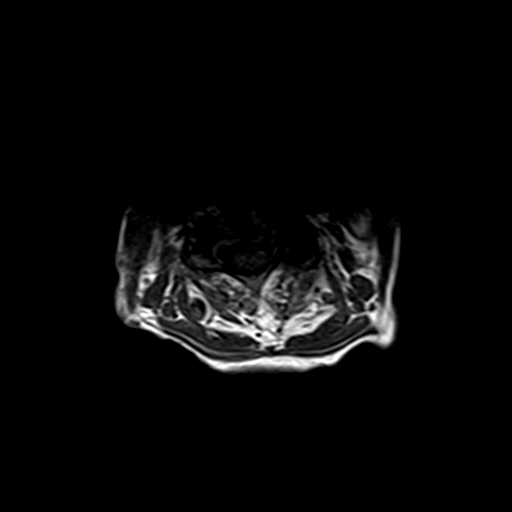
[im 22/25]
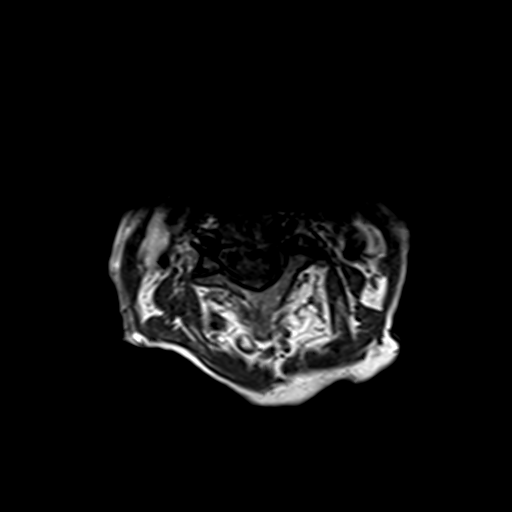
[im 25/25]
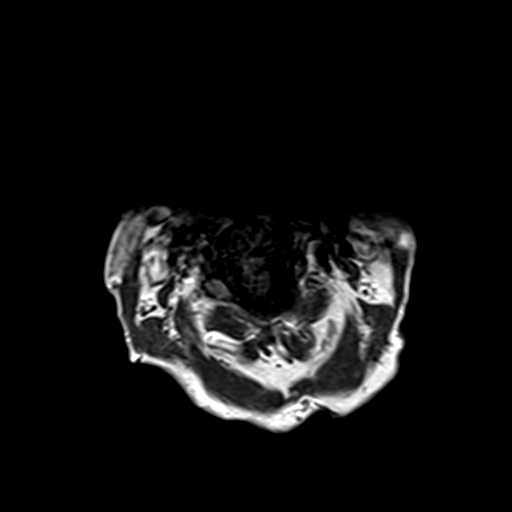

[34 of 48 positions shown; findings below may reference images not displayed]

FINDINGS: Congenital incomplete segmentation of the levels C2-C3 and also
T1-T2.

Alignment: Stable straightening of cervical lordosis. Mildly
increased kyphosis at T3-T4.

Vertebrae: Abnormal decreased T1 signal and evidence of marrow edema
a especially at the C5 through C7 vertebrae and the T3-T4 vertebrae.
See series 6 images 8 and 10. And there has been partial endplate
erosion anteriorly at T3-T4 since [DATE].

There is also some vertebral endplate marrow edema suspected at
C3-C4. The C6 C7 left side facets were highly edematous previously,
and there is evidence of some residual marrow edema there today.

Cord: Spinal cord signal is within normal limits at all visualized
levels.

Posterior Fossa, vertebral arteries, paraspinal tissues:
Cervicomedullary junction is within normal limits. Negative visible
brain parenchyma.

Preserved major vascular flow voids in the neck.

Regressed cervical prevertebral edema and phlegmon since [REDACTED].
However, there is a new small area of left T3-T4 prevertebral fluid
or abscess on series 5, image 14.

Negative visible lung apices.

Disc levels:

Regressed ventral epidural edema and/or dural thickening since
[REDACTED]. There is mild multifactorial spinal stenosis C3-C4 through
C5-C6. Up to mild spinal cord mass effect, but no cord signal
abnormality.
IMPRESSION: 1. Evidence of new Discitis Osteomyelitis at T3-T4 since the [REDACTED]
MRI, with a small associated prevertebral phlegmon or tiny abscess.

2. Regression of the widespread Cervical Spine Discitis
Osteomyelitis since [REDACTED], although marrow edema persists at C3-C4
and also C5 through C7.

3. The patient was unable to tolerate the entire exam and
discontinued it prior to completion, with no contrast administered.

## 2020-03-19 MED ORDER — ENOXAPARIN SODIUM 30 MG/0.3ML ~~LOC~~ SOLN
30.0000 mg | Freq: Every day | SUBCUTANEOUS | Status: DC
  Administered 2020-03-21: 30 mg via SUBCUTANEOUS
  Filled 2020-03-19: qty 0.3

## 2020-03-19 MED ORDER — NONFORMULARY OR COMPOUNDED ITEM
1.0000 mg | Freq: Every day | Status: DC
  Administered 2020-03-19 – 2020-03-20 (×2): 1 mg via ORAL
  Filled 2020-03-19 (×2): qty 1

## 2020-03-19 MED ORDER — CYANOCOBALAMIN 250 MCG PO TABS: 1250.0000 ug | ORAL_TABLET | ORAL | Status: DC

## 2020-03-19 MED ORDER — ONDANSETRON HCL 4 MG/2ML IJ SOLN: 4.0000 mg | Freq: Four times a day (QID) | INTRAMUSCULAR | Status: DC | PRN

## 2020-03-19 MED ORDER — ENOXAPARIN SODIUM 30 MG/0.3ML ~~LOC~~ SOLN
30.0000 mg | Freq: Every day | SUBCUTANEOUS | Status: DC
  Administered 2020-03-19: 09:00:00 30 mg via SUBCUTANEOUS
  Filled 2020-03-19: qty 0.3

## 2020-03-19 MED ORDER — ALPRAZOLAM 0.25 MG PO TABS
0.2500 mg | ORAL_TABLET | Freq: Two times a day (BID) | ORAL | Status: DC | PRN
  Administered 2020-03-19: 10:00:00 0.25 mg via ORAL
  Filled 2020-03-19: qty 1

## 2020-03-19 MED ORDER — PANTOPRAZOLE SODIUM 40 MG PO TBEC
40.0000 mg | DELAYED_RELEASE_TABLET | Freq: Two times a day (BID) | ORAL | Status: DC
  Administered 2020-03-19 – 2020-03-21 (×5): 40 mg via ORAL
  Filled 2020-03-19 (×5): qty 1

## 2020-03-19 MED ORDER — ALPRAZOLAM 0.25 MG PO TABS
0.2500 mg | ORAL_TABLET | Freq: Three times a day (TID) | ORAL | Status: DC | PRN
  Administered 2020-03-19 – 2020-03-21 (×2): 0.25 mg via ORAL
  Filled 2020-03-19 (×2): qty 1

## 2020-03-19 MED ORDER — GABAPENTIN 100 MG PO CAPS
100.0000 mg | ORAL_CAPSULE | Freq: Three times a day (TID) | ORAL | Status: DC
  Filled 2020-03-19 (×3): qty 1

## 2020-03-19 MED ORDER — CEFAZOLIN SODIUM-DEXTROSE 2-4 GM/100ML-% IV SOLN
2.0000 g | Freq: Three times a day (TID) | INTRAVENOUS | Status: DC
  Administered 2020-03-19 – 2020-03-21 (×7): 2 g via INTRAVENOUS
  Filled 2020-03-19 (×10): qty 100

## 2020-03-19 MED ORDER — FERROUS SULFATE 325 (65 FE) MG PO TABS
325.0000 mg | ORAL_TABLET | Freq: Every day | ORAL | Status: DC
  Administered 2020-03-19 – 2020-03-21 (×3): 325 mg via ORAL
  Filled 2020-03-19 (×3): qty 1

## 2020-03-19 MED ORDER — BUDESONIDE 0.5 MG/2ML IN SUSP
1.0000 mg | RESPIRATORY_TRACT | Status: DC | PRN
  Filled 2020-03-19: qty 4

## 2020-03-19 MED ORDER — ACETAMINOPHEN 325 MG PO TABS
650.0000 mg | ORAL_TABLET | ORAL | Status: DC | PRN
  Administered 2020-03-19 – 2020-03-21 (×6): 650 mg via ORAL
  Filled 2020-03-19 (×6): qty 2

## 2020-03-19 MED ORDER — ADULT MULTIVITAMIN W/MINERALS CH
1.0000 | ORAL_TABLET | Freq: Every day | ORAL | Status: DC
  Administered 2020-03-19 – 2020-03-21 (×3): 1 via ORAL
  Filled 2020-03-19 (×3): qty 1

## 2020-03-19 MED ORDER — TRAZODONE HCL 50 MG PO TABS
50.0000 mg | ORAL_TABLET | Freq: Every evening | ORAL | Status: DC | PRN
  Administered 2020-03-19: 50 mg via ORAL
  Filled 2020-03-19: qty 1

## 2020-03-19 MED ORDER — CALCIUM CARBONATE-VITAMIN D 500-200 MG-UNIT PO TABS
1.0000 | ORAL_TABLET | Freq: Every day | ORAL | Status: DC
  Administered 2020-03-20 – 2020-03-21 (×2): 1 via ORAL
  Filled 2020-03-19 (×3): qty 1

## 2020-03-19 MED ORDER — ONDANSETRON HCL 4 MG PO TABS: 4.0000 mg | ORAL_TABLET | Freq: Four times a day (QID) | ORAL | Status: DC | PRN

## 2020-03-19 MED ORDER — SIMPLE SYRUP PO SYRP: 5.0000 mL | ORAL_SOLUTION | ORAL | Status: DC | PRN

## 2020-03-19 MED ORDER — BUDESONIDE 0.5 MG/2ML IN SUSP
1.0000 mg | RESPIRATORY_TRACT | Status: DC | PRN
  Filled 2020-03-19 (×2): qty 4

## 2020-03-19 MED ORDER — SIMPLE SYRUP PO SYRP
5.0000 mL | ORAL_SOLUTION | ORAL | Status: DC | PRN
  Filled 2020-03-19: qty 5

## 2020-03-19 NOTE — ED Notes (Signed)
Pt returns from MRI. Reports she got really panicked during the MRI and will not be able to tolerate another one. Pt alert and oriented x4. VSS. Therapeutic communication, cool wash cloth, and sprite provided by this RN.

## 2020-03-19 NOTE — Progress Notes (Signed)
Pharmacy Antibiotic Note  Megan Cox is a 69 y.o. female admitted on 03/18/2020 with MSSA bacteremia.  Pharmacy has been consulted for continued Cefazolin dosing.  Pt on Cefazolin PTA for MSSA bacteremia. Old records are reviewed showing hospitalization for staph aureus bacteremia last month, and discussion with infectious disease specialist regarding positive blood culture yesterday. Dr. Linus Salmons, on-call for infectious disease, recommended taking the PICC line out, getting 2 additional blood cultures, admitting the patient for continuing IV cefazolin through peripheral IV for 2 days before reinserting PICC line  Plan: Cefazolin 2gm IV q8h Will f/u renal function, micro data, and pt's clinical condition    Height: 5\' 2"  (157.5 cm) Weight: 46.3 kg (102 lb) IBW/kg (Calculated) : 50.1  Temp (24hrs), Avg:98.2 F (36.8 C), Min:98.2 F (36.8 C), Max:98.2 F (36.8 C)  Recent Labs  Lab 03/19/20 0310  WBC 5.0  CREATININE 0.77    Estimated Creatinine Clearance: 49.2 mL/min (by C-G formula based on SCr of 0.77 mg/dL).    Allergies  Allergen Reactions  . Contrast Media [Iodinated Diagnostic Agents] Anaphylaxis, Shortness Of Breath and Swelling    PATIENT STATES THAT HER BROTHER HAD ANAPHYLAXIS WITH IV CONTRAST AND WANTS TO SHARE THIS FAMILY HISTORY. Brother died from reaction    Antimicrobials this admission: PTA Cefazolin >>   Microbiology results: 4/15 BCx:    Thank you for allowing pharmacy to be a part of this patient's care.  Sherlon Handing, PharmD, BCPS Please see amion for complete clinical pharmacist phone list 03/19/2020 5:55 AM

## 2020-03-19 NOTE — ED Notes (Signed)
MRI reports they will be ready for the patient in 30 minutes. Pt given dose of PO xanax at this time for MRI.

## 2020-03-19 NOTE — Progress Notes (Signed)
PROGRESS NOTE  Brief Narrative: Megan Cox is a 69 y.o. female with a history of eosinophilic esophagitis and recent admission for cervical paravertebral phlegmon, osteomyelitis with MSSA bacteremia discharged on ancef through PICC on 02/25/2020 planned through 04/03/2020. She reported some intermittent night sweats, though had improvement in neck pain since that time. Blood cultures were repeated, drawn by home health RN, and reportedly grew S. aureus so the patient was referred to the ED 4/14. WBC is normal, afebrile. PICC line was removed and ancef has been continued. MRI of the cervical spine is ordered and ID consult requested. Patient was admitted this morning by Dr. Hal Hope.  Subjective: Confirms night sweats intermittently but no pain at PICC site, no bleeding, tenderness or discharge from it. No new rashes. Denies respiratory complaints, no numbness or weakness in arms bilaterally.   Objective: BP 119/82   Pulse 86   Temp 98.2 F (36.8 C) (Oral)   Resp 17   Ht 5\' 2"  (1.575 m)   Wt 46.3 kg   SpO2 97%   BMI 18.66 kg/m   Gen: Nontoxic, well-appearing female in no distress Pulm: Clear and nonlabored on room air. Tenderness to chest wall palpation.  CV: RRR, no murmur, no JVD, no edema GI: Soft, NT, ND, +BS  Neuro: Alert and oriented. No focal deficits in upper extremities. No midline cervical or thoracic tenderness to palpation. Skin: No rashes, lesions or ulcers. RUE PICC site nontender without erythema, bleeding, discharge.  Assessment & Plan: Persistent MSSA bacteremia with cervical spine phlegmon/small abscesses: WBC normal.  - PICC pulled last night - Continue ancef - Repeat MRI cervical spine to reevaluate ventral epidural phlegmon, vertebral osteomyelitis. - Blood cultures repeated this AM - ID, Dr. Linus Salmons consulted.   Left chest tenderness: Hurts with palpation, not pleuritic or exertional. No other evidence of VTE (tachycardia, hypoxemia, leg swelling) despite  elevated d-dimer which could be related to bacteremia.  - Continue monitoring with supportive therapy.  Eosinophilic esophagitis:  - Continue PPI, budesonide solution per home routine qHS.  Anxiety:  - Continue xanax, trazodone. Will give additional xanax due to MRI-related anxiety.   Anemia: Presumably due to acute severe illness causing marrow suppression. Improved since last check. No bleeding.  - Monitor  Patrecia Pour, MD Pager on amion 03/19/2020, 9:02 AM

## 2020-03-19 NOTE — Progress Notes (Signed)
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 This is possibly due to burden of infection, although cervical pain is improved.  PICC line has been removed and awaiting follow up MRI. Her chest pain is concerning for possibility of endocarditis despite previous TTE being negative. Will need to determine if TEE feasable as she is at risk for esphogeal tears per her report. Blood cultures are in process and currently clinically stable. Continue current dose of Cefazolin pending additional work up.   PLAN:  1. Continue Cefazolin. 2. Monitor cultures for clearance of bacteremia. 3. MRI re-imaging of cervical spine.  4. Consider TEE with possibility of endocarditis.   Principal Problem:   Bacteremia Active Problems:   Anemia   Eosinophilic esophagitis   . calcium-vitamin D  1 tablet Oral Daily  . enoxaparin (LOVENOX) injection  30 mg Subcutaneous Daily  . ferrous sulfate  325 mg Oral Daily  . gabapentin  100 mg Oral TID  . multivitamin with minerals  1 tablet Oral Daily  . pantoprazole  40 mg Oral BID AC  . [START ON 03/25/2020] vitamin B-12  1,250 mcg Oral Q7 days     HPI: Megan Cox is a 69 y.o. female with previous medical history of depression advised to come to the hospital secondary to positive blood cultures for MSSA.  Megan Cox was initially seen by the ID team on 02/22/2020 have neck pain since December 2020 who subsequently developed fever and chills.  MRI showed extensive cervical osteomyelitis which was complicated by MSSA bacteremia.  Cultures were cleared rapidly with no evidence of endocarditis on TTE  with preserved heart valve function and structure.  Outpatient antibiotic orders placed with goal of 6 weeks of IV cefazolin via PICC line with end date of 04/03/2020. Megan Cox contacted the ID office with concern for night sweats on 03/05/2020.  Recommended to obtain blood cultures to ensure clearance of infection.  On 03/17/2020 ID office made aware of blood culture from 4/6 being positive for MSSA infection.  On 03/18/2020 she was advised that her blood cultures were positive and asked to go to the emergency room for further evaluation.  In the ED Megan Cox was afebrile with no significant leukocytosis.  She was continued on cefazolin.  Blood cultures drawn are currently pending.  Scheduled for MRI.  PICC line has been removed.  Megan Cox has noted improvement in her cervical spine pain over the last several days but continues to have pain located on the left side of her chest that has been there since her previous discharge. There were times it was on the right side as well. She has been having loose bowel movements since starting on the Ancef. No fevers at home.   Review of Systems: Review of Systems  Constitutional: Negative for chills, fever and weight loss.  Respiratory: Negative for cough, shortness of breath and wheezing.   Cardiovascular: Positive for chest pain. Negative for leg swelling.  Gastrointestinal: Positive for diarrhea. Negative for abdominal pain, constipation, nausea and vomiting.  Skin: Negative for rash.  Past Medical History:  Diagnosis Date  . Depression     Social History   Tobacco Use  . Smoking status: Never Smoker  . Smokeless tobacco: Never Used  Substance Use Topics  . Alcohol use: No  . Drug use: Never    Family History  Problem Relation Age of Onset  . Breast cancer Mother 76    Allergies  Allergen Reactions  . Contrast Media [Iodinated Diagnostic Agents] Anaphylaxis, Shortness Of Breath and Swelling    PATIENT STATES THAT HER BROTHER HAD  ANAPHYLAXIS WITH IV CONTRAST AND WANTS TO SHARE THIS FAMILY HISTORY. Brother died from reaction    OBJECTIVE: Blood pressure 132/68, pulse 75, temperature 98.2 F (36.8 C), temperature source Oral, resp. rate 17, height 5\' 2"  (1.575 m), weight 46.3 kg, SpO2 98 %.  Physical Exam Constitutional:      General: She is not in acute distress.    Appearance: She is well-developed.     Comments: Lying bed with head of bed elevated; pleasant.   Cardiovascular:     Rate and Rhythm: Normal rate and regular rhythm.     Heart sounds: Normal heart sounds.  Pulmonary:     Effort: Pulmonary effort is normal.     Breath sounds: Normal breath sounds.  Skin:    General: Skin is warm and dry.  Neurological:     Mental Status: She is alert and oriented to person, place, and time.  Psychiatric:        Behavior: Behavior normal.        Thought Content: Thought content normal.        Judgment: Judgment normal.     Lab Results Lab Results  Component Value Date   WBC 5.4 03/19/2020   HGB 11.0 (L) 03/19/2020   HCT 35.2 (L) 03/19/2020   MCV 92.9 03/19/2020   PLT 324 03/19/2020    Lab Results  Component Value Date   CREATININE 0.87 03/19/2020   BUN 11 03/19/2020   NA 138 03/19/2020   K 3.7 03/19/2020   CL 103 03/19/2020   CO2 25 03/19/2020    Lab Results  Component Value Date   ALT 22 02/23/2020   AST 26 02/23/2020   ALKPHOS 92 02/23/2020   BILITOT 0.8 02/23/2020     Microbiology: Recent Results (from the past 240 hour(s))  SARS Coronavirus 2 by RT PCR     Status: None   Collection Time: 03/19/20  4:37 AM  Result Value Ref Range Status   SARS Coronavirus 2 NEGATIVE NEGATIVE Final    Comment: (NOTE) Result indicates the ABSENCE of SARS-CoV-2 RNA in the patient specimen.  The lowest concentration of SARS-CoV-2 viral copies this assay can detect in nasopharyngeal swab specimens is 500 copies / mL.  A negative result does not preclude SARS-CoV-2 infection and should not be used as  the sole basis for patient management decisions. A negative result may occur with improper specimen collection / handling, submission of a specimen other than nasopharyngeal swab, presence of viral mutation(s) within the areas targeted by this assay, and inadequate number of viral copies (<500 copies / mL) present.  Negative results must be combined with clinical observations, patient history, and epidemiological information.  The expected result is NEGATIVE.  Patient Fact Sheet:  BlogSelections.co.uk   Provider Fact Sheet:  https://lucas.com/   This test is not yet approved or cleared by the [place] FDA and  has been authorized for  detection and/or diagnosis of SARS-CoV-2 by FDA under an  Emergency Use Authorization (EUA).  This EUA will remain in effect (meaning this test can be used) for the duration of  the COVID-19 declaration under Section 564(b)(1) of the Act, 21 U.S.C. section 360bbb-3(b)(1), unless the authorization is terminated or revoked sooner Performed at [facility], [place] [address], [place], [place] [address]      Terri Piedra, [facility]  03/19/2020  10:16 AM

## 2020-03-19 NOTE — ED Notes (Signed)
Lunch Tray Ordered@ 1025. 

## 2020-03-19 NOTE — Progress Notes (Signed)
  Echocardiogram 2D Echocardiogram has been attempted. Patient being moved off the floor. Will reattempt at later time.  Megan Cox G Markeda Narvaez 03/19/2020, 3:20 PM

## 2020-03-19 NOTE — Progress Notes (Signed)
NEW ADMISSION NOTE New Admission Note:   Arrival Method: Patient arrived from ED Mental Orientation: Alert and oriented x 4. Telemetry: 37M-09 NSR Assessment: Completed Skin: Warm dry and intact. IV:  Left FA, SL. Pain: Denies any pain. Tubes: N/A Safety Measures: Safety Fall Prevention Plan has been given, discussed and signed Admission: Completed [facility] Orientation: Patient has been orientated to the room, unit and staff.  Family: NOne.  Orders have been reviewed and implemented. Will continue to monitor the patient. Call light has been placed within reach and bed alarm has been activated.   Amaryllis Dyke, RN

## 2020-03-19 NOTE — H&P (Signed)
History and Physical    ALEIGHNA WOJTAS JOI:[id number] DOB: 1950-12-21 DOA: 03/18/2020  PCP: Lavone Orn, MD  Patient coming from: Home.  Chief Complaint: Bacteremia.  HPI: AMIRACLE NEISES is a 69 y.o. female with history of eosinophilic esophagitis who was recently admitted and discharged on February 25, 2020 for prevertebral abscess/phlegmon treated empirically with IV Ancef blood cultures grew MSSA at that time and is supposed to be on IV antibiotics on PICC line for next few more weeks until April 03, 2020 had blood cultures drawn on last week and grew staph aureus details of which are not completely available and patient was instructed to come to the ER.  Patient states since her discharge she has been having some left-sided pleuritic type of chest pain with no rash no exertional symptoms or shortness of breath.  ED Course: In the ER ER physician contacted on-call infectious disease consultant who advised the PICC line to be removed and continue Ancef for another 2 days through IV and to at the time place PICC line again.  Repeat blood cultures were ordered.  Labs show hemoglobin 11.1 which is improved from 7.23 weeks ago rest of the labs are unremarkable.  Chest x-ray showed nothing acute.  Covid test is pending.  Review of Systems: As per HPI, rest all negative.   Past Medical History:  Diagnosis Date  . Depression     Past Surgical History:  Procedure Laterality Date  . ESOPHAGOGASTRODUODENOSCOPY  2012  . ESOPHAGOGASTRODUODENOSCOPY N/A 09/22/2013   Procedure: ESOPHAGOGASTRODUODENOSCOPY (EGD);  Surgeon: Lear Ng, MD;  Location: [facility];  Service: Endoscopy;  Laterality: N/A;  . OVARIAN CYST REMOVAL  1973  . TUBAL LIGATION       reports that she has never smoked. She has never used smokeless tobacco. She reports that she does not drink alcohol or use drugs.  Allergies  Allergen Reactions  . Contrast Media [Iodinated Diagnostic Agents] Anaphylaxis, Shortness Of  Breath and Swelling    PATIENT STATES THAT HER BROTHER HAD ANAPHYLAXIS WITH IV CONTRAST AND WANTS TO SHARE THIS FAMILY HISTORY. Brother died from reaction    Family History  Problem Relation Age of Onset  . Breast cancer Mother 52    Prior to Admission medications   Medication Sig Start Date End Date Taking? Authorizing Provider  acetaminophen (TYLENOL) 650 MG CR tablet Take 1,300 mg by mouth every 8 (eight) hours as needed for pain or fever.    Yes [provider]  ALPRAZolam (XANAX) 0.25 MG tablet Take 0.25 mg by mouth 2 (two) times daily as needed for anxiety or sleep.    Yes [provider]  budesonide (PULMICORT) 0.5 MG/2ML nebulizer solution Take 1 mg by nebulization See admin instructions. Mix 1 mg (2 ampules) into 5 ml's of sugar-free simple syrup and drink as needed for symptoms of Eosinophilic esophagitis   Yes [provider]  Calcium Carbonate-Vitamin D 600-400 MG-UNIT chew tablet Chew 1 tablet by mouth daily.   Yes [provider]  Calcium Citrate-Vitamin D (CALCIUM CITRATE + D PO) Take 2 tablets by mouth.   Yes [provider]  Camphor-Menthol-Methyl Sal (SALONPAS) 3.12-10-08 % PTCH Apply 2 patches topically See admin instructions. Apply 1 patch neck and chest wall as needed for pain.   Yes [provider]  ceFAZolin (ANCEF) IVPB Inject 2 g into the vein every 8 (eight) hours. Indication: MSSA bacteremia Last Day of Therapy: 04/03/2020 Labs - Once weekly:  CBC/D and BMP, Labs -  Every other week:  ESR and CRP 02/25/20 04/04/20 Yes Gherghe, Vella Redhead, MD  Cyanocobalamin (VITAMIN B-12) 2500 MCG SUBL Place 1,250 mcg under the tongue every 7 (seven) days.    Yes [provider]  Ferrous Sulfate Dried (HIGH POTENCY IRON) 65 MG TABS Take 65 mg by mouth daily.    Yes [provider]  gabapentin (NEURONTIN) 100 MG capsule Take 100 mg by mouth 3 (three) times daily.   Yes [provider]  Ginkgo Biloba 120 MG TABS  Take 120 mg by mouth daily.   Yes [provider]  loperamide (IMODIUM) 2 MG capsule Take 2 mg by mouth as needed for diarrhea or loose stools.   Yes [provider]  Misc Natural Products (GLUCOSAMINE CHONDROITIN ADV PO) Take 2 capsules by mouth in the morning and at bedtime.   Yes [provider]  Multiple Vitamins-Minerals (DAILY MULTIVITAMIN) CAPS Take 1 capsule by mouth daily.   Yes [provider]  pantoprazole (PROTONIX) 40 MG tablet Take 40 mg by mouth 2 (two) times daily before a meal.   Yes [provider]  Polyethyl Glycol-Propyl Glycol (SYSTANE HYDRATION PF OP) Place 1 drop into both eyes 3 (three) times daily as needed (for dry eyes, when not using Blink Tears).    Yes [provider]  Polyethylene Glycol 400 (BLINK TEARS) 0.25 % SOLN Place 1 drop into both eyes 3 (three) times daily as needed (for dry eyes, when not using Systane).    Yes [provider]  Soy Isoflavone 40 MG TABS Take 80 mg by mouth daily.    Yes [provider]  traZODone (DESYREL) 50 MG tablet Patient takes 50 mg in the am and 100 mg at bedtime Patient taking differently: Take 50-100 mg by mouth See admin instructions. Take 50 mg by mouth in the morning and 100 mg at bedtime 09/25/13  Yes Lars Pinks M, PA-C  TURMERIC PO Take 2 capsules by mouth daily.    Yes [provider]    Physical Exam: Constitutional: Moderately built and nourished. Vitals:   03/18/20 1833 03/19/20 0052  BP: (!) 142/63 137/69  Pulse: 90 (!) 101  Resp: 16 17  Temp: 98.2 F (36.8 C)   TempSrc: Oral   SpO2: 97% 99%  Weight: 46.3 kg   Height: 5' 2" (1.575 m)    Eyes: Anicteric no pallor. ENMT: No discharge from the ears eyes nose or mouth. Neck: No mass felt.  No neck rigidity. Respiratory: No rhonchi or crepitations. Cardiovascular: S1-S2 heard. Abdomen: Soft nontender bowel sounds present. Musculoskeletal: No edema. Skin: No  rash. Neurologic: Alert awake oriented to time place and person.  Moves all extremities. Psychiatric: Appears normal per normal affect.   Labs on Admission: I have personally reviewed following labs and imaging studies  CBC: Recent Labs  Lab 03/19/20 0310  WBC 5.0  NEUTROABS 2.2  HGB 11.1*  HCT 35.9*  MCV 92.1  PLT 381   Basic Metabolic Panel: Recent Labs  Lab 03/19/20 0310  NA 138  K 3.7  CL 103  CO2 25  GLUCOSE 107*  BUN 11  CREATININE 0.77  CALCIUM 9.3   GFR: Estimated Creatinine Clearance: 49.2 mL/min (by C-G formula based on SCr of 0.77 mg/dL). Liver Function Tests: No results for input(s): AST, ALT, ALKPHOS, BILITOT, PROT, ALBUMIN in the last 168 hours. No results for input(s): LIPASE, AMYLASE in the last 168 hours. No results for input(s): AMMONIA in the last 168 hours. Coagulation  Profile: No results for input(s): INR, PROTIME in the last 168 hours. Cardiac Enzymes: No results for input(s): CKTOTAL, CKMB, CKMBINDEX, TROPONINI in the last 168 hours. BNP (last 3 results) No results for input(s): PROBNP in the last 8760 hours. HbA1C: No results for input(s): HGBA1C in the last 72 hours. CBG: No results for input(s): GLUCAP in the last 168 hours. Lipid Profile: No results for input(s): CHOL, HDL, LDLCALC, TRIG, CHOLHDL, LDLDIRECT in the last 72 hours. Thyroid Function Tests: No results for input(s): TSH, T4TOTAL, FREET4, T3FREE, THYROIDAB in the last 72 hours. Anemia Panel: No results for input(s): VITAMINB12, FOLATE, FERRITIN, TIBC, IRON, RETICCTPCT in the last 72 hours. Urine analysis:    Component Value Date/Time   COLORURINE YELLOW 02/21/2020 1044   APPEARANCEUR CLEAR 02/21/2020 1044   LABSPEC 1.010 02/21/2020 1044   PHURINE 7.0 02/21/2020 1044   GLUCOSEU NEGATIVE 02/21/2020 1044   HGBUR NEGATIVE 02/21/2020 1044   BILIRUBINUR NEGATIVE 02/21/2020 1044   [place] 02/21/2020 1044   PROTEINUR NEGATIVE 02/21/2020 1044   NITRITE NEGATIVE  02/21/2020 1044   LEUKOCYTESUR NEGATIVE 02/21/2020 1044   Sepsis Labs: _0 (procalcitonin:4,lacticidven:4) )No results found for this or any previous visit (from the past 240 hour(s)).   Radiological Exams on Admission: DG CHEST PORT 1 VIEW  Result Date: 03/19/2020 CLINICAL DATA:  69 year old female with history of positive blood cultures. EXAM: PORTABLE CHEST 1 VIEW COMPARISON:  Chest x-ray 02/21/2020. FINDINGS: Lung volumes are normal. No consolidative airspace disease. No pleural effusions. No pneumothorax. No pulmonary nodule or mass noted. Pulmonary vasculature and the cardiomediastinal silhouette are within normal limits. IMPRESSION: No radiographic evidence of acute cardiopulmonary disease. Electronically Signed   By: Vinnie Langton M.D.   On: 03/19/2020 05:35      Assessment/Plan Principal Problem:   Bacteremia Active Problems:   Anemia   Eosinophilic esophagitis    1. Bacteremia with staph aureus exact details of the blood cultures had to be obtained.  Will need infectious disease consult.  Patient's PICC line was removed and patient is started on IV Ancef through peripheral line.  Follow cultures.  Patient was recently admitted for prevertebral abscess phlegmon with MSSA bacteremia. 2. Left-sided pleuritic chest pain.  Chest x-ray unremarkable will check EKG cardiac markers and D-dimer.  Based on which will have further plans. 3. Eosinophilic esophagitis on Protonix and budesonide. 4. Anxiety on Xanax and trazodone. 5. Chronic anemia has improved from recent past on B12 and folate.  Given that patient has bacteremia will need close monitoring for any deterioration and will need inpatient status.  Covid test is pending.   DVT prophylaxis: Lovenox. Code Status: Full code. Family Communication: Discussed with patient. Disposition Plan: Home. Consults called: ER physician discussed with infectious disease consultant. Admission status: Inpatient.   Rise Patience MD [facility] Pager [phone].  If 7PM-7AM, please contact night-coverage [email or web address] Password [facility]  03/19/2020, 5:53 AM

## 2020-03-19 NOTE — Progress Notes (Signed)
  Echocardiogram 2D Echocardiogram has been performed.  Megan Cox 03/19/2020, 4:43 PM

## 2020-03-19 NOTE — ED Provider Notes (Signed)
[facility] DEPART[facility] Provider Note   CSN: [id number] Arrival date & time: 03/18/20  1801   History Chief Complaint  Patient presents with  . Vascular Access Problem  . Diarrhea    Megan Cox is a 68 y.o. female.  The history is provided by the patient.  Diarrhea She is sent to the emergency department because of a positive blood culture.  She has been treated for staph aureus bacteremia with cefazolin infusions through a PICC line.  Blood cultures drawn on April 6 did show methicillin sensitive staph aureus.  Her infectious disease physician was concerned about this.  It was not clear whether it was an infected PICC line versus treatment failure.  Patient denies fever chills or sweats and generally feels well.  She has had problems with loose stools and did send a specimen for C. difficile testing yesterday, results are supposed to be available tomorrow.  She did take a dose of loperamide and has not had any diarrhea since then.  Past Medical History:  Diagnosis Date  . Depression     Patient Active Problem List   Diagnosis Date Noted  . Bacteremia due to methicillin susceptible Staphylococcus aureus (MSSA) 02/24/2020  . Cellulitis and abscess of neck 02/21/2020  . Eosinophilic esophagitis 62/94/7654  . Depression   . Sepsis ([place])   . Anemia   . Abscess in epidural space of cervical spine   . Dysphagia 09/22/2013  . Foreign body in esophagus 09/22/2013    Past Surgical History:  Procedure Laterality Date  . ESOPHAGOGASTRODUODENOSCOPY  2012  . ESOPHAGOGASTRODUODENOSCOPY N/A 09/22/2013   Procedure: ESOPHAGOGASTRODUODENOSCOPY (EGD);  Surgeon: Lear Ng, MD;  Location: [facility];  Service: Endoscopy;  Laterality: N/A;  . OVARIAN CYST REMOVAL  1973  . TUBAL LIGATION       OB History   No obstetric history on file.     Family History  Problem Relation Age of Onset  . Breast cancer Mother 44    Social History   Tobacco  Use  . Smoking status: Never Smoker  . Smokeless tobacco: Never Used  Substance Use Topics  . Alcohol use: No  . Drug use: Never    Home Medications Prior to Admission medications   Medication Sig Start Date End Date Taking? Authorizing Provider  acetaminophen (TYLENOL) 650 MG CR tablet Take 1,300 mg by mouth every 8 (eight) hours as needed for pain or fever.     [provider]  ALPRAZolam Duanne Moron) 0.25 MG tablet Take 0.25 mg by mouth 2 (two) times daily as needed for anxiety or sleep.     [provider]  budesonide (PULMICORT) 0.5 MG/2ML nebulizer solution Take 1 mg by nebulization See admin instructions. Mix 1 mg (2 ampules) into 5 ml's of sugar-free simple syrup and drink as needed for symptoms of Eosinophilic esophagitis    [provider]  ceFAZolin (ANCEF) IVPB Inject 2 g into the vein every 8 (eight) hours. Indication: MSSA bacteremia Last Day of Therapy: 04/03/2020 Labs - Once weekly:  CBC/D and BMP, Labs - Every other week:  ESR and CRP 02/25/20 04/04/20  Gherghe, Vella Redhead, MD  Cyanocobalamin (VITAMIN B-12) 2500 MCG SUBL Place 1,250 mcg under the tongue every 7 (seven) days.     [provider]  diphenhydrAMINE (BENADRYL) 25 MG tablet Take 25 mg by mouth every 6 (six) hours as needed for itching.    [provider]  Ferrous Sulfate Dried (HIGH POTENCY IRON) 65 MG  TABS Take 65 mg by mouth 3 (three) times a week.     [provider]  gabapentin (NEURONTIN) 100 MG capsule Take 100 mg by mouth 3 (three) times daily.    [provider]  Ginkgo Biloba 120 MG TABS Take 120 mg by mouth daily.    [provider]  methocarbamol (ROBAXIN) 500 MG tablet Take 500 mg by mouth in the morning, at noon, and at bedtime. 02/20/20   [provider]  Misc Natural Products (Lydia MSM FORMULA) TABS Take 1 tablet by mouth in the morning and at bedtime.    [provider]  Multiple Vitamin (MULTIVITAMIN WITH  MINERALS) TABS tablet Take 1 tablet by mouth daily.    [provider]  pantoprazole (PROTONIX) 40 MG tablet Take 40 mg by mouth 2 (two) times daily before a meal.    [provider]  Polyethyl Glycol-Propyl Glycol (SYSTANE HYDRATION PF OP) Place 1 drop into both eyes 3 (three) times daily as needed (for dry eyes, when not using Blink Tears).     [provider]  Polyethylene Glycol 400 (BLINK TEARS) 0.25 % SOLN Place 1 drop into both eyes 3 (three) times daily as needed (for dry eyes, when not using Systane).     [provider]  Soy Isoflavone 40 MG TABS Take 80 mg by mouth daily.     [provider]  traZODone (DESYREL) 50 MG tablet Patient takes 50 mg in the am and 100 mg at bedtime Patient taking differently: Take 50-100 mg by mouth See admin instructions. Take 50 mg by mouth in the morning and 100 mg at bedtime 09/25/13   Lars Pinks M, PA-C  TURMERIC PO Take 1 capsule by mouth daily.    [provider]    Allergies    Contrast media [iodinated diagnostic agents]  Review of Systems   Review of Systems  Gastrointestinal: Positive for diarrhea.  All other systems reviewed and are negative.   Physical Exam Updated Vital Signs BP 137/69 (BP Location: Right Arm)   Pulse (!) 101   Temp 98.2 F (36.8 C) (Oral)   Resp 17   Ht 5' 2"  (1.575 m)   Wt 46.3 kg   SpO2 99%   BMI 18.66 kg/m   Physical Exam Vitals and nursing note reviewed.   69 year old female, resting comfortably and in no acute distress. Vital signs are significant for borderline elevated heart rate. Oxygen saturation is 99%, which is normal. Head is normocephalic and atraumatic. PERRLA, EOMI. Oropharynx is clear. Neck is nontender and supple without adenopathy or JVD. Back is nontender and there is no CVA tenderness. Lungs are clear without rales, wheezes, or rhonchi. Chest is nontender. Heart has regular rate and rhythm without murmur. Abdomen is soft,  flat, nontender without masses or hepatosplenomegaly and peristalsis is normoactive. Extremities have no cyanosis or edema, full range of motion is present.  PICC line present in the right upper arm with no erythema of the skin, no swelling, no warmth. Skin is warm and dry without rash. Neurologic: Mental status is normal, cranial nerves are intact, there are no motor or sensory deficits.  ED Results / Procedures / Treatments   Labs (all labs ordered are listed, but only abnormal results are displayed) Labs Reviewed - No data to display  Procedures Procedures   Medications Ordered in ED Medications - No data to display  ED Course  I have reviewed the triage vital signs and the  nursing notes.  Pertinent lab results that were available during my care of the patient were reviewed by me and considered in my medical decision making (see chart for details).  Staff aureus bacteremia.  Old records are reviewed showing hospitalization for staph aureus bacteremia last month, and discussion with infectious disease specialist regarding positive blood culture yesterday.  I have discussed the case with Dr. Linus Salmons, on-call for infectious disease, who recommends taking the PICC line out, getting 2 additional blood cultures, admitting the patient for continuing IV cefazolin through peripheral IV for 2 days before reinserting PICC line.  Screening labs are drawn.  Hemoglobin is slightly low at 11.1, which is actually significant increase over baseline. Metabolic panel is normal. Case is discussed with Dr. Hal Hope of [facility], who agrees to admit the patient.  MDM Rules/Calculators/A&P  Final Clinical Impression(s) / ED Diagnoses Final diagnoses:  Staphylococcus aureus bacteremia  Normochromic normocytic anemia    Rx / DC Orders ED Discharge Orders    None       Delora Fuel, MD 46/27/03 [phone]

## 2020-03-19 NOTE — ED Notes (Signed)
Per MD xanax order changed to TID. Okay to give patient additional dose of 0.25mg  at this time.

## 2020-03-20 DIAGNOSIS — M4620 Osteomyelitis of vertebra, site unspecified: Secondary | ICD-10-CM

## 2020-03-20 DIAGNOSIS — M4640 Discitis, unspecified, site unspecified: Secondary | ICD-10-CM | POA: Diagnosis not present

## 2020-03-20 DIAGNOSIS — K921 Melena: Secondary | ICD-10-CM | POA: Diagnosis not present

## 2020-03-20 DIAGNOSIS — R7881 Bacteremia: Secondary | ICD-10-CM | POA: Diagnosis not present

## 2020-03-20 DIAGNOSIS — D649 Anemia, unspecified: Secondary | ICD-10-CM

## 2020-03-20 LAB — SEDIMENTATION RATE: Sed Rate: 68 mm/hr — ABNORMAL HIGH (ref 0–22)

## 2020-03-20 LAB — C-REACTIVE PROTEIN: CRP: 0.8 mg/dL (ref <1.0)

## 2020-03-20 MED ORDER — TRAZODONE HCL 100 MG PO TABS
100.0000 mg | ORAL_TABLET | Freq: Every day | ORAL | Status: DC
  Administered 2020-03-20: 100 mg via ORAL
  Filled 2020-03-20: qty 1

## 2020-03-20 MED ORDER — CEFAZOLIN IV (FOR PTA / DISCHARGE USE ONLY): 2.0000 g | Freq: Three times a day (TID) | INTRAVENOUS | 0 refills | Status: DC

## 2020-03-20 MED ORDER — TRAZODONE HCL 50 MG PO TABS
50.0000 mg | ORAL_TABLET | Freq: Every day | ORAL | Status: DC
  Administered 2020-03-21: 50 mg via ORAL
  Filled 2020-03-20: qty 1

## 2020-03-20 NOTE — Progress Notes (Signed)
PROGRESS NOTE  Megan Cox  [id number] DOB: [birth year] DOA: 03/18/2020 PCP: Lavone Orn, MD   Brief Narrative: Megan Cox is a 69 y.o. female with a history of eosinophilic esophagitis and recent admission for cervical paravertebral phlegmon, osteomyelitis with MSSA bacteremia discharged on ancef through PICC on 02/25/2020 planned through 04/03/2020. She reported some intermittent night sweats, though had improvement in neck pain since that time. Blood cultures were repeated, drawn by home health RN, and reportedly grew S. aureus so the patient was referred to the ED 4/14. WBC is normal, afebrile. PICC line was removed and ancef has been continued. MRI of the cervical spine showed regression of infection on previous imaging with new areas of thoracic infection. See below for full details. Repeat blood cultures have remained no growth to date.   Assessment & Plan: Principal Problem:   Bacteremia Active Problems:   Anemia   Eosinophilic esophagitis  Persistent MSSA bacteremia with discitis/osteomyelitis:  - Continue ancef, per ID planning 8 additional weeks from now through June 9 with subsequent conversion to keflex. Could also transition as early as May 26 depending on course.  - Monitor repeat blood Cx's (NGTD) - Plan to insert PICC 4/17 if remains no growth and then DC on ancef.  - No vegetations on TTE.   Left chest tenderness: Hurts with palpation, not pleuritic or exertional. No other evidence of VTE (tachycardia, hypoxemia, leg swelling) despite elevated d-dimer which could be related to bacteremia.  - Continue monitoring with supportive therapy.  Blood in stool: Very minimal, has stopped. Perhaps related to hemorrhoid.  - Continue monitoring.   Eosinophilic esophagitis:  - Continue PPI, budesonide solution per home routine qHS.  Anxiety:  - Continue xanax, trazodone (50mg  qAM, 100mg  qHS).   PTSD:  - SEVERE: Patient would require general anesthesia for any  additional MRI exams.   Anemia: Presumably due to acute severe illness causing marrow suppression. Improved since last check. No bleeding.  - Monitor, stable. Continue iron supplement.   DVT prophylaxis: Lovenox Code Status: Full Family Communication: Sister at bedside Disposition Plan:  Status is: Inpatient  Remains inpatient appropriate because:Ongoing diagnostic testing needed not appropriate for outpatient work up   Dispo: The patient is from: Home              Anticipated d/c is to: Home              Anticipated d/c date is: 1 day              Patient currently is not medically stable to d/c.  Consultants:   ID  Procedures:   None  Antimicrobials:  Ancef   Subjective: Had some small red blood on TP this AM after BM, had another with some red blood and a third without any blood noted. No abd pain, other bleeding/bruising. No fevers currently but id have night sweats last night again.  Objective: Vitals:   03/19/20 2016 03/20/20 0059 03/20/20 0359 03/20/20 0918  BP: 131/78 117/67 123/73 140/70  Pulse: 88 71 76 79  Resp: 16 18 16 18   Temp: 98.6 F (37 C) 98.8 F (37.1 C) 98.7 F (37.1 C) 98.5 F (36.9 C)  TempSrc: Oral Oral Oral Oral  SpO2: 97% 95% 94% 99%  Weight: 45.5 kg     Height:        Intake/Output Summary (Last 24 hours) at 03/20/2020 1244 Last data filed at 03/20/2020 0900 Gross per 24 hour  Intake 740.06 ml  Output 200  ml  Net 540.06 ml   Filed Weights   03/18/20 1833 03/19/20 1515 03/19/20 2016  Weight: 46.3 kg 45.5 kg 45.5 kg   Gen: 69 y.o. female in no distress  Pulm: Non-labored breathing room air. Clear to auscultation bilaterally.  CV: Regular rate and rhythm. No murmur, rub, or gallop. No JVD, no pedal edema. GI: Abdomen soft, non-tender, non-distended, with normoactive bowel sounds. No organomegaly or masses felt. Ext: Warm, no deformities Skin: No rashes, lesions or ulcers Neuro: Alert and oriented. No focal neurological  deficits. Psych: Judgement and insight appear normal. Mood & affect appropriate.   Data Reviewed: I have personally reviewed following labs and imaging studies  CBC: Recent Labs  Lab 03/19/20 0310 03/19/20 0813  WBC 5.0 5.4  NEUTROABS 2.2  --   HGB 11.1* 11.0*  HCT 35.9* 35.2*  MCV 92.1 92.9  PLT 339 0000000   Basic Metabolic Panel: Recent Labs  Lab 03/19/20 0310 03/19/20 0813  NA 138  --   K 3.7  --   CL 103  --   CO2 25  --   GLUCOSE 107*  --   BUN 11  --   CREATININE 0.77 0.87  CALCIUM 9.3  --    GFR: Estimated Creatinine Clearance: 44.5 mL/min (by C-G formula based on SCr of 0.87 mg/dL). Liver Function Tests: No results for input(s): AST, ALT, ALKPHOS, BILITOT, PROT, ALBUMIN in the last 168 hours. No results for input(s): LIPASE, AMYLASE in the last 168 hours. No results for input(s): AMMONIA in the last 168 hours. Coagulation Profile: No results for input(s): INR, PROTIME in the last 168 hours. Cardiac Enzymes: No results for input(s): CKTOTAL, CKMB, CKMBINDEX, TROPONINI in the last 168 hours. BNP (last 3 results) No results for input(s): PROBNP in the last 8760 hours. HbA1C: No results for input(s): HGBA1C in the last 72 hours. CBG: No results for input(s): GLUCAP in the last 168 hours. Lipid Profile: No results for input(s): CHOL, HDL, LDLCALC, TRIG, CHOLHDL, LDLDIRECT in the last 72 hours. Thyroid Function Tests: No results for input(s): TSH, T4TOTAL, FREET4, T3FREE, THYROIDAB in the last 72 hours. Anemia Panel: No results for input(s): VITAMINB12, FOLATE, FERRITIN, TIBC, IRON, RETICCTPCT in the last 72 hours. Urine analysis:    Component Value Date/Time   COLORURINE YELLOW 02/21/2020 1044   APPEARANCEUR CLEAR 02/21/2020 1044   LABSPEC 1.010 02/21/2020 1044   PHURINE 7.0 02/21/2020 [place] 02/21/2020 1044   HGBUR NEGATIVE 02/21/2020 1044   BILIRUBINUR NEGATIVE 02/21/2020 1044   [place] 02/21/2020 1044   PROTEINUR NEGATIVE  02/21/2020 1044   NITRITE NEGATIVE 02/21/2020 1044   LEUKOCYTESUR NEGATIVE 02/21/2020 1044   Recent Results (from the past 240 hour(s))  Culture, blood (routine x 2)     Status: None (Preliminary result)   Collection Time: 03/19/20  3:18 AM   Specimen: BLOOD RIGHT FOREARM  Result Value Ref Range Status   Specimen Description BLOOD RIGHT FOREARM  Final   Special Requests   Final    BOTTLES DRAWN AEROBIC AND ANAEROBIC Blood Culture adequate volume   Culture   Final    NO GROWTH < 12 HOURS Performed at [facility], [place] [address], [place], [place] [address]    Report Status PENDING  Incomplete  Culture, blood (routine x 2)     Status: None (Preliminary result)   Collection Time: 03/19/20  3:24 AM   Specimen: BLOOD  Result Value Ref Range Status   Specimen Description BLOOD  LEFT ANTECUBITAL  Final   Special Requests   Final    BOTTLES DRAWN AEROBIC AND ANAEROBIC Blood Culture adequate volume   Culture   Final    NO GROWTH < 12 HOURS Performed at [facility], [address] [place], [place] [address]    Report Status PENDING  Incomplete  SARS Coronavirus 2 by RT PCR     Status: None   Collection Time: 03/19/20  4:37 AM  Result Value Ref Range Status   SARS Coronavirus 2 NEGATIVE NEGATIVE Final    Comment: (NOTE) Result indicates the ABSENCE of SARS-CoV-2 RNA in the patient specimen.  The lowest concentration of SARS-CoV-2 viral copies this assay can detect in nasopharyngeal swab specimens is 500 copies / mL.  A negative result does not preclude SARS-CoV-2 infection and should not be used as the sole basis for patient management decisions. A negative result may occur with improper specimen collection / handling, submission of a specimen other than nasopharyngeal swab, presence of viral mutation(s) within the areas targeted by this assay, and inadequate number of viral copies (<500 copies / mL) present.  Negative results must be combined with  clinical observations, patient history, and epidemiological information.  The expected result is NEGATIVE.  Patient Fact Sheet:  BlogSelections.co.uk   Provider Fact Sheet:  https://lucas.com/   This test is not yet approved or cleared by the [place] FDA and  has been authorized for  detection and/or diagnosis of SARS-CoV-2 by FDA under an Emergency Use Authorization (EUA).  This EUA will remain in effect (meaning this test can be used) for the duration of  the COVID-19 declaration under Section 564(b)(1) of the Act, 21 U.S.C. section 360bbb-3(b)(1), unless the authorization is terminated or revoked sooner Performed at [facility], [place] [address], [place], [place] [address]       Radiology Studies: MR CERVICAL SPINE WO CONTRAST  Result Date: 03/19/2020 CLINICAL DATA:  69 year old female. Neck pain, fever, MSSA bacteremia. With evidence of cervical paravertebral soft tissue infection and osteomyelitis by MRI 02/21/2020. Persistently positive blood cultures. Restaging. A study without and with contrast was planned, but the patient was unable to tolerate and discontinued the exam prior to completion. No contrast was administered. EXAM: MRI CERVICAL SPINE WITHOUT CONTRAST TECHNIQUE: Multiplanar, multisequence MR imaging of the cervical spine was performed. No intravenous contrast was administered. COMPARISON:  Cervical spine MRI 02/22/2020. 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 Alignment: Stable straightening of cervical lordosis. Mildly increased kyphosis at T3-T4. Vertebrae: Abnormal decreased T1 signal and evidence of marrow edema a especially at the C5 through C7 vertebrae and the T3-T4 vertebrae. See series 6 images 8 and 10. And there has been partial endplate erosion anteriorly at T3-T4 since 02/22/2020. There is also some vertebral endplate marrow edema suspected  at C3-C4. The C6 C7 left side facets were highly edematous previously, and there is evidence of some residual marrow edema there today. Cord: Spinal cord signal is within normal limits at all visualized levels. Posterior Fossa, vertebral arteries, paraspinal tissues: Cervicomedullary junction is within normal limits. Negative visible brain parenchyma. Preserved major vascular flow voids in the neck. Regressed cervical prevertebral edema and phlegmon since March. However, there is a new small area of left T3-T4 prevertebral fluid or abscess on series 5, image 14. Negative visible lung apices. Disc levels: Regressed ventral epidural edema and/or dural thickening since March. There is mild multifactorial spinal stenosis C3-C4 through C5-C6.  Up to mild spinal cord mass effect, but no cord signal abnormality. IMPRESSION: 1. Evidence of new Discitis Osteomyelitis at T3-T4 since the March MRI, with a small associated prevertebral phlegmon or tiny abscess. 2. Regression of the widespread Cervical Spine Discitis Osteomyelitis since March, although marrow edema persists at C3-C4 and also C5 through C7. 3. The patient was unable to tolerate the entire exam and discontinued it prior to completion, with no contrast administered. Electronically Signed   By: Genevie Ann M.D.   On: 03/19/2020 12:20   DG CHEST PORT 1 VIEW  Result Date: 03/19/2020 CLINICAL DATA:  69 year old female with history of positive blood cultures. EXAM: PORTABLE CHEST 1 VIEW COMPARISON:  Chest x-ray 02/21/2020. FINDINGS: Lung volumes are normal. No consolidative airspace disease. No pleural effusions. No pneumothorax. No pulmonary nodule or mass noted. Pulmonary vasculature and the cardiomediastinal silhouette are within normal limits. IMPRESSION: No radiographic evidence of acute cardiopulmonary disease. Electronically Signed   By: Vinnie Langton M.D.   On: 03/19/2020 05:35   ECHOCARDIOGRAM LIMITED  Result Date: 03/19/2020    ECHOCARDIOGRAM LIMITED  REPORT   Patient Name:   KIANY WOHLERT Date of Exam: 03/19/2020 Medical Rec #:  [id number]      Height:       62.0 in Accession #:    [id number]     Weight:       100.3 lb Date of Birth:  Oct 12, 1951      BSA:          1.426 m Patient Age:    31 years       BP:           121/77 mmHg Patient Gender: F              HR:           78 bpm. Exam Location:  Inpatient Procedure: Limited Echo and Limited Color Doppler Indications:    Bacteremia 790.7 / R78.81  History:        Patient has prior history of Echocardiogram examinations, most                 recent 02/23/2020. Sepsis.  Sonographer:    Jonelle Sidle Dance Referring Phys: [place]  1. Left ventricular ejection fraction, by estimation, is 55 to 60%. The left ventricle has normal function.  2. Mild mitral valve regurgitation. Conclusion(s)/Recommendation(s): No evidence of valvular vegetations on this transthoracic echocardiogram. Would recommend a transesophageal echocardiogram to exclude infective endocarditis if clinically indicated. FINDINGS  Left Ventricle: Left ventricular ejection fraction, by estimation, is 55 to 60%. The left ventricle has normal function. Mitral Valve: Mild mitral valve regurgitation. Tricuspid Valve: Tricuspid valve regurgitation is mild.  LEFT VENTRICLE PLAX 2D LVIDd:         4.06 cm LVIDs:         3.15 cm LV PW:         1.41 cm LV IVS:        0.95 cm LVOT diam:     2.10 cm LVOT Area:     3.46 cm  IVC IVC diam: 1.82 cm LEFT ATRIUM             Index LA diam:        3.30 cm 2.31 cm/m LA Vol (A2C):   37.5 ml 26.30 ml/m LA Vol (A4C):   35.4 ml 24.83 ml/m LA Biplane Vol: 35.0 ml 24.55 ml/m   AORTA Ao Root diam: 3.30 cm Ao Asc  diam:  3.50 cm  SHUNTS Systemic Diam: 2.10 cm Ena Dawley MD Electronically signed by Ena Dawley MD Signature Date/Time: 03/19/2020/6:22:15 PM    Final     Scheduled Meds: . calcium-vitamin D  1 tablet Oral Daily  . [START ON 03/21/2020] enoxaparin (LOVENOX) injection  30 mg Subcutaneous  Daily  . ferrous sulfate  325 mg Oral Daily  . gabapentin  100 mg Oral TID  . multivitamin with minerals  1 tablet Oral Daily  . NONFORMULARY OR COMPOUNDED ITEM 1 mg  1 mg Oral QHS  . pantoprazole  40 mg Oral BID AC  . [START ON 03/25/2020] vitamin B-12  1,250 mcg Oral Q7 days   Continuous Infusions: .  ceFAZolin (ANCEF) IV 2 g (03/20/20 0612)     LOS: 1 day   Time spent: 25 minutes.  Patrecia Pour, MD [facility] [email or web address] 03/20/2020, 12:44 PM

## 2020-03-20 NOTE — Progress Notes (Signed)
[facility]   Reason for visit: Follow up on bacteremia  Interval History: blood cultures ngtd, no fever, no signfiicant complaints.  Mulitple questions.    Physical Exam: Constitutional:  Vitals:   03/20/20 0359 03/20/20 0918  BP: 123/73 140/70  Pulse: 76 79  Resp: 16 18  Temp: 98.7 F (37.1 C) 98.5 F (36.9 C)  SpO2: 94% 99%   patient appears in NAD Eyes: anicteric GI: soft, nt, nd Neuro: non focal  Review of Systems: Constitutional: negative for fevers and chills Gastrointestinal: positive for diarrhea  Lab Results  Component Value Date   WBC 5.4 03/19/2020   HGB 11.0 (L) 03/19/2020   HCT 35.2 (L) 03/19/2020   MCV 92.9 03/19/2020   PLT 324 03/19/2020    Lab Results  Component Value Date   CREATININE 0.87 03/19/2020   BUN 11 03/19/2020   NA 138 03/19/2020   K 3.7 03/19/2020   CL 103 03/19/2020   CO2 25 03/19/2020    Lab Results  Component Value Date   ALT 22 02/23/2020   AST 26 02/23/2020   ALKPHOS 92 02/23/2020     Microbiology: Recent Results (from the past 240 hour(s))  Culture, blood (routine x 2)     Status: None (Preliminary result)   Collection Time: 03/19/20  3:18 AM   Specimen: BLOOD RIGHT FOREARM  Result Value Ref Range Status   Specimen Description BLOOD RIGHT FOREARM  Final   Special Requests   Final    BOTTLES DRAWN AEROBIC AND ANAEROBIC Blood Culture adequate volume   Culture   Final    NO GROWTH < 12 HOURS Performed at [facility], [address], [place], [place] [address]    Report Status PENDING  Incomplete  Culture, blood (routine x 2)     Status: None (Preliminary result)   Collection Time: 03/19/20  3:24 AM   Specimen: BLOOD  Result Value Ref Range Status   Specimen Description BLOOD LEFT ANTECUBITAL  Final   Special Requests   Final    BOTTLES DRAWN AEROBIC AND ANAEROBIC Blood Culture adequate volume   Culture   Final    NO GROWTH < 12 HOURS Performed at [facility],  [place] [address], [place], [place] [address]    Report Status PENDING  Incomplete  SARS Coronavirus 2 by RT PCR     Status: None   Collection Time: 03/19/20  4:37 AM  Result Value Ref Range Status   SARS Coronavirus 2 NEGATIVE NEGATIVE Final    Comment: (NOTE) Result indicates the ABSENCE of SARS-CoV-2 RNA in the patient specimen.  The lowest concentration of SARS-CoV-2 viral copies this assay can detect in nasopharyngeal swab specimens is 500 copies / mL.  A negative result does not preclude SARS-CoV-2 infection and should not be used as the sole basis for patient management decisions. A negative result may occur with improper specimen collection / handling, submission of a specimen other than nasopharyngeal swab, presence of viral mutation(s) within the areas targeted by this assay, and inadequate number of viral copies (<500 copies / mL) present.  Negative results must be combined with clinical observations, patient history, and epidemiological information.  The expected result is NEGATIVE.  Patient Fact Sheet:  BlogSelections.co.uk   Provider Fact Sheet:  https://lucas.com/   This test is not yet approved or cleared by the [place] FDA and  has been authorized for  detection and/or diagnosis of SARS-CoV-2 by FDA under an  Emergency Use Authorization (EUA).  This EUA will remain in effect (meaning this test can be used) for the duration of  the COVID-19 declaration under Section 564(b)(1) of the Act, 21 U.S.C. section 360bbb-3(b)(1), unless the authorization is terminated or revoked sooner Performed at [facility], [place] [address] [place], [place] [address]     Impression/Plan:  1. Discitis/ostemyelitis - no new culture growth.  No indication for further aspiration.  Continue with Ancef and will need likely 8 weeks from now through June 9th.  Will consider continuation with Keflex after that.  May also consider stopping at 6  weeks (from now) on May 26th with conversion to pills if doing well.    2.  Access - ok to replace the picc line tomorrow if repeat blood cultures remain negative.   3.  Blood in stool - most c/w hemorrhoid

## 2020-03-20 NOTE — Progress Notes (Signed)
PHARMACY CONSULT NOTE FOR:  OUTPATIENT  PARENTERAL ANTIBIOTIC THERAPY (OPAT)  Indication: Discitis/ostemyelitis Regimen: Cefazolin IV 2g q8h End date: 05/13/20  IV antibiotic discharge orders are pended. To discharging provider:  please sign these orders via discharge navigator,  Select New Orders & click on the button choice - Manage This Unsigned Work.     Thank you for allowing pharmacy to be a part of this patient's care.  Sherren Kerns, PharmD PGY1 Acute Care Pharmacy Resident 03/20/2020, 2:22 PM

## 2020-03-21 ENCOUNTER — Inpatient Hospital Stay: Payer: Self-pay

## 2020-03-21 DIAGNOSIS — Z95828 Presence of other vascular implants and grafts: Secondary | ICD-10-CM

## 2020-03-21 MED ORDER — SODIUM CHLORIDE 0.9% FLUSH
10.0000 mL | INTRAVENOUS | Status: DC | PRN
  Administered 2020-03-21: 10 mL

## 2020-03-21 MED ORDER — HEPARIN SOD (PORK) LOCK FLUSH 100 UNIT/ML IV SOLN
250.0000 [IU] | INTRAVENOUS | Status: AC | PRN
  Administered 2020-03-21: 250 [IU]
  Filled 2020-03-21: qty 2.5

## 2020-03-21 MED ORDER — CHLORHEXIDINE GLUCONATE CLOTH 2 % EX PADS
6.0000 | MEDICATED_PAD | Freq: Every day | CUTANEOUS | Status: DC
  Administered 2020-03-21: 6 via TOPICAL

## 2020-03-21 NOTE — Progress Notes (Signed)
IV team placed picc and heparin locked the line. Peripheral IV was removed. Charge nurse reviewed AVS.

## 2020-03-21 NOTE — Discharge Summary (Signed)
Physician Discharge Summary  ENSLEY BLAS QTM:[id number] DOB: 11/05/1951 DOA: 03/18/2020  PCP: Lavone Orn, MD  Admit date: 03/18/2020 Discharge date: 03/21/2020  Admitted From: Home Disposition: Home   Recommendations for Outpatient Follow-up:  1. Follow up with ID is to be arranged.  2. Continue ancef thru PICC as below. 3. Monitor final results of blood cultures drawn 4/15. ADDENDUM: Remains no growth as of 03/23/2020.  Home Health: RN Equipment/Devices: PICC placed 4/17 Discharge Condition: Stable CODE STATUS: Full Diet recommendation: As tolerated  Brief/Interim Summary: Ryeleigh Santore Somersis a69 y.o.femalewith a history of eosinophilic esophagitis and recent admission for cervical paravertebral phlegmon, osteomyelitis with MSSA bacteremia discharged on ancef through PICC on 02/25/2020 planned through 04/03/2020. She reported some intermittent night sweats, though had improvement in neck pain since that time. Blood cultures were repeated, drawn by home health RN, and reportedly grew S. aureus so the patient was referred to the ED 4/14. WBC is normal, afebrile. PICC line was removed and ancef has been continued. MRI of the cervical spine showed regression of infection on previous imaging with new areas of thoracic infection. Repeat blood cultures have remained no growth to date for >48 hours. ID was consulted, recommending reinsertion of PICC if cultures negative and extending duration of ancef. The patient has remained afebrile without leukocytosis or focal pain related to findings on MRI. See below for full details.   Discharge Diagnoses:  Principal Problem:   Bacteremia Active Problems:   Anemia   Eosinophilic esophagitis  Persistent MSSA bacteremia with discitis/osteomyelitis:  - Continue ancef, per ID planning 8 additional weeks from now through June 9 with subsequent conversion to keflex. Could also transition as early as May 26 depending on course.  - Monitor repeat blood Cx's  (NGTD) - Inserted PICC 4/17  - No vegetations on TTE.   Left chest tenderness: Hurts with palpation, not pleuritic or exertional. No other evidence of VTE (tachycardia, hypoxemia, leg swelling) despite elevated d-dimer which could be related to bacteremia.  - Continue monitoring with supportive therapy.  Blood in stool: Very minimal, has stopped. Perhaps related to hemorrhoid.  - Continue monitoring.   Eosinophilic esophagitis:  - ContinuePPI,budesonide solution per home routine qHS.  Anxiety:  - Continue xanax, trazodone (30m qAM, 1090mqHS).   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  - SEVERE: Patient would require general anesthesia for any additional MRI exams.   Anemia: Presumably due to acute severe illness causing marrow suppression. Improved since last check. - Monitor, stable. Continue iron supplement.   Discharge Instructions Discharge Instructions    Home infusion instructions   Complete by: As directed    Instructions: Flushing of vascular access device: 0.9% NaCl pre/post medication administration and prn patency; Heparin 100 u/ml, 70m39mor implanted ports and Heparin 10u/ml, 70ml870mr all other central venous catheters.     Allergies as of 03/21/2020      Reactions   Contrast Media [iodinated Diagnostic Agents] Anaphylaxis, Shortness Of Breath, Swelling   PATIENT STATES THAT HER BROTHER HAD ANAPHYLAXIS WITH IV CONTRAST AND WANTS TO SHARE THIS FAMILY HISTORY. Brother died from reaction      Medication List    TAKE these medications   acetaminophen 650 MG CR tablet Commonly known as: TYLENOL Take 1,300 mg by mouth every 8 (eight) hours as needed for pain or fever.   ALPRAZolam 0.25 MG tablet Commonly known as: XANAX Take 0.25 mg by mouth 2 (two) times daily as needed for anxiety or sleep.  Blink Tears 0.25 % Soln Generic drug: Polyethylene Glycol 400 Place 1 drop into both eyes 3 (three) times daily as needed (for dry eyes,  when not using Systane).   budesonide 0.5 MG/2ML nebulizer solution Commonly known as: PULMICORT Take 1 mg by nebulization See admin instructions. Mix 1 mg (2 ampules) into 5 ml's of sugar-free simple syrup and drink as needed for symptoms of Eosinophilic esophagitis   Calcium Carbonate-Vitamin D 600-400 MG-UNIT chew tablet Chew 1 tablet by mouth daily.   CALCIUM CITRATE + D PO Take 2 tablets by mouth.   ceFAZolin  IVPB Commonly known as: ANCEF Inject 2 g into the vein every 8 (eight) hours. Indication: Discitis/ostemyelitis Last Day of Therapy:  05/13/20 Labs - Once weekly:  CBC/D and BMP, Labs - Every other week:  ESR and CRP What changed: additional instructions   Daily Multivitamin Caps Take 1 capsule by mouth daily.   gabapentin 100 MG capsule Commonly known as: NEURONTIN Take 100 mg by mouth 3 (three) times daily.   Ginkgo Biloba 120 MG Tabs Take 120 mg by mouth daily.   GLUCOSAMINE CHONDROITIN ADV PO Take 2 capsules by mouth in the morning and at bedtime.   High Potency Iron 65 MG Tabs Take 65 mg by mouth daily.   loperamide 2 MG capsule Commonly known as: IMODIUM Take 2 mg by mouth as needed for diarrhea or loose stools.   pantoprazole 40 MG tablet Commonly known as: PROTONIX Take 40 mg by mouth 2 (two) times daily before a meal.   Salonpas 3.12-10-08 % Ptch Generic drug: Camphor-Menthol-Methyl Sal Apply 2 patches topically See admin instructions. Apply 1 patch neck and chest wall as needed for pain.   Soy Isoflavone 40 MG Tabs Take 80 mg by mouth daily.   SYSTANE HYDRATION PF OP Place 1 drop into both eyes 3 (three) times daily as needed (for dry eyes, when not using Blink Tears).   traZODone 50 MG tablet Commonly known as: DESYREL Patient takes 50 mg in the am and 100 mg at bedtime What changed:   how much to take  how to take this  when to take this  additional instructions   TURMERIC PO Take 2 capsules by mouth daily.   Vitamin B-12 2500  MCG Subl Place 1,250 mcg under the tongue every 7 (seven) days.            Home Infusion Instuctions  (From admission, onward)         Start     Ordered   03/20/20 0000  Home infusion instructions    Question:  Instructions  Answer:  Flushing of vascular access device: 0.9% NaCl pre/post medication administration and prn patency; Heparin 100 u/ml, 21m for implanted ports and Heparin 10u/ml, 577mfor all other central venous catheters.   03/20/20 1422         Follow-up Information    GrLavone OrnMD. Schedule an appointment as soon as possible for a visit in 1 week(s).   Specialty: Internal Medicine Contact information: [phone]        CaMichel BickersMD. Schedule an appointment as soon as possible for a visit in 6 week(s).   Specialty: Infectious Diseases Contact information: [address] [place] [phone]        Allergies  Allergen Reactions  . Contrast Media [Iodinated Diagnostic Agents] Anaphylaxis, Shortness Of Breath and Swelling    PATIENT  STATES THAT HER BROTHER HAD ANAPHYLAXIS WITH IV CONTRAST AND WANTS TO SHARE THIS FAMILY HISTORY. Brother died from reaction    Consultations:  ID  Procedures/Studies: DG Chest 2 View  Result Date: 02/20/2020 CLINICAL DATA:  Fever and chills EXAM: CHEST - 2 VIEW COMPARISON:  February 16, 2006. 09/23/2013 FINDINGS: There is blunting of the costophrenic angles bilaterally. There is no pneumothorax. No large focal infiltrate. The heart size is normal. There is no acute osseous abnormality. There are degenerative changes throughout the visualized thoracolumbar spine. Aortic calcifications are noted. IMPRESSION: Blunting of the costophrenic angles bilaterally, which may represent small effusions. No focal infiltrate. Electronically Signed   By: Constance Holster M.D.   On: 02/20/2020 15:18   MR CERVICAL SPINE WO CONTRAST  Result  Date: 03/19/2020 CLINICAL DATA:  69 year old female. Neck pain, fever, MSSA bacteremia. With evidence of cervical paravertebral soft tissue infection and osteomyelitis by MRI 02/21/2020. Persistently positive blood cultures. Restaging. A study without and with contrast was planned, but the patient was unable to tolerate and discontinued the exam prior to completion. No contrast was administered. EXAM: MRI CERVICAL SPINE WITHOUT CONTRAST TECHNIQUE: Multiplanar, multisequence MR imaging of the cervical spine was performed. No intravenous contrast was administered. COMPARISON:  Cervical spine MRI 02/22/2020. 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 Alignment: Stable straightening of cervical lordosis. Mildly increased kyphosis at T3-T4. Vertebrae: Abnormal decreased T1 signal and evidence of marrow edema a especially at the C5 through C7 vertebrae and the T3-T4 vertebrae. See series 6 images 8 and 10. And there has been partial endplate erosion anteriorly at T3-T4 since 02/22/2020. There is also some vertebral endplate marrow edema suspected at C3-C4. The C6 C7 left side facets were highly edematous previously, and there is evidence of some residual marrow edema there today. Cord: Spinal cord signal is within normal limits at all visualized levels. Posterior Fossa, vertebral arteries, paraspinal tissues: Cervicomedullary junction is within normal limits. Negative visible brain parenchyma. Preserved major vascular flow voids in the neck. Regressed cervical prevertebral edema and phlegmon since March. However, there is a new small area of left T3-T4 prevertebral fluid or abscess on series 5, image 14. Negative visible lung apices. Disc levels: Regressed ventral epidural edema and/or dural thickening since March. There is mild multifactorial spinal stenosis C3-C4 through C5-C6. Up to mild spinal cord mass effect, but no cord signal abnormality.  IMPRESSION: 1. Evidence of new Discitis Osteomyelitis at T3-T4 since the March MRI, with a small associated prevertebral phlegmon or tiny abscess. 2. Regression of the widespread Cervical Spine Discitis Osteomyelitis since March, although marrow edema persists at C3-C4 and also C5 through C7. 3. The patient was unable to tolerate the entire exam and discontinued it prior to completion, with no contrast administered. Electronically Signed   By: Genevie Ann M.D.   On: 03/19/2020 12:20   MR THORACIC SPINE WO CONTRAST  Result Date: 02/22/2020 CLINICAL DATA:  MSSA bacteremia, leg weakness. Cervical infection EXAM: MRI THORACIC AND LUMBAR SPINE WITHOUT CONTRAST TECHNIQUE: Multiplanar and multiecho pulse sequences of the thoracic and lumbar spine were obtained without intravenous contrast. COMPARISON:  MRI cervical spine 02/21/2020 FINDINGS: A portion of the cervical spine was included within the field of view on the sagittal images again demonstrating extensive infectious findings including prominent prevertebral phlegmon extending from C2 to T1 (series 21, image 8). Ventral epidural phlegmonous changes are also noted from the C2 level extending down to approximately T1-T2 (series 21, image  9). Fluid signal within the C5-6 and C6-7 discs with marrow edema in the C5, C6, and C7 vertebral bodies compatible with discitis-osteomyelitis. MRI THORACIC SPINE FINDINGS Alignment: 2-3 mm grade 1 anterolisthesis T3 on T4. Normal thoracic kyphosis. Vertebrae: The thoracic vertebral body heights are maintained without evidence of fracture. There are a few scattered intraosseous hemangiomas, notably within the T2 and T7 vertebral bodies. No evidence of discitis within the thoracic spine. No evidence of osteomyelitis within the thoracic spine. No suspicious marrow replacing lesion. Cord:  Normal signal and morphology. Paraspinal and other soft tissues: Prevertebral phlegmon and ventral epidural phlegmon again noted extending from the  cervical region down to the T1-2 level, better characterized on small field-of-view cervical spine MRI 02/21/2020. Disc levels: No significant disc protrusion. No foraminal or canal stenosis is seen within the thoracic spine. MRI LUMBAR SPINE FINDINGS Segmentation:  Standard. Alignment:  Trace retrolisthesis L3 on L4 and L4 on L5. Vertebrae: No fracture, evidence of discitis, or bone lesion. Incidental intraosseous hemangioma within the L1 vertebral body. Multilevel discogenic endplate marrow changes. Conus medullaris and cauda equina: Conus extends to the L1-2 level. Conus and cauda equina appear normal. Paraspinal and other soft tissues: Negative. Disc levels: T12-L1: No significant disc protrusion, foraminal stenosis, or canal stenosis. L1-L2: Mild diffuse disc bulge. No foraminal or canal stenosis. L2-L3: Mild diffuse disc bulge. No foraminal or canal stenosis. L3-L4: Diffuse disc bulge with bilateral facet arthropathy and ligamentum flavum buckling resulting in mild canal stenosis and mild right foraminal stenosis. There is a lobulated T1 isointense, T2 slightly hyperintense structure within the ventral epidural space posterior to the L4 vertebral body (series 1, image 8; series 4, images 15-16) which may reflect a large disc herniation, possibly arising from the L3-4 disc. This causes mild canal stenosis at the L4 level. Material extends to the L4-5 subarticular recess on the left (series 4, image 17). L4-L5: Severe left subarticular recess stenosis from extension of probable disc material. Bilateral facet arthropathy contribute to mild left foraminal stenosis and mild canal stenosis. There is a posterior facet synovial cyst on the left at L4-5 (series 2, image 12). L5-S1: Mild disc bulge and bilateral facet arthrosis result in mild left foraminal stenosis. No canal stenosis. IMPRESSION: MRI Thoracic Spine: 1. No evidence for thoracic discitis-osteomyelitis. 2. Cervical discitis-osteomyelitis of C5-6 and C6-7  with extensive prevertebral phlegmon and ventral epidural phlegmon extending from C2 to T1-2 level, better characterized on dedicated cervical spine MRI. No appreciable interval change from prior. 3. No significant disc protrusion, foraminal stenosis, or canal stenosis. MRI Lumbar Spine: 1. No evidence for lumbar discitis-osteomyelitis. 2. Mild multilevel spondylosis with mild canal stenosis at L3-4 and L4-5. 3. Lobulated T1 isointense structure within the ventral epidural space posterior to the L4 vertebral body which may reflect a large disc herniation, possibly arising from the L3-4 disc. This causes mild canal stenosis at L4 level and severe left L4-5 subarticular recess stenosis. Electronically Signed   By: Davina Poke D.O.   On: 02/22/2020 14:41   MR LUMBAR SPINE WO CONTRAST  Result Date: 02/22/2020 CLINICAL DATA:  MSSA bacteremia, leg weakness. Cervical infection EXAM: MRI THORACIC AND LUMBAR SPINE WITHOUT CONTRAST TECHNIQUE: Multiplanar and multiecho pulse sequences of the thoracic and lumbar spine were obtained without intravenous contrast. COMPARISON:  MRI cervical spine 02/21/2020 FINDINGS: A portion of the cervical spine was included within the field of view on the sagittal images again demonstrating extensive infectious findings including prominent prevertebral phlegmon extending from C2 to T1 (  series 21, image 8). Ventral epidural phlegmonous changes are also noted from the C2 level extending down to approximately T1-T2 (series 21, image 9). Fluid signal within the C5-6 and C6-7 discs with marrow edema in the C5, C6, and C7 vertebral bodies compatible with discitis-osteomyelitis. MRI THORACIC SPINE FINDINGS Alignment: 2-3 mm grade 1 anterolisthesis T3 on T4. Normal thoracic kyphosis. Vertebrae: The thoracic vertebral body heights are maintained without evidence of fracture. There are a few scattered intraosseous hemangiomas, notably within the T2 and T7 vertebral bodies. No evidence of  discitis within the thoracic spine. No evidence of osteomyelitis within the thoracic spine. No suspicious marrow replacing lesion. Cord:  Normal signal and morphology. Paraspinal and other soft tissues: Prevertebral phlegmon and ventral epidural phlegmon again noted extending from the cervical region down to the T1-2 level, better characterized on small field-of-view cervical spine MRI 02/21/2020. Disc levels: No significant disc protrusion. No foraminal or canal stenosis is seen within the thoracic spine. MRI LUMBAR SPINE FINDINGS Segmentation:  Standard. Alignment:  Trace retrolisthesis L3 on L4 and L4 on L5. Vertebrae: No fracture, evidence of discitis, or bone lesion. Incidental intraosseous hemangioma within the L1 vertebral body. Multilevel discogenic endplate marrow changes. Conus medullaris and cauda equina: Conus extends to the L1-2 level. Conus and cauda equina appear normal. Paraspinal and other soft tissues: Negative. Disc levels: T12-L1: No significant disc protrusion, foraminal stenosis, or canal stenosis. L1-L2: Mild diffuse disc bulge. No foraminal or canal stenosis. L2-L3: Mild diffuse disc bulge. No foraminal or canal stenosis. L3-L4: Diffuse disc bulge with bilateral facet arthropathy and ligamentum flavum buckling resulting in mild canal stenosis and mild right foraminal stenosis. There is a lobulated T1 isointense, T2 slightly hyperintense structure within the ventral epidural space posterior to the L4 vertebral body (series 1, image 8; series 4, images 15-16) which may reflect a large disc herniation, possibly arising from the L3-4 disc. This causes mild canal stenosis at the L4 level. Material extends to the L4-5 subarticular recess on the left (series 4, image 17). L4-L5: Severe left subarticular recess stenosis from extension of probable disc material. Bilateral facet arthropathy contribute to mild left foraminal stenosis and mild canal stenosis. There is a posterior facet synovial cyst on  the left at L4-5 (series 2, image 12). L5-S1: Mild disc bulge and bilateral facet arthrosis result in mild left foraminal stenosis. No canal stenosis. IMPRESSION: MRI Thoracic Spine: 1. No evidence for thoracic discitis-osteomyelitis. 2. 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 No appreciable interval change from prior. 3. No significant disc protrusion, foraminal stenosis, or canal stenosis. MRI Lumbar Spine: 1. No evidence for lumbar discitis-osteomyelitis. 2. Mild multilevel spondylosis with mild canal stenosis at L3-4 and L4-5. 3. Lobulated T1 isointense structure within the ventral epidural space posterior to the L4 vertebral body which may reflect a large disc herniation, possibly arising from the L3-4 disc. This causes mild canal stenosis at L4 level and severe left L4-5 subarticular recess stenosis. Electronically Signed   By: Davina Poke D.O.   On: 02/22/2020 14:41   MR Cervical Spine W or Wo Contrast  Result Date: 02/21/2020 CLINICAL DATA:  Neck pain and fever, rule out infection. EXAM: MRI CERVICAL SPINE WITHOUT AND WITH CONTRAST TECHNIQUE: Multiplanar and multiecho pulse sequences of the cervical spine, to include the craniocervical junction and cervicothoracic junction, were obtained without and with intravenous contrast. CONTRAST:  4.13m GADAVIST  GADOBUTROL 1 MMOL/ML IV SOLN COMPARISON:  Neck CT 09/22/2013 FINDINGS: Multiple sequences are moderate to severely motion degraded, limiting evaluation. Alignment: C3-C4 grade 1 retrolisthesis. C3-C4 grade 1 anterolisthesis. 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 There is marked multilevel degenerative endplate irregularity. There is T1 low signal as well as enhancement within the C4, C5, C6 and C7 vertebrae. Additionally, there is  prominent marrow edema and enhancement along the C4-C5 and C5-C6 facet joints on the left. Cord: No definite spinal cord signal abnormality or abnormal cord enhancement is identified within the limitations of significant motion degradation. There is extensive ventral epidural phlegmon spanning the C2-T1 levels. This measures up to 5 mm in greatest AP dimension at the C6-C7 level where there also appears to be formed ventral epidural abscess (series 9, image 10) (series 10, image 25). Abnormal enhancement extends into the neural foramina bilaterally at multiple levels. Posterior Fossa, vertebral arteries, paraspinal tissues: There is extensive ventrolateral paravertebral soft tissue swelling, edema and enhancement consistent extending from the level of the skull base to the T1 level. The prevertebral soft tissues are thickened to 9 mm. Findings are most consistent with cellulitis/phlegmon at this site. There are small foci of hypoenhancement ventral to the C6 and C7 vertebrae which may reflect relatively spared musculature. Early small abscesses are not excluded Disc levels: Severe multilevel disc degeneration. C2-C3: Congenital fusion. Ventral epidural phlegmon. No significant spinal canal or neural foraminal narrowing. C3-C4: Posterior disc osteophyte complex. Uncinate, facet and ligamentum flavum hypertrophy. Ventral epidural phlegmon. Moderate to moderately advanced spinal canal stenosis. Mild left neural foraminal narrowing. C4-C5: Posterior disc osteophyte complex. Uncinate, facet and ligamentum flavum hypertrophy. Ventral epidural phlegmon. Moderate to moderately advanced spinal canal stenosis. Moderate left neural foraminal narrowing C5-C6: Posterior disc osteophyte complex. Uncinate/facet hypertrophy. Ventral epidural phlegmon. Moderate spinal canal stenosis. Mild bilateral neural foraminal narrowing. C6-C7: Posterior disc osteophyte complex. Ventral epidural abscess. Bilateral disc osteophyte ridge/uncinate  hypertrophy. Facet hypertrophy. Ventral epidural abscess. Moderate spinal canal stenosis. Moderate bilateral neural foraminal narrowing (greater on the right). C7-T1: Right-sided disc osteophyte ridge. Facet hypertrophy. No significant spinal canal stenosis. Moderate right with mild left neural foraminal narrowing. These results were called by telephone at the time of interpretation on 02/21/2020 at 2:33 pm to provider Dr. Gustavus Messing, who verbally acknowledged these results. IMPRESSION: Multiple sequences are moderate to severely motion degraded, limiting evaluation. 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 Findings consistent with extensive soft tissue infection within the neck. This includes ventrolateral prevertebral cellulitis/phlegmon extending from the skull base to the T1 level. There are small foci of hypoenhancement anterior to the C6 and C7 vertebrae which may reflect relatively spared musculature. Small abscesses are not excluded. Ventral epidural phlegmon spanning the C2-T1 levels. This measures up to 8 mm in greatest AP dimension at the C6-C7 level where there also appears to be formed ventral epidural abscess. Abnormal enhancement extends into the bilateral neural foramina at multiple levels. Edema and enhancement within the C4, C5, C6 and C7 vertebrae. Prominent marrow edema and enhancement are also present along the C4-C5 and C5-C6 facet joints on the left. Findings are highly suspicious for osteomyelitis given the constellation of findings, although there is moderate to advanced cervical spondylosis. Multilevel spinal canal stenosis greatest at C3-C4 and C4-C5 (moderate to moderately advanced at these levels). Sites of mild and moderate neural foraminal narrowing as described. Electronically Signed   By: Kellie Simmering DO  On: 02/21/2020 14:35   DG CHEST PORT 1 VIEW  Result Date: 03/19/2020 CLINICAL DATA:  69 year old female with history of positive blood cultures. EXAM:  PORTABLE CHEST 1 VIEW COMPARISON:  Chest x-ray 02/21/2020. FINDINGS: Lung volumes are normal. No consolidative airspace disease. No pleural effusions. No pneumothorax. No pulmonary nodule or mass noted. Pulmonary vasculature and the cardiomediastinal silhouette are within normal limits. IMPRESSION: No radiographic evidence of acute cardiopulmonary disease. Electronically Signed   By: Vinnie Langton M.D.   On: 03/19/2020 05:35   DG Chest Port 1 View  Result Date: 02/21/2020 CLINICAL DATA:  Fever and cough over the last 10 days. EXAM: PORTABLE CHEST 1 VIEW COMPARISON:  02/20/2020 FINDINGS: The heart size and mediastinal contours are within normal limits. Both lungs are clear. The visualized skeletal structures are unremarkable. IMPRESSION: No active disease. Electronically Signed   By: Nelson Chimes M.D.   On: 02/21/2020 10:55   ECHOCARDIOGRAM COMPLETE  Result Date: 02/23/2020    ECHOCARDIOGRAM REPORT   Patient Name:   NURAH PETRIDES Date of Exam: 02/23/2020 Medical Rec #:  [id number]      Height:       62.0 in Accession #:    [id number]     Weight:       121.9 lb Date of Birth:  01-01-1951      BSA:          1.549 m Patient Age:    22 years       BP:           127/64 mmHg Patient Gender: F              HR:           97 bpm. Exam Location:  Inpatient Procedure: 2D Echo, Cardiac Doppler and Color Doppler Indications:    Bacteremia 790.7 / R78.81  History:        Patient has no prior history of Echocardiogram examinations.                 Sepsis (HCC),Cellulitis and abscess of neck.  Sonographer:    Alvino Chapel RCS Referring Phys: [place]  1. Left ventricular ejection fraction, by estimation, is 60 to 65%. The left ventricle has normal function. The left ventricle has no regional wall motion abnormalities. Left ventricular diastolic parameters are indeterminate.  2. Right ventricular systolic function is normal. The right ventricular size is normal.  3. Left atrial size was mildly  dilated.  4. The mitral valve is normal in structure. No evidence of mitral valve regurgitation. No evidence of mitral stenosis.  5. The aortic valve is tricuspid. Aortic valve regurgitation is not visualized. Mild aortic valve sclerosis is present, with no evidence of aortic valve stenosis.  6. The inferior vena cava is dilated in size with <50% respiratory variability, suggesting right atrial pressure of 15 mmHg. Conclusion(s)/Recommendation(s): No evidence of valvular vegetations on this transthoracic echocardiogram. Would recommend a transesophageal echocardiogram to exclude infective endocarditis if clinically indicated. FINDINGS  Left Ventricle: Left ventricular ejection fraction, by estimation, is 60 to 65%. The left ventricle has normal function. The left ventricle has no regional wall motion abnormalities. The left ventricular internal cavity size was normal in size. There is  no left ventricular hypertrophy. Left ventricular diastolic parameters are indeterminate. Right Ventricle: The right ventricular size is normal. No increase in right ventricular wall thickness. Right ventricular systolic function is normal. Left Atrium: Left atrial size was mildly dilated. Right Atrium:  Right atrial size was normal in size. Pericardium: There is no evidence of pericardial effusion. Mitral Valve: The mitral valve is normal in structure. Normal mobility of the mitral valve leaflets. No evidence of mitral valve regurgitation. No evidence of mitral valve stenosis. Tricuspid Valve: The tricuspid valve is normal in structure. Tricuspid valve regurgitation is not demonstrated. No evidence of tricuspid stenosis. Aortic Valve: The aortic valve is tricuspid. Aortic valve regurgitation is not visualized. Mild aortic valve sclerosis is present, with no evidence of aortic valve stenosis. Pulmonic Valve: The pulmonic valve was normal in structure. Pulmonic valve regurgitation is not visualized. No evidence of pulmonic stenosis.  Aorta: The aortic root is normal in size and structure. Venous: The inferior vena cava is dilated in size with less than 50% respiratory variability, suggesting right atrial pressure of 15 mmHg. IAS/Shunts: No atrial level shunt detected by color flow Doppler.  LEFT VENTRICLE PLAX 2D LVIDd:         4.55 cm  Diastology LVIDs:         2.85 cm  LV e' lateral:   14.10 cm/s LV PW:         1.10 cm  LV E/e' lateral: 6.7 LV IVS:        1.00 cm  LV e' medial:    10.90 cm/s LVOT diam:     1.70 cm  LV E/e' medial:  8.6 LV SV:         53 LV SV Index:   34 LVOT Area:     2.27 cm  RIGHT VENTRICLE RV S prime:     16.90 cm/s TAPSE (M-mode): 2.0 cm LEFT ATRIUM             Index       RIGHT ATRIUM           Index LA diam:        2.80 cm 1.81 cm/m  RA Area:     14.10 cm LA Vol (A2C):   64.4 ml 41.58 ml/m RA Volume:   32.40 ml  20.92 ml/m LA Vol (A4C):   40.7 ml 26.24 ml/m LA Biplane Vol: 51.4 ml 33.18 ml/m  AORTIC VALVE LVOT Vmax:   116.00 cm/s LVOT Vmean:  83.900 cm/s LVOT VTI:    0.233 m  AORTA Ao Root diam: 2.90 cm MITRAL VALVE MV Area (PHT): 3.27 cm    SHUNTS MV Decel Time: 232 msec    Systemic VTI:  0.23 m MV E velocity: 93.80 cm/s  Systemic Diam: 1.70 cm MV A velocity: 79.30 cm/s MV E/A ratio:  1.18 Candee Furbish MD Electronically signed by Candee Furbish MD Signature Date/Time: 02/23/2020/3:23:17 PM    Final    ECHOCARDIOGRAM LIMITED  Result Date: 03/19/2020    ECHOCARDIOGRAM LIMITED REPORT   Patient Name:   ASHAUNA BERTHOLF Date of Exam: 03/19/2020 Medical Rec #:  [id number]      Height:       62.0 in Accession #:    [id number]     Weight:       100.3 lb Date of Birth:  07-16-1951      BSA:          1.426 m Patient Age:    18 years       BP:           121/77 mmHg Patient Gender: F              HR:  78 bpm. Exam Location:  Inpatient Procedure: Limited Echo and Limited Color Doppler Indications:    Bacteremia 790.7 / R78.81  History:        Patient has prior history of Echocardiogram examinations, most                  recent 02/23/2020. Sepsis.  Sonographer:    Jonelle Sidle Dance Referring Phys: [place]  1. Left ventricular ejection fraction, by estimation, is 55 to 60%. The left ventricle has normal function.  2. Mild mitral valve regurgitation. Conclusion(s)/Recommendation(s): No evidence of valvular vegetations on this transthoracic echocardiogram. Would recommend a transesophageal echocardiogram to exclude infective endocarditis if clinically indicated. FINDINGS  Left Ventricle: Left ventricular ejection fraction, by estimation, is 55 to 60%. The left ventricle has normal function. Mitral Valve: Mild mitral valve regurgitation. Tricuspid Valve: Tricuspid valve regurgitation is mild.  LEFT VENTRICLE PLAX 2D LVIDd:         4.06 cm LVIDs:         3.15 cm LV PW:         1.41 cm LV IVS:        0.95 cm LVOT diam:     2.10 cm LVOT Area:     3.46 cm  IVC IVC diam: 1.82 cm LEFT ATRIUM             Index LA diam:        3.30 cm 2.31 cm/m LA Vol (A2C):   37.5 ml 26.30 ml/m LA Vol (A4C):   35.4 ml 24.83 ml/m LA Biplane Vol: 35.0 ml 24.55 ml/m   AORTA Ao Root diam: 3.30 cm Ao Asc diam:  3.50 cm  SHUNTS Systemic Diam: 2.10 cm Ena Dawley MD Electronically signed by Ena Dawley MD Signature Date/Time: 03/19/2020/6:22:15 PM    Final    [place] EKG SITE RITE  Result Date: 03/21/2020 If Site Rite image not attached, placement could not be confirmed due to current cardiac rhythm.  [place] EKG SITE RITE  Result Date: 02/25/2020 If [facility] image not attached, placement could not be confirmed due to current cardiac rhythm.  [place] EKG SITE RITE  Result Date: 02/25/2020 If [facility] image not attached, placement could not be confirmed due to current cardiac rhythm.    Subjective: No fever documented. Ready to go home, still feels fine.   Discharge Exam: Vitals:   03/20/20 2124 03/21/20 0411  BP: 137/65 113/63  Pulse: 74 64  Resp:    Temp: 98.9 F (37.2 C) 97.8 F (36.6 C)  SpO2: 98% 97%   General: Pt  is alert, awake, not in acute distress Cardiovascular: RRR, no murmur Respiratory: CTA bilaterally, no wheezing, no rhonchi Abdominal: Soft, NT, ND, bowel sounds + Extremities: No edema, no cyanosis  Labs: Basic Metabolic Panel: Recent Labs  Lab 03/19/20 0310 03/19/20 0813  NA 138  --   K 3.7  --   CL 103  --   CO2 25  --   GLUCOSE 107*  --   BUN 11  --   CREATININE 0.77 0.87  CALCIUM 9.3  --    CBC: Recent Labs  Lab 03/19/20 0310 03/19/20 0813  WBC 5.0 5.4  NEUTROABS 2.2  --   HGB 11.1* 11.0*  HCT 35.9* 35.2*  MCV 92.1 92.9  PLT 339 324   D-Dimer Recent Labs    03/19/20 0813  DDIMER 1.51*   Urinalysis    Component Value Date/Time   COLORURINE YELLOW 02/21/2020 1044  APPEARANCEUR CLEAR 02/21/2020 1044   LABSPEC 1.010 02/21/2020 1044   PHURINE 7.0 02/21/2020 [place] 02/21/2020 1044   HGBUR NEGATIVE 02/21/2020 [place] 02/21/2020 [place] 02/21/2020 1044   PROTEINUR NEGATIVE 02/21/2020 1044   NITRITE NEGATIVE 02/21/2020 [place] 02/21/2020 1044    Microbiology Recent Results (from the past 240 hour(s))  Culture, blood (routine x 2)     Status: None (Preliminary result)   Collection Time: 03/19/20  3:18 AM   Specimen: BLOOD RIGHT FOREARM  Result Value Ref Range Status   Specimen Description BLOOD RIGHT FOREARM  Final   Special Requests   Final    BOTTLES DRAWN AEROBIC AND ANAEROBIC Blood Culture adequate volume   Culture   Final    NO GROWTH 2 DAYS Performed at [facility], [place] [address], [place], [place] [address]    Report Status PENDING  Incomplete  Culture, blood (routine x 2)     Status: None (Preliminary result)   Collection Time: 03/19/20  3:24 AM   Specimen: BLOOD  Result Value Ref Range Status   Specimen Description BLOOD LEFT ANTECUBITAL  Final   Special Requests   Final    BOTTLES DRAWN AEROBIC AND ANAEROBIC Blood Culture adequate volume   Culture    Final    NO GROWTH 2 DAYS Performed at [facility], [place] [address], [place], [place] [address]    Report Status PENDING  Incomplete  SARS Coronavirus 2 by RT PCR     Status: None   Collection Time: 03/19/20  4:37 AM  Result Value Ref Range Status   SARS Coronavirus 2 NEGATIVE NEGATIVE Final    Comment: (NOTE) Result indicates the ABSENCE of SARS-CoV-2 RNA in the patient specimen.  The lowest concentration of SARS-CoV-2 viral copies this assay can detect in nasopharyngeal swab specimens is 500 copies / mL.  A negative result does not preclude SARS-CoV-2 infection and should not be used as the sole basis for patient management decisions. A negative result may occur with improper specimen collection / handling, submission of a specimen other than nasopharyngeal swab, presence of viral mutation(s) within the areas targeted by this assay, and inadequate number of viral copies (<500 copies / mL) present.  Negative results must be combined with clinical observations, patient history, and epidemiological information.  The expected result is NEGATIVE.  Patient Fact Sheet:  BlogSelections.co.uk   Provider Fact Sheet:  https://lucas.com/   This test is not yet approved or cleared by the [place] FDA and  has been authorized for  detection and/or diagnosis of SARS-CoV-2 by FDA under an Emergency Use Authorization (EUA).  This EUA will remain in effect (meaning this test can be used) for the duration of  the COVID-19 declaration under Section 564(b)(1) of the Act, 21 U.S.C. section 360bbb-3(b)(1), unless the authorization is terminated or revoked sooner Performed at [facility], [place] [address] [place], [place] [address]     Time coordinating discharge: Approximately 40 minutes  Patrecia Pour, MD  [facility] 03/21/2020, 8:15 AM

## 2020-03-21 NOTE — Progress Notes (Signed)
Cultures have remained negative and picc line placed. She has follow up arranged with Dr. Megan Salon.  OK to d/c from ID standpoint.   Thayer Headings, MD

## 2020-03-21 NOTE — Progress Notes (Signed)
Peripherally Inserted Central Catheter Placement  The IV Nurse has discussed with the patient and/or persons authorized to consent for the patient, the purpose of this procedure and the potential benefits and risks involved with this procedure.  The benefits include less needle sticks, lab draws from the catheter, and the patient may be discharged home with the catheter. Risks include, but not limited to, infection, bleeding, blood clot (thrombus formation), and puncture of an artery; nerve damage and irregular heartbeat and possibility to perform a PICC exchange if needed/ordered by physician.  Alternatives to this procedure were also discussed.  Bard Power PICC patient education guide, fact sheet on infection prevention and patient information card has been provided to patient /or left at bedside.    PICC Placement Documentation  PICC Single Lumen [id number] PICC Right Basilic 34 cm 0 cm (Active)  Indication for Insertion or Continuance of Line Prolonged intravenous therapies 03/21/20 1000  Exposed Catheter (cm) 0 cm 03/21/20 1000  Site Assessment Clean;Dry;Intact 03/21/20 1000  Line Status Flushed;Blood return noted;Saline locked 03/21/20 1000  Dressing Type Transparent 03/21/20 1000  Dressing Status Clean;Dry;Intact;Antimicrobial disc in place 03/21/20 [place] checked and tightened 03/21/20 1000  Line Adjustment (NICU/IV Team Only) No 03/21/20 1000  Dressing Change Due 03/28/20 03/21/20 1000       Megan Cox 03/21/2020, 10:05 AM

## 2020-03-21 NOTE — TOC Transition Note (Addendum)
Transition of Care [facility]) - CM/SW Discharge Note   Patient Details  Name: Megan Cox MRN: [id number] Date of Birth: 11/15/1951  [facility] CM/SW Contact:  Carles Collet, RN Phone Number: 03/21/2020, 8:46 AM   Clinical Narrative:    Patient to DC to home today. Notified Pam w [facility], and [facility]. Per Pam, patient has medication at home. Pam states that she will reach out to patient to notify her to resume and next dose due will be at 2pm. Patient was established with [facility] and [facility] prior to admission.   Patient will need extender on PICC line to accommodate self administration. Pam states she spoke w Urban Gibson, Therapist, sports about this.     Final next level of care: [place] Barriers to Discharge: No Barriers Identified   Patient Goals and CMS Choice Patient states their goals for this hospitalization and ongoing recovery are:: to go home      Discharge Placement                       Discharge Plan and Services                          HH Arranged: RN [facility] Agency: [place] Date [place]: 03/21/20 Time [place]: [phone] Representative spoke with at [place]: [place]) Interventions     Readmission Risk Interventions No flowsheet data found.

## 2020-03-24 LAB — CULTURE, BLOOD (ROUTINE X 2)
Culture: NO GROWTH
Culture: NO GROWTH
Special Requests: ADEQUATE
Special Requests: ADEQUATE

## 2020-03-25 ENCOUNTER — Telehealth: Payer: Self-pay

## 2020-03-25 NOTE — Telephone Encounter (Signed)
Patient called office with concerns she noticed on abdomen. 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 Patient noticed this morning mold had discoloration and small cyst. Patient was advised to contact dermatology team. Would look to make MD aware. [place]

## 2020-03-30 ENCOUNTER — Encounter: Payer: Self-pay | Admitting: Internal Medicine

## 2020-03-31 ENCOUNTER — Telehealth: Payer: Self-pay

## 2020-03-31 NOTE — Telephone Encounter (Signed)
COVID-19 Pre-Screening Questions:03/31/20  Do you currently have a fever (>100 F), chills or unexplained body aches? NO   Are you currently experiencing new cough, shortness of breath, sore throat, runny nose? NO .  Have you recently travelled outside the state of [place] in the last 14 days? NO .  Have you been in contact with someone that is currently pending confirmation of Covid19 testing or has been confirmed to have the Covid19 virus?  NO  **If the patient answers NO to ALL questions -  advise the patient to please call the clinic before coming to the office should any symptoms develop.     

## 2020-04-01 ENCOUNTER — Ambulatory Visit: Payer: Medicare PPO | Admitting: Internal Medicine

## 2020-04-01 ENCOUNTER — Other Ambulatory Visit: Payer: Self-pay

## 2020-04-01 ENCOUNTER — Encounter: Payer: Self-pay | Admitting: Internal Medicine

## 2020-04-01 VITALS — BP 103/64 | HR 85 | Temp 98.1°F | Wt 104.0 lb

## 2020-04-01 DIAGNOSIS — R7881 Bacteremia: Secondary | ICD-10-CM | POA: Diagnosis not present

## 2020-04-01 DIAGNOSIS — B9561 Methicillin susceptible Staphylococcus aureus infection as the cause of diseases classified elsewhere: Secondary | ICD-10-CM | POA: Diagnosis not present

## 2020-04-01 DIAGNOSIS — G061 Intraspinal abscess and granuloma: Secondary | ICD-10-CM

## 2020-04-01 NOTE — Progress Notes (Signed)
[facility] for Infectious Disease  Patient Active Problem List   Diagnosis Date Noted  . Bacteremia due to methicillin susceptible Staphylococcus aureus (MSSA) 02/24/2020    Priority: High  . Abscess in epidural space of cervical spine     Priority: High  . Bacteremia 03/19/2020  . Chest pain   . Cellulitis and abscess of neck 02/21/2020  . Eosinophilic esophagitis 94/80/1655  . Depression   . Sepsis ([place])   . Anemia   . Dysphagia 09/22/2013  . Foreign body in esophagus 09/22/2013    Patient's Medications  New Prescriptions   No medications on file  Previous Medications   ACETAMINOPHEN (TYLENOL) 650 MG CR TABLET    Take 1,300 mg by mouth every 8 (eight) hours as needed for pain or fever.    ALPRAZOLAM (XANAX) 0.25 MG TABLET    Take 0.25 mg by mouth 2 (two) times daily as needed for anxiety or sleep.    BUDESONIDE (PULMICORT) 0.5 MG/2ML NEBULIZER SOLUTION    Take 1 mg by nebulization See admin instructions. Mix 1 mg (2 ampules) into 5 ml's of sugar-free simple syrup and drink as needed for symptoms of Eosinophilic esophagitis   CALCIUM CARBONATE-VITAMIN D 600-400 MG-UNIT CHEW TABLET    Chew 1 tablet by mouth daily.   CALCIUM CITRATE-VITAMIN D (CALCIUM CITRATE + D PO)    Take 2 tablets by mouth.   CAMPHOR-MENTHOL-METHYL SAL (SALONPAS) 3.12-10-08 % PTCH    Apply 2 patches topically See admin instructions. Apply 1 patch neck and chest wall as needed for pain.   CEFAZOLIN (ANCEF) IVPB    Inject 2 g into the vein every 8 (eight) hours. Indication: Discitis/ostemyelitis Last Day of Therapy:  05/13/20 Labs - Once weekly:  CBC/D and BMP, Labs - Every other week:  ESR and CRP   CYANOCOBALAMIN (VITAMIN B-12) 2500 MCG SUBL    Place 1,250 mcg under the tongue every 7 (seven) days.    FERROUS SULFATE DRIED (HIGH POTENCY IRON) 65 MG TABS    Take 65 mg by mouth daily.    GABAPENTIN (NEURONTIN) 100 MG CAPSULE    Take 100 mg by mouth 3 (three) times daily.   GINKGO BILOBA 120 MG TABS     Take 120 mg by mouth daily.   LOPERAMIDE (IMODIUM) 2 MG CAPSULE    Take 2 mg by mouth as needed for diarrhea or loose stools.   MISC NATURAL PRODUCTS (GLUCOSAMINE CHONDROITIN ADV PO)    Take 2 capsules by mouth in the morning and at bedtime.   MULTIPLE VITAMINS-MINERALS (DAILY MULTIVITAMIN) CAPS    Take 1 capsule by mouth daily.   PANTOPRAZOLE (PROTONIX) 40 MG TABLET    Take 40 mg by mouth 2 (two) times daily before a meal.   POLYETHYL GLYCOL-PROPYL GLYCOL (SYSTANE HYDRATION PF OP)    Place 1 drop into both eyes 3 (three) times daily as needed (for dry eyes, when not using Blink Tears).    POLYETHYLENE GLYCOL 400 (BLINK TEARS) 0.25 % SOLN    Place 1 drop into both eyes 3 (three) times daily as needed (for dry eyes, when not using Systane).    SOY ISOFLAVONE 40 MG TABS    Take 80 mg by mouth daily.    TRAZODONE (DESYREL) 50 MG TABLET    Patient takes 50 mg in the am and 100 mg at bedtime   TURMERIC PO    Take 2 capsules by mouth daily.   Modified Medications  No medications on file  Discontinued Medications   No medications on file    Subjective: Megan Cox is in for her hospital follow-up visit.  She was hospitalized in mid March with neck and upper back pain.  She was found to have a cervical epidural abscess and MSSA bacteremia on 02/21/2020.  Repeat blood cultures became negative and there is no clinical or TTE evidence of endocarditis.  She was discharged on IV cefazolin but started having fever and night sweats.  Repeat blood cultures on 03/10/2020 grew MSSA again leading to rehospitalization.  Her PICC line was removed.  Repeat blood cultures were negative as well as repeat TTE.  A new PICC line was placed and she was discharged again on IV cefazolin.  She has felt much better over the last week.  Her pain is under much better control with 1 tramadol at bedtime and acetaminophen each morning.  She is sleeping much better.  She is much more active.  She is riding her stationary bike at least 10  miles daily and yesterday she got and 6000 steps.  She has had no problems tolerating her PICC or cefazolin.  Review of Systems: Review of Systems  Constitutional: Negative for chills, diaphoresis and fever.  Respiratory: Negative for cough.   Cardiovascular: Negative for chest pain.  Gastrointestinal: Negative for abdominal pain, diarrhea, nausea and vomiting.       She has had 2-3 episodes of small amounts of soft stool.  Musculoskeletal: Positive for back pain and neck pain.  Psychiatric/Behavioral: The patient is nervous/anxious.     Past Medical History:  Diagnosis Date  . Depression   . Esophagitis   . MSSA bacteremia 03/2020    Social History   Tobacco Use  . Smoking status: Never Smoker  . Smokeless tobacco: Never Used  Substance Use Topics  . Alcohol use: No  . Drug use: Never    Family History  Problem Relation Age of Onset  . Breast cancer Mother 24    Allergies  Allergen Reactions  . Contrast Media [Iodinated Diagnostic Agents] Anaphylaxis, Shortness Of Breath and Swelling    PATIENT STATES THAT HER BROTHER HAD ANAPHYLAXIS WITH IV CONTRAST AND WANTS TO SHARE THIS FAMILY HISTORY. Brother died from reaction    Objective: Vitals:   04/01/20 1440  BP: 103/64  Pulse: 85  Temp: 98.1 F (36.7 C)  TempSrc: Oral  Weight: 104 lb (47.2 kg)   Body mass index is 19.02 kg/m.  Physical Exam Constitutional:      Comments: She looks like she is feeling much better than when I last saw her in the hospital.  She has lots of questions and is moderately anxious.  Cardiovascular:     Rate and Rhythm: Normal rate and regular rhythm.     Heart sounds: No murmur.  Pulmonary:     [place]: Pulmonary effort is normal.     Breath sounds: Normal breath sounds.  Skin:    Findings: No rash.  Neurological:     General: No focal deficit present.     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  Plan to treat her at least 6 weeks from her most recent negative culture until 04/29/2020 then consider conversion to oral cephalexin.          Michel Bickers, MD [facility] for Infectious [place] [facility] [phone] pager   [phone] cell 04/01/2020, 3:13 PM

## 2020-04-01 NOTE — Assessment & Plan Note (Signed)
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

## 2020-04-01 NOTE — Assessment & Plan Note (Signed)
Her repeat MRI showed some progression of her spine infection when she was rehospitalized that her clinical findings indicate that she is improving on therapy for MSSA spine infection.  Plan to treat her at least 6 weeks from her most recent negative culture until 04/29/2020 then consider conversion to oral cephalexin.

## 2020-04-04 DIAGNOSIS — L03221 Cellulitis of neck: Secondary | ICD-10-CM | POA: Diagnosis not present

## 2020-04-04 DIAGNOSIS — G061 Intraspinal abscess and granuloma: Secondary | ICD-10-CM | POA: Diagnosis not present

## 2020-04-04 DIAGNOSIS — B9561 Methicillin susceptible Staphylococcus aureus infection as the cause of diseases classified elsewhere: Secondary | ICD-10-CM | POA: Diagnosis not present

## 2020-04-06 ENCOUNTER — Encounter: Payer: Self-pay | Admitting: Internal Medicine

## 2020-04-06 DIAGNOSIS — M4642 Discitis, unspecified, cervical region: Secondary | ICD-10-CM | POA: Diagnosis not present

## 2020-04-06 DIAGNOSIS — A4101 Sepsis due to Methicillin susceptible Staphylococcus aureus: Secondary | ICD-10-CM | POA: Diagnosis not present

## 2020-04-06 DIAGNOSIS — M479 Spondylosis, unspecified: Secondary | ICD-10-CM | POA: Diagnosis not present

## 2020-04-06 DIAGNOSIS — Z452 Encounter for adjustment and management of vascular access device: Secondary | ICD-10-CM | POA: Diagnosis not present

## 2020-04-06 DIAGNOSIS — G061 Intraspinal abscess and granuloma: Secondary | ICD-10-CM | POA: Diagnosis not present

## 2020-04-06 DIAGNOSIS — M160 Bilateral primary osteoarthritis of hip: Secondary | ICD-10-CM | POA: Diagnosis not present

## 2020-04-06 DIAGNOSIS — L03221 Cellulitis of neck: Secondary | ICD-10-CM | POA: Diagnosis not present

## 2020-04-06 DIAGNOSIS — M19011 Primary osteoarthritis, right shoulder: Secondary | ICD-10-CM | POA: Diagnosis not present

## 2020-04-06 DIAGNOSIS — L0211 Cutaneous abscess of neck: Secondary | ICD-10-CM | POA: Diagnosis not present

## 2020-04-10 DIAGNOSIS — F418 Other specified anxiety disorders: Secondary | ICD-10-CM | POA: Diagnosis not present

## 2020-04-11 DIAGNOSIS — L03221 Cellulitis of neck: Secondary | ICD-10-CM | POA: Diagnosis not present

## 2020-04-11 DIAGNOSIS — B9561 Methicillin susceptible Staphylococcus aureus infection as the cause of diseases classified elsewhere: Secondary | ICD-10-CM | POA: Diagnosis not present

## 2020-04-11 DIAGNOSIS — G061 Intraspinal abscess and granuloma: Secondary | ICD-10-CM | POA: Diagnosis not present

## 2020-04-13 DIAGNOSIS — M19011 Primary osteoarthritis, right shoulder: Secondary | ICD-10-CM | POA: Diagnosis not present

## 2020-04-13 DIAGNOSIS — G061 Intraspinal abscess and granuloma: Secondary | ICD-10-CM | POA: Diagnosis not present

## 2020-04-13 DIAGNOSIS — Z452 Encounter for adjustment and management of vascular access device: Secondary | ICD-10-CM | POA: Diagnosis not present

## 2020-04-13 DIAGNOSIS — M160 Bilateral primary osteoarthritis of hip: Secondary | ICD-10-CM | POA: Diagnosis not present

## 2020-04-13 DIAGNOSIS — L03221 Cellulitis of neck: Secondary | ICD-10-CM | POA: Diagnosis not present

## 2020-04-13 DIAGNOSIS — L0211 Cutaneous abscess of neck: Secondary | ICD-10-CM | POA: Diagnosis not present

## 2020-04-13 DIAGNOSIS — A4101 Sepsis due to Methicillin susceptible Staphylococcus aureus: Secondary | ICD-10-CM | POA: Diagnosis not present

## 2020-04-13 DIAGNOSIS — M479 Spondylosis, unspecified: Secondary | ICD-10-CM | POA: Diagnosis not present

## 2020-04-13 DIAGNOSIS — M4642 Discitis, unspecified, cervical region: Secondary | ICD-10-CM | POA: Diagnosis not present

## 2020-04-17 DIAGNOSIS — F5101 Primary insomnia: Secondary | ICD-10-CM | POA: Diagnosis not present

## 2020-04-18 DIAGNOSIS — G061 Intraspinal abscess and granuloma: Secondary | ICD-10-CM | POA: Diagnosis not present

## 2020-04-18 DIAGNOSIS — B9561 Methicillin susceptible Staphylococcus aureus infection as the cause of diseases classified elsewhere: Secondary | ICD-10-CM | POA: Diagnosis not present

## 2020-04-18 DIAGNOSIS — L03221 Cellulitis of neck: Secondary | ICD-10-CM | POA: Diagnosis not present

## 2020-04-20 ENCOUNTER — Encounter: Payer: Self-pay | Admitting: Internal Medicine

## 2020-04-20 ENCOUNTER — Telehealth: Payer: Self-pay

## 2020-04-20 DIAGNOSIS — Z452 Encounter for adjustment and management of vascular access device: Secondary | ICD-10-CM | POA: Diagnosis not present

## 2020-04-20 DIAGNOSIS — L0211 Cutaneous abscess of neck: Secondary | ICD-10-CM | POA: Diagnosis not present

## 2020-04-20 DIAGNOSIS — L03221 Cellulitis of neck: Secondary | ICD-10-CM | POA: Diagnosis not present

## 2020-04-20 DIAGNOSIS — A4101 Sepsis due to Methicillin susceptible Staphylococcus aureus: Secondary | ICD-10-CM | POA: Diagnosis not present

## 2020-04-20 DIAGNOSIS — M160 Bilateral primary osteoarthritis of hip: Secondary | ICD-10-CM | POA: Diagnosis not present

## 2020-04-20 DIAGNOSIS — M19011 Primary osteoarthritis, right shoulder: Secondary | ICD-10-CM | POA: Diagnosis not present

## 2020-04-20 DIAGNOSIS — M479 Spondylosis, unspecified: Secondary | ICD-10-CM | POA: Diagnosis not present

## 2020-04-20 DIAGNOSIS — G061 Intraspinal abscess and granuloma: Secondary | ICD-10-CM | POA: Diagnosis not present

## 2020-04-20 DIAGNOSIS — M4642 Discitis, unspecified, cervical region: Secondary | ICD-10-CM | POA: Diagnosis not present

## 2020-04-20 NOTE — Telephone Encounter (Signed)
It is okay with me to recertify her for home health.

## 2020-04-20 NOTE — Telephone Encounter (Signed)
Received call today from Philemon Kingdom with [facility] with questions regarding patient's plan of care. Patient is coming up to the end of her 60 day episode and will need to recertify with home health to continue care. Patient will end this Wednesday if not renewed.  Landry Corporal would like to know if it is okay to renew. Will only have one visit if pt ends antibiotics on the 26th. Alternate plan is have patient end with home health this Wednesday and have picc pulled in office.  Will forward message to MD.  Patient would also like to inform MD she is feeling great. Is ready to stop IV all together.  [place]

## 2020-04-21 DIAGNOSIS — M4624 Osteomyelitis of vertebra, thoracic region: Secondary | ICD-10-CM | POA: Diagnosis not present

## 2020-04-21 DIAGNOSIS — M4622 Osteomyelitis of vertebra, cervical region: Secondary | ICD-10-CM | POA: Diagnosis not present

## 2020-04-21 DIAGNOSIS — M4802 Spinal stenosis, cervical region: Secondary | ICD-10-CM | POA: Diagnosis not present

## 2020-04-21 DIAGNOSIS — M542 Cervicalgia: Secondary | ICD-10-CM | POA: Diagnosis not present

## 2020-04-21 DIAGNOSIS — M546 Pain in thoracic spine: Secondary | ICD-10-CM | POA: Diagnosis not present

## 2020-04-22 DIAGNOSIS — M546 Pain in thoracic spine: Secondary | ICD-10-CM | POA: Insufficient documentation

## 2020-04-24 DIAGNOSIS — G061 Intraspinal abscess and granuloma: Secondary | ICD-10-CM | POA: Diagnosis not present

## 2020-04-24 DIAGNOSIS — A4101 Sepsis due to Methicillin susceptible Staphylococcus aureus: Secondary | ICD-10-CM | POA: Diagnosis not present

## 2020-04-24 DIAGNOSIS — M160 Bilateral primary osteoarthritis of hip: Secondary | ICD-10-CM | POA: Diagnosis not present

## 2020-04-24 DIAGNOSIS — M4642 Discitis, unspecified, cervical region: Secondary | ICD-10-CM | POA: Diagnosis not present

## 2020-04-24 DIAGNOSIS — L03221 Cellulitis of neck: Secondary | ICD-10-CM | POA: Diagnosis not present

## 2020-04-24 DIAGNOSIS — M19011 Primary osteoarthritis, right shoulder: Secondary | ICD-10-CM | POA: Diagnosis not present

## 2020-04-24 DIAGNOSIS — L0211 Cutaneous abscess of neck: Secondary | ICD-10-CM | POA: Diagnosis not present

## 2020-04-24 DIAGNOSIS — M479 Spondylosis, unspecified: Secondary | ICD-10-CM | POA: Diagnosis not present

## 2020-04-24 DIAGNOSIS — Z452 Encounter for adjustment and management of vascular access device: Secondary | ICD-10-CM | POA: Diagnosis not present

## 2020-04-25 DIAGNOSIS — L03221 Cellulitis of neck: Secondary | ICD-10-CM | POA: Diagnosis not present

## 2020-04-25 DIAGNOSIS — B9561 Methicillin susceptible Staphylococcus aureus infection as the cause of diseases classified elsewhere: Secondary | ICD-10-CM | POA: Diagnosis not present

## 2020-04-25 DIAGNOSIS — G061 Intraspinal abscess and granuloma: Secondary | ICD-10-CM | POA: Diagnosis not present

## 2020-04-26 DIAGNOSIS — L03221 Cellulitis of neck: Secondary | ICD-10-CM | POA: Diagnosis not present

## 2020-04-26 DIAGNOSIS — A4101 Sepsis due to Methicillin susceptible Staphylococcus aureus: Secondary | ICD-10-CM | POA: Diagnosis not present

## 2020-04-26 DIAGNOSIS — Z452 Encounter for adjustment and management of vascular access device: Secondary | ICD-10-CM | POA: Diagnosis not present

## 2020-04-26 DIAGNOSIS — M479 Spondylosis, unspecified: Secondary | ICD-10-CM | POA: Diagnosis not present

## 2020-04-26 DIAGNOSIS — M160 Bilateral primary osteoarthritis of hip: Secondary | ICD-10-CM | POA: Diagnosis not present

## 2020-04-26 DIAGNOSIS — M4642 Discitis, unspecified, cervical region: Secondary | ICD-10-CM | POA: Diagnosis not present

## 2020-04-26 DIAGNOSIS — L0211 Cutaneous abscess of neck: Secondary | ICD-10-CM | POA: Diagnosis not present

## 2020-04-26 DIAGNOSIS — G061 Intraspinal abscess and granuloma: Secondary | ICD-10-CM | POA: Diagnosis not present

## 2020-04-26 DIAGNOSIS — M19011 Primary osteoarthritis, right shoulder: Secondary | ICD-10-CM | POA: Diagnosis not present

## 2020-04-27 ENCOUNTER — Encounter: Payer: Self-pay | Admitting: Internal Medicine

## 2020-04-27 DIAGNOSIS — A4101 Sepsis due to Methicillin susceptible Staphylococcus aureus: Secondary | ICD-10-CM | POA: Diagnosis not present

## 2020-04-27 DIAGNOSIS — M160 Bilateral primary osteoarthritis of hip: Secondary | ICD-10-CM | POA: Diagnosis not present

## 2020-04-27 DIAGNOSIS — Z452 Encounter for adjustment and management of vascular access device: Secondary | ICD-10-CM | POA: Diagnosis not present

## 2020-04-27 DIAGNOSIS — M19011 Primary osteoarthritis, right shoulder: Secondary | ICD-10-CM | POA: Diagnosis not present

## 2020-04-27 DIAGNOSIS — L0211 Cutaneous abscess of neck: Secondary | ICD-10-CM | POA: Diagnosis not present

## 2020-04-27 DIAGNOSIS — L03221 Cellulitis of neck: Secondary | ICD-10-CM | POA: Diagnosis not present

## 2020-04-27 DIAGNOSIS — M479 Spondylosis, unspecified: Secondary | ICD-10-CM | POA: Diagnosis not present

## 2020-04-27 DIAGNOSIS — G061 Intraspinal abscess and granuloma: Secondary | ICD-10-CM | POA: Diagnosis not present

## 2020-04-27 DIAGNOSIS — M4642 Discitis, unspecified, cervical region: Secondary | ICD-10-CM | POA: Diagnosis not present

## 2020-04-28 ENCOUNTER — Ambulatory Visit: Payer: Medicare PPO | Admitting: Internal Medicine

## 2020-04-28 ENCOUNTER — Telehealth: Payer: Self-pay

## 2020-04-28 NOTE — Telephone Encounter (Signed)
COVID-19 Pre-Screening Questions:04/28/20  Do you currently have a fever (>100 F), chills or unexplained body aches? NO  Are you currently experiencing new cough, shortness of breath, sore throat, runny nose? NO .  Have you recently travelled outside the state of [place] in the last 14 days? NO .  Have you been in contact with someone that is currently pending confirmation of Covid19 testing or has been confirmed to have the Covid19 virus? NO  **If the patient answers NO to ALL questions -  advise the patient to please call the clinic before coming to the office should any symptoms develop.     

## 2020-04-29 ENCOUNTER — Ambulatory Visit: Payer: Medicare PPO | Admitting: Internal Medicine

## 2020-04-29 ENCOUNTER — Encounter: Payer: Self-pay | Admitting: Internal Medicine

## 2020-04-29 ENCOUNTER — Other Ambulatory Visit: Payer: Self-pay

## 2020-04-29 DIAGNOSIS — G061 Intraspinal abscess and granuloma: Secondary | ICD-10-CM | POA: Diagnosis not present

## 2020-04-29 NOTE — Progress Notes (Signed)
Per verbal order from Dr Megan Salon, 34 cm Single Lumen Peripherally Inserted Central Catheter removed from right basilic, tip intact. No sutures present. RN confirmed length per chart. Dressing was clean and dry. Petroleum dressing applied. Pt advised no heavy lifting with this arm, leave dressing for 24 hours and call the office or seek emergent care if dressing becomes soaked with blood or sharp pain presents. Patient verbalized understanding and agreement.  Patient's questions answered to their satisfaction. Patient tolerated procedure well, RN walked patient to check out. Pharmacies notified. Landis Gandy, RN

## 2020-04-29 NOTE — Progress Notes (Signed)
[facility]  Patient Active Problem List   Diagnosis Date Noted  . Bacteremia due to methicillin susceptible Staphylococcus aureus (MSSA) 02/24/2020    Priority: High  . Abscess in epidural space of cervical spine     Priority: High  . Bacteremia 03/19/2020  . Chest pain   . Cellulitis and abscess of neck 02/21/2020  . Eosinophilic esophagitis 16/09/9603  . Depression   . Sepsis ([place])   . Anemia   . Dysphagia 09/22/2013  . Foreign body in esophagus 09/22/2013    Patient's Medications  New Prescriptions   No medications on file  Previous Medications   ACETAMINOPHEN (TYLENOL) 650 MG CR TABLET    Take 1,300 mg by mouth every 8 (eight) hours as needed for pain or fever. 1TAB DAILY 500MG   ALPRAZOLAM (XANAX) 0.25 MG TABLET    Take 0.25 mg by mouth 2 (two) times daily as needed for anxiety or sleep.    BUDESONIDE (PULMICORT) 0.5 MG/2ML NEBULIZER SOLUTION    Take 1 mg by nebulization See admin instructions. Mix 1 mg (2 ampules) into 5 ml's of sugar-free simple syrup and drink as needed for symptoms of Eosinophilic esophagitis   CALCIUM CARBONATE-VITAMIN D 600-400 MG-UNIT CHEW TABLET    Chew 1 tablet by mouth daily.   CALCIUM CITRATE-VITAMIN D (CALCIUM CITRATE + D PO)    Take 2 tablets by mouth.   CAMPHOR-MENTHOL-METHYL SAL (SALONPAS) 3.12-10-08 % PTCH    Apply 2 patches topically See admin instructions. Apply 1 patch neck and chest wall as needed for pain.   CYANOCOBALAMIN (VITAMIN B-12) 2500 MCG SUBL    Place 1,250 mcg under the tongue every 7 (seven) days.    FERROUS SULFATE DRIED (HIGH POTENCY IRON) 65 MG TABS    Take 65 mg by mouth daily.    GINKGO BILOBA 120 MG TABS    Take 120 mg by mouth daily.   MISC NATURAL PRODUCTS (GLUCOSAMINE CHONDROITIN ADV PO)    Take 2 capsules by mouth in the morning and at bedtime.   MULTIPLE VITAMINS-MINERALS (DAILY MULTIVITAMIN) CAPS    Take 1 capsule by mouth daily.   PANTOPRAZOLE (PROTONIX) 40 MG TABLET    Take 40  mg by mouth 2 (two) times daily before a meal.   POLYETHYL GLYCOL-PROPYL GLYCOL (SYSTANE HYDRATION PF OP)    Place 1 drop into both eyes 3 (three) times daily as needed (for dry eyes, when not using Blink Tears).    POLYETHYLENE GLYCOL 400 (BLINK TEARS) 0.25 % SOLN    Place 1 drop into both eyes 3 (three) times daily as needed (for dry eyes, when not using Systane).    SOY ISOFLAVONE 40 MG TABS    Take 80 mg by mouth daily.    TRAZODONE (DESYREL) 50 MG TABLET    Patient takes 50 mg in the am and 100 mg at bedtime   TURMERIC PO    Take 2 capsules by mouth daily.   Modified Medications   No medications on file  Discontinued Medications   CEFAZOLIN (ANCEF) IVPB    Inject 2 g into the vein every 8 (eight) hours. Indication: Discitis/ostemyelitis Last Day of Therapy:  05/13/20 Labs - Once weekly:  CBC/D and BMP, Labs - Every other week:  ESR and CRP   GABAPENTIN (NEURONTIN) 100 MG CAPSULE    Take 100 mg by mouth 3 (three) times daily.   LOPERAMIDE (IMODIUM) 2 MG CAPSULE    Take  2 mg by mouth as needed for diarrhea or loose stools.    Subjective: Lauree is in for her hospital follow-up visit.  She was hospitalized in mid March with neck and upper back pain.  She was found to have a cervical epidural abscess and MSSA bacteremia on 02/21/2020.  Repeat blood cultures became negative and there is no clinical or TTE evidence of endocarditis.  She was discharged on IV cefazolin but started having fever and night sweats.  Repeat blood cultures on 03/10/2020 grew MSSA again leading to rehospitalization.  Her PICC line was removed.  Repeat blood cultures were negative as well as repeat TTE.  A new PICC line was placed and she was discharged again on IV cefazolin.  She has now completed 64 days of total antibiotic therapy including 6 weeks of therapy since her last positive blood culture.   Review of Systems: Review of Systems  Constitutional: Negative for chills, diaphoresis and fever.  Respiratory: Negative for  cough.   Cardiovascular: Negative for chest pain.  Gastrointestinal: Negative for abdominal pain, diarrhea, nausea and vomiting.       She has had 2-3 episodes of small amounts of soft stool.  Musculoskeletal: Negative for back pain and neck pain.  Psychiatric/Behavioral: The patient is not nervous/anxious.     Past Medical History:  Diagnosis Date  . Depression   . Esophagitis   . MSSA bacteremia 03/2020    Social History   Tobacco Use  . Smoking status: Never Smoker  . Smokeless tobacco: Never Used  Substance Use Topics  . Alcohol use: No  . Drug use: Never    Family History  Problem Relation Age of Onset  . Breast cancer Mother 76    Allergies  Allergen Reactions  . Contrast Media [Iodinated Diagnostic Agents] Anaphylaxis, Shortness Of Breath and Swelling    PATIENT STATES THAT HER BROTHER HAD ANAPHYLAXIS WITH IV CONTRAST AND WANTS TO SHARE THIS FAMILY HISTORY. Brother died from reaction    Objective: Vitals:   04/29/20 1446  BP: 121/72  Pulse: 80  Temp: 98.2 F (36.8 C)  SpO2: 98%  Height: 5' 2"  (1.575 m)   Body mass index is 19.02 kg/m.  Physical Exam Constitutional:      Comments: She is in good spirits.  Musculoskeletal:     Cervical back: Neck supple.  Skin:    Findings: No rash.  Neurological:     General: No focal deficit present.  Psychiatric:        Mood and Affect: Mood normal.     Lab Results 04/20/2020 Sedimentation rate 20 C-reactive protein less than 1    Problem List Items Addressed This Visit      High   Abscess in epidural space of cervical spine    I am quite hopeful that her MSSA bacteremia and cervical spine infection have now been cured.  She will have her PICC line removed and stop cefazolin now.  She will follow-up here in 6 weeks.          Michel Bickers, MD [facility] for [facility] [phone] pager   [phone] cell 04/29/2020, 3:53 PM

## 2020-04-29 NOTE — Assessment & Plan Note (Signed)
I am quite hopeful that her MSSA bacteremia and cervical spine infection have now been cured.  She will have her PICC line removed and stop cefazolin now.  She will follow-up here in 6 weeks.

## 2020-05-01 DIAGNOSIS — F418 Other specified anxiety disorders: Secondary | ICD-10-CM | POA: Diagnosis not present

## 2020-05-07 ENCOUNTER — Encounter: Payer: Self-pay | Admitting: Internal Medicine

## 2020-05-07 ENCOUNTER — Ambulatory Visit: Payer: Medicare PPO | Admitting: Internal Medicine

## 2020-05-07 ENCOUNTER — Telehealth: Payer: Self-pay | Admitting: *Deleted

## 2020-05-07 ENCOUNTER — Other Ambulatory Visit: Payer: Self-pay

## 2020-05-07 DIAGNOSIS — R61 Generalized hyperhidrosis: Secondary | ICD-10-CM | POA: Diagnosis not present

## 2020-05-07 NOTE — Telephone Encounter (Signed)
Scheduled for 6/3 at 2:30.

## 2020-05-07 NOTE — Progress Notes (Addendum)
[facility] for Infectious Disease  Patient Active Problem List   Diagnosis Date Noted  . Night sweats 05/07/2020    Priority: High  . Bacteremia due to methicillin susceptible Staphylococcus aureus (MSSA) 02/24/2020    Priority: Medium  . Abscess in epidural space of cervical spine     Priority: Medium  . Bacteremia 03/19/2020  . Chest pain   . Cellulitis and abscess of neck 02/21/2020  . Eosinophilic esophagitis [id number]  . Depression   . Sepsis ([place])   . Anemia   . Dysphagia 09/22/2013  . Foreign body in esophagus 09/22/2013    Patient's Medications  New Prescriptions   No medications on file  Previous Medications   ACETAMINOPHEN (TYLENOL) 650 MG CR TABLET    Take 1,300 mg by mouth every 8 (eight) hours as needed for pain or fever. 1TAB DAILY 500MG    ALPRAZOLAM (XANAX) 0.25 MG TABLET    Take 0.25 mg by mouth 2 (two) times daily as needed for anxiety or sleep.    BUDESONIDE (PULMICORT) 0.5 MG/2ML NEBULIZER SOLUTION    Take 1 mg by nebulization See admin instructions. Mix 1 mg (2 ampules) into 5 ml's of sugar-free simple syrup and drink as needed for symptoms of Eosinophilic esophagitis   CALCIUM CARBONATE-VITAMIN D 600-400 MG-UNIT CHEW TABLET    Chew 1 tablet by mouth daily.   CALCIUM CITRATE-VITAMIN D (CALCIUM CITRATE + D PO)    Take 2 tablets by mouth.   CYANOCOBALAMIN (VITAMIN B-12) 2500 MCG SUBL    Place 1,250 mcg under the tongue every 7 (seven) days.    FERROUS SULFATE DRIED (HIGH POTENCY IRON) 65 MG TABS    Take 65 mg by mouth daily.    GINKGO BILOBA 120 MG TABS    Take 120 mg by mouth daily.   MISC NATURAL PRODUCTS (GLUCOSAMINE CHONDROITIN ADV PO)    Take 2 capsules by mouth in the morning and at bedtime.   MULTIPLE VITAMINS-MINERALS (DAILY MULTIVITAMIN) CAPS    Take 1 capsule by mouth daily.   PANTOPRAZOLE (PROTONIX) 40 MG TABLET    Take 40 mg by mouth 2 (two) times daily before a meal.   POLYETHYL GLYCOL-PROPYL GLYCOL (SYSTANE HYDRATION PF OP)     Place 1 drop into both eyes 3 (three) times daily as needed (for dry eyes, when not using Blink Tears).    POLYETHYLENE GLYCOL 400 (BLINK TEARS) 0.25 % SOLN    Place 1 drop into both eyes 3 (three) times daily as needed (for dry eyes, when not using Systane).    PROBIOTIC PRODUCT (PROBIOTIC DAILY PO)    Take by mouth.   SOY ISOFLAVONE 40 MG TABS    Take 80 mg by mouth daily.    TRAZODONE (DESYREL) 50 MG TABLET    Patient takes 50 mg in the am and 100 mg at bedtime   TURMERIC PO    Take 2 capsules by mouth daily.   Modified Medications   No medications on file  Discontinued Medications   CAMPHOR-MENTHOL-METHYL SAL (SALONPAS) 3.12-10-08 % PTCH    Apply 2 patches topically See admin instructions. Apply 1 patch neck and chest wall as needed for pain.    Subjective: Megan Cox is seen for a working visit.  She completed 64 days of IV cefazolin on 04/29/2020 for her MSSA bacteremia and cervical epidural abscess.  Her bacteremia had cleared, her neck pain had resolved, and her inflammatory markers had normalized at that time.  She  has been having some night sweats ever since she started cefazolin.  Since stopping antibiotics she has continued to be bothered by night sweats.  When she woke up this morning with a sweat her temperature was 100.1 degrees.  She has also been checking her blood pressure and heart rate with a new fitness watch and has been concerned that her blood pressure has been higher than normal, around 125/75.  She has not had any recurrent neck pain.  Last week she developed a pruritic papule on her right hip that went away spontaneously in about 24 hours.  Review of Systems: Review of Systems  Constitutional: Positive for diaphoresis and fever. Negative for chills, malaise/fatigue and weight loss.  HENT: Negative for sore throat.   Respiratory: Negative for cough and shortness of breath.   Cardiovascular: Negative for chest pain.  Gastrointestinal: Negative for abdominal pain, diarrhea,  nausea and vomiting.  Musculoskeletal: Negative for back pain, joint pain and neck pain.    Past Medical History:  Diagnosis Date  . Depression   . Esophagitis   . MSSA bacteremia 03/2020    Social History   Tobacco Use  . Smoking status: Never Smoker  . Smokeless tobacco: Never Used  Substance Use Topics  . Alcohol use: No  . Drug use: Never    Family History  Problem Relation Age of Onset  . Breast cancer Mother 66    Allergies  Allergen Reactions  . Contrast Media [Iodinated Diagnostic Agents] Anaphylaxis, Shortness Of Breath and Swelling    PATIENT STATES THAT HER BROTHER HAD ANAPHYLAXIS WITH IV CONTRAST AND WANTS TO SHARE THIS FAMILY HISTORY. Brother died from reaction    Objective: Vitals:   05/07/20 1427 05/07/20 1502 05/07/20 1503  BP: 106/65 101/62 111/69  Pulse: 89    Temp: 97.6 F (36.4 C)    SpO2: 98%    Height: 5\' 2"  (1.575 m)     Body mass index is 19.02 kg/m.  Physical Exam Constitutional:      Comments: She is pleasant and talkative as usual.  Our blood pressure readings were significantly lower than the readings she was getting with her fitness watch.  Cardiovascular:     Rate and Rhythm: Normal rate and regular rhythm.     Heart sounds: No murmur.  Pulmonary:     Effort: Pulmonary effort is normal.     Breath sounds: Normal breath sounds.  Abdominal:     Palpations: Abdomen is soft.     Tenderness: There is no abdominal tenderness.  Musculoskeletal:        General: No swelling or tenderness.  Skin:    Findings: No rash.  Psychiatric:        Mood and Affect: Mood normal.     Lab Results 05/07/2020 CMP     Component Value Date/Time   NA 139 05/07/2020 1508   K 3.9 05/07/2020 1508   CL 101 05/07/2020 1508   CO2 27 05/07/2020 1508   GLUCOSE 120 (H) 05/07/2020 1508   BUN 17 05/07/2020 1508   CREATININE 0.93 05/07/2020 1508   CALCIUM 9.1 05/07/2020 1508   PROT 5.9 (L) 02/23/2020 0529   ALBUMIN 2.1 (L) 02/23/2020 0529   AST 26  02/23/2020 0529   ALT 22 02/23/2020 0529   ALKPHOS 92 02/23/2020 0529   BILITOT 0.8 02/23/2020 0529   GFRNONAA >60 03/19/2020 0813   GFRAA >60 03/19/2020 0813   Lab Results  Component Value Date   WBC 7.4 05/07/2020   HGB  11.3 (L) 05/07/2020   HCT 33.7 (L) 05/07/2020   MCV 89.2 05/07/2020   PLT 245 05/07/2020   Sed Rate  Date Value  05/07/2020 33 mm/h (H)  03/20/2020 68 mm/hr (H)   CRP  Date Value  05/07/2020 74.8 mg/L (H)  03/20/2020 0.8 mg/dL   Blood cultures negative     Problem List Items Addressed This Visit      High   Night sweats    I am not sure what is causing her persistent night sweats.  All other indicators suggest that her staph infection has been cured.  Will check repeat blood cultures, CBC with differential, BMP, sed rate and C-reactive protein.  Addendum: I spoke to Akeema today.  She tells me that she has not had any fever and her night sweats have resolved.  She has occasional twinges of pain in her back but nothing like the constant severe pain she had when she went into the hospital.  Her hemoglobin continues to rise and her blood cultures are negative.  Her inflammatory markers remain elevated but I emphasized to her that the results of her C-reactive protein are not absolutely comparable because they were done on different platforms.  Taking everything into account I think that she is doing much better and I am still very hopeful that her staph infection has been cured.  She will follow-up with me in 1 month.      Relevant Orders   Culture, blood (single) (Completed)   Culture, blood (single) (Completed)   Basic metabolic panel (Completed)   C-reactive protein (Completed)   Sedimentation rate (Completed)   CBC with Differential/Platelet       Michel Bickers, MD [facility] [phone] pager   [phone] cell 05/11/2020, 1:01 PM

## 2020-05-07 NOTE — Telephone Encounter (Signed)
Patient still with continuing night sweats and blood pressure issues since her antibiotics ended 04/29/20.  Reports heavy night sweat over night, temperature was 100.0, 99.1 after the event woke her up. She takes her temperature with 2 different thermometers (an 8 second and a 60 second thermometer for comparison).  Her blood pressure and heart rate have been elevated when at rest.  She has regular follow up scheduled with Dr Laurann Montana on 6/7, but wanted Dr Megan Salon to be aware. She is worried that perhaps her infection has not cleared because she has not returned to her baseline as of yet.  She has returned to normal physical activity and her GI tract is better. She is just worried about these lingering issues.   Landis Gandy, RN

## 2020-05-07 NOTE — Telephone Encounter (Signed)
Please see if she can come see me this afternoon.

## 2020-05-07 NOTE — Assessment & Plan Note (Addendum)
I am not sure what is causing her persistent night sweats.  All other indicators suggest that her staph infection has been cured.  Will check repeat blood cultures, CBC with differential, BMP, sed rate and C-reactive protein.  Addendum: I spoke to Megan Cox today.  She tells me that she has not had any fever and her night sweats have resolved.  She has occasional twinges of pain in her back but nothing like the constant severe pain she had when she went into the hospital.  Her hemoglobin continues to rise and her blood cultures are negative.  Her inflammatory markers remain elevated but I emphasized to her that the results of her C-reactive protein are not absolutely comparable because they were done on different platforms.  Taking everything into account I think that she is doing much better and I am still very hopeful that her staph infection has been cured.  She will follow-up with me in 1 month.

## 2020-05-07 NOTE — Telephone Encounter (Signed)
Thank you :)

## 2020-05-11 ENCOUNTER — Telehealth: Payer: Self-pay

## 2020-05-11 DIAGNOSIS — M4622 Osteomyelitis of vertebra, cervical region: Secondary | ICD-10-CM | POA: Diagnosis not present

## 2020-05-11 NOTE — Telephone Encounter (Signed)
I called and spoke with Megan Cox about her lab results.  Taking everything into account I believe that she is doing much better.  I am quite hopeful that her staph infection has been cured.

## 2020-05-11 NOTE — Telephone Encounter (Signed)
Patient called triage to update provider on night sweats and fevers. Reports that she has not had any fevers since being seen last week. She has increased the medication for the night sweats and they have resolved since doing so. She did notice that her CRP has increased and was concerned about infection recurrence. Will forward to provider.  Lala Been Lorita Officer, RN

## 2020-05-13 LAB — CULTURE, BLOOD (SINGLE)
MICRO NUMBER:: 10550292
MICRO NUMBER:: 10550301

## 2020-05-13 LAB — CBC WITH DIFFERENTIAL/PLATELET
Absolute Monocytes: 747 cells/uL (ref 200–950)
Basophils Absolute: 30 cells/uL (ref 0–200)
Basophils Relative: 0.4 %
Eosinophils Absolute: 111 cells/uL (ref 15–500)
Eosinophils Relative: 1.5 %
HCT: 33.7 % — ABNORMAL LOW (ref 35.0–45.0)
Hemoglobin: 11.3 g/dL — ABNORMAL LOW (ref 11.7–15.5)
Lymphs Abs: 1339 cells/uL (ref 850–3900)
MCH: 29.9 pg (ref 27.0–33.0)
MCHC: 33.5 g/dL (ref 32.0–36.0)
MCV: 89.2 fL (ref 80.0–100.0)
MPV: 9.8 fL (ref 7.5–12.5)
Monocytes Relative: 10.1 %
Neutro Abs: 5173 cells/uL (ref 1500–7800)
Neutrophils Relative %: 69.9 %
Platelets: 245 10*3/uL (ref 140–400)
RBC: 3.78 10*6/uL — ABNORMAL LOW (ref 3.80–5.10)
RDW: 14.7 % (ref 11.0–15.0)
Total Lymphocyte: 18.1 %
WBC: 7.4 10*3/uL (ref 3.8–10.8)

## 2020-05-13 LAB — C-REACTIVE PROTEIN: CRP: 74.8 mg/L — ABNORMAL HIGH (ref <8.0)

## 2020-05-13 LAB — BASIC METABOLIC PANEL
BUN: 17 mg/dL (ref 7–25)
CO2: 27 mmol/L (ref 20–32)
Calcium: 9.1 mg/dL (ref 8.6–10.4)
Chloride: 101 mmol/L (ref 98–110)
Creat: 0.93 mg/dL (ref 0.50–0.99)
Glucose, Bld: 120 mg/dL — ABNORMAL HIGH (ref 65–99)
Potassium: 3.9 mmol/L (ref 3.5–5.3)
Sodium: 139 mmol/L (ref 135–146)

## 2020-05-13 LAB — SEDIMENTATION RATE: Sed Rate: 33 mm/h — ABNORMAL HIGH (ref 0–30)

## 2020-05-20 DIAGNOSIS — M546 Pain in thoracic spine: Secondary | ICD-10-CM | POA: Diagnosis not present

## 2020-05-20 DIAGNOSIS — M542 Cervicalgia: Secondary | ICD-10-CM | POA: Diagnosis not present

## 2020-05-21 DIAGNOSIS — F418 Other specified anxiety disorders: Secondary | ICD-10-CM | POA: Diagnosis not present

## 2020-05-22 DIAGNOSIS — M542 Cervicalgia: Secondary | ICD-10-CM | POA: Diagnosis not present

## 2020-05-29 DIAGNOSIS — M542 Cervicalgia: Secondary | ICD-10-CM | POA: Diagnosis not present

## 2020-06-04 DIAGNOSIS — F418 Other specified anxiety disorders: Secondary | ICD-10-CM | POA: Diagnosis not present

## 2020-06-05 DIAGNOSIS — M542 Cervicalgia: Secondary | ICD-10-CM | POA: Diagnosis not present

## 2020-06-12 DIAGNOSIS — M542 Cervicalgia: Secondary | ICD-10-CM | POA: Diagnosis not present

## 2020-06-18 ENCOUNTER — Ambulatory Visit: Payer: Medicare PPO | Admitting: Internal Medicine

## 2020-06-19 DIAGNOSIS — M542 Cervicalgia: Secondary | ICD-10-CM | POA: Diagnosis not present

## 2020-06-23 DIAGNOSIS — Z872 Personal history of diseases of the skin and subcutaneous tissue: Secondary | ICD-10-CM | POA: Diagnosis not present

## 2020-06-23 DIAGNOSIS — D225 Melanocytic nevi of trunk: Secondary | ICD-10-CM | POA: Diagnosis not present

## 2020-06-23 DIAGNOSIS — L814 Other melanin hyperpigmentation: Secondary | ICD-10-CM | POA: Diagnosis not present

## 2020-06-23 DIAGNOSIS — D1801 Hemangioma of skin and subcutaneous tissue: Secondary | ICD-10-CM | POA: Diagnosis not present

## 2020-06-23 DIAGNOSIS — L821 Other seborrheic keratosis: Secondary | ICD-10-CM | POA: Diagnosis not present

## 2020-06-23 DIAGNOSIS — L905 Scar conditions and fibrosis of skin: Secondary | ICD-10-CM | POA: Diagnosis not present

## 2020-06-24 DIAGNOSIS — H900 Conductive hearing loss, bilateral: Secondary | ICD-10-CM | POA: Diagnosis not present

## 2020-06-24 DIAGNOSIS — H6123 Impacted cerumen, bilateral: Secondary | ICD-10-CM | POA: Diagnosis not present

## 2020-06-30 DIAGNOSIS — M542 Cervicalgia: Secondary | ICD-10-CM | POA: Diagnosis not present

## 2020-07-06 DIAGNOSIS — M19049 Primary osteoarthritis, unspecified hand: Secondary | ICD-10-CM | POA: Insufficient documentation

## 2020-07-06 DIAGNOSIS — M13842 Other specified arthritis, left hand: Secondary | ICD-10-CM | POA: Diagnosis not present

## 2020-07-06 DIAGNOSIS — M13841 Other specified arthritis, right hand: Secondary | ICD-10-CM | POA: Diagnosis not present

## 2020-07-06 DIAGNOSIS — M65332 Trigger finger, left middle finger: Secondary | ICD-10-CM | POA: Diagnosis not present

## 2020-07-06 DIAGNOSIS — M79642 Pain in left hand: Secondary | ICD-10-CM | POA: Diagnosis not present

## 2020-07-10 DIAGNOSIS — M542 Cervicalgia: Secondary | ICD-10-CM | POA: Diagnosis not present

## 2020-07-15 DIAGNOSIS — Z1389 Encounter for screening for other disorder: Secondary | ICD-10-CM | POA: Diagnosis not present

## 2020-07-15 DIAGNOSIS — F5101 Primary insomnia: Secondary | ICD-10-CM | POA: Diagnosis not present

## 2020-07-15 DIAGNOSIS — Z0000 Encounter for general adult medical examination without abnormal findings: Secondary | ICD-10-CM | POA: Diagnosis not present

## 2020-07-15 DIAGNOSIS — K219 Gastro-esophageal reflux disease without esophagitis: Secondary | ICD-10-CM | POA: Diagnosis not present

## 2020-07-15 DIAGNOSIS — E7800 Pure hypercholesterolemia, unspecified: Secondary | ICD-10-CM | POA: Diagnosis not present

## 2020-07-15 DIAGNOSIS — F419 Anxiety disorder, unspecified: Secondary | ICD-10-CM | POA: Diagnosis not present

## 2020-07-15 DIAGNOSIS — M8580 Other specified disorders of bone density and structure, unspecified site: Secondary | ICD-10-CM | POA: Diagnosis not present

## 2020-07-15 DIAGNOSIS — K200 Eosinophilic esophagitis: Secondary | ICD-10-CM | POA: Diagnosis not present

## 2020-07-17 DIAGNOSIS — M542 Cervicalgia: Secondary | ICD-10-CM | POA: Diagnosis not present

## 2020-07-24 DIAGNOSIS — M542 Cervicalgia: Secondary | ICD-10-CM | POA: Diagnosis not present

## 2020-08-06 DIAGNOSIS — M79642 Pain in left hand: Secondary | ICD-10-CM | POA: Diagnosis not present

## 2020-08-06 DIAGNOSIS — M542 Cervicalgia: Secondary | ICD-10-CM | POA: Diagnosis not present

## 2020-08-06 DIAGNOSIS — M79641 Pain in right hand: Secondary | ICD-10-CM | POA: Diagnosis not present

## 2020-08-06 DIAGNOSIS — M65332 Trigger finger, left middle finger: Secondary | ICD-10-CM | POA: Diagnosis not present

## 2020-08-06 DIAGNOSIS — M13842 Other specified arthritis, left hand: Secondary | ICD-10-CM | POA: Diagnosis not present

## 2020-08-06 DIAGNOSIS — M13841 Other specified arthritis, right hand: Secondary | ICD-10-CM | POA: Diagnosis not present

## 2020-08-21 DIAGNOSIS — F418 Other specified anxiety disorders: Secondary | ICD-10-CM | POA: Diagnosis not present

## 2020-08-28 DIAGNOSIS — F418 Other specified anxiety disorders: Secondary | ICD-10-CM | POA: Diagnosis not present

## 2020-09-03 DIAGNOSIS — S76012A Strain of muscle, fascia and tendon of left hip, initial encounter: Secondary | ICD-10-CM | POA: Diagnosis not present

## 2020-09-10 DIAGNOSIS — F418 Other specified anxiety disorders: Secondary | ICD-10-CM | POA: Diagnosis not present

## 2020-09-25 DIAGNOSIS — F418 Other specified anxiety disorders: Secondary | ICD-10-CM | POA: Diagnosis not present

## 2020-10-15 DIAGNOSIS — F418 Other specified anxiety disorders: Secondary | ICD-10-CM | POA: Diagnosis not present

## 2020-10-19 DIAGNOSIS — M25552 Pain in left hip: Secondary | ICD-10-CM | POA: Diagnosis not present

## 2020-10-19 DIAGNOSIS — M25561 Pain in right knee: Secondary | ICD-10-CM | POA: Diagnosis not present

## 2020-11-05 DIAGNOSIS — F418 Other specified anxiety disorders: Secondary | ICD-10-CM | POA: Diagnosis not present

## 2020-11-26 DIAGNOSIS — F418 Other specified anxiety disorders: Secondary | ICD-10-CM | POA: Diagnosis not present

## 2020-12-02 ENCOUNTER — Other Ambulatory Visit: Payer: Self-pay | Admitting: Obstetrics and Gynecology

## 2020-12-02 DIAGNOSIS — Z1231 Encounter for screening mammogram for malignant neoplasm of breast: Secondary | ICD-10-CM

## 2020-12-23 DIAGNOSIS — H6123 Impacted cerumen, bilateral: Secondary | ICD-10-CM | POA: Diagnosis not present

## 2020-12-23 DIAGNOSIS — H900 Conductive hearing loss, bilateral: Secondary | ICD-10-CM | POA: Diagnosis not present

## 2021-01-01 DIAGNOSIS — F418 Other specified anxiety disorders: Secondary | ICD-10-CM | POA: Diagnosis not present

## 2021-01-08 DIAGNOSIS — F418 Other specified anxiety disorders: Secondary | ICD-10-CM | POA: Diagnosis not present

## 2021-01-12 ENCOUNTER — Other Ambulatory Visit: Payer: Self-pay

## 2021-01-12 ENCOUNTER — Ambulatory Visit
Admission: RE | Admit: 2021-01-12 | Discharge: 2021-01-12 | Disposition: A | Discharge status: Home / Self Care | Payer: Medicare PPO | Source: Ambulatory Visit | Attending: Obstetrics and Gynecology | Admitting: Obstetrics and Gynecology

## 2021-01-12 DIAGNOSIS — Z1231 Encounter for screening mammogram for malignant neoplasm of breast: Secondary | ICD-10-CM

## 2021-01-12 IMAGING — MG MM DIGITAL SCREENING BILAT W/ TOMO AND CAD
8 series · 9 of 24 positions shown · non-contrast
Comparison: Previous exam(s).

CLINICAL DATA: Screening.

EXAM:
DIGITAL SCREENING BILATERAL MAMMOGRAM WITH TOMOSYNTHESIS AND CAD
TECHNIQUE: Bilateral screening digital craniocaudal and mediolateral oblique
mammograms were obtained. Bilateral screening digital breast
tomosynthesis was performed. The images were evaluated with
computer-aided detection.

[R CC synth-2D]
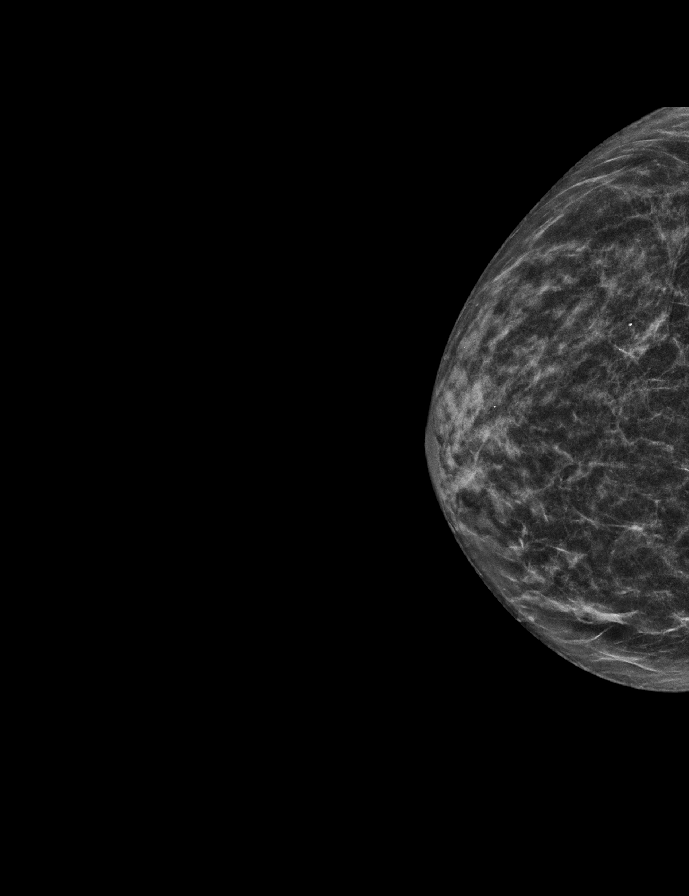

[R MLO synth-2D]
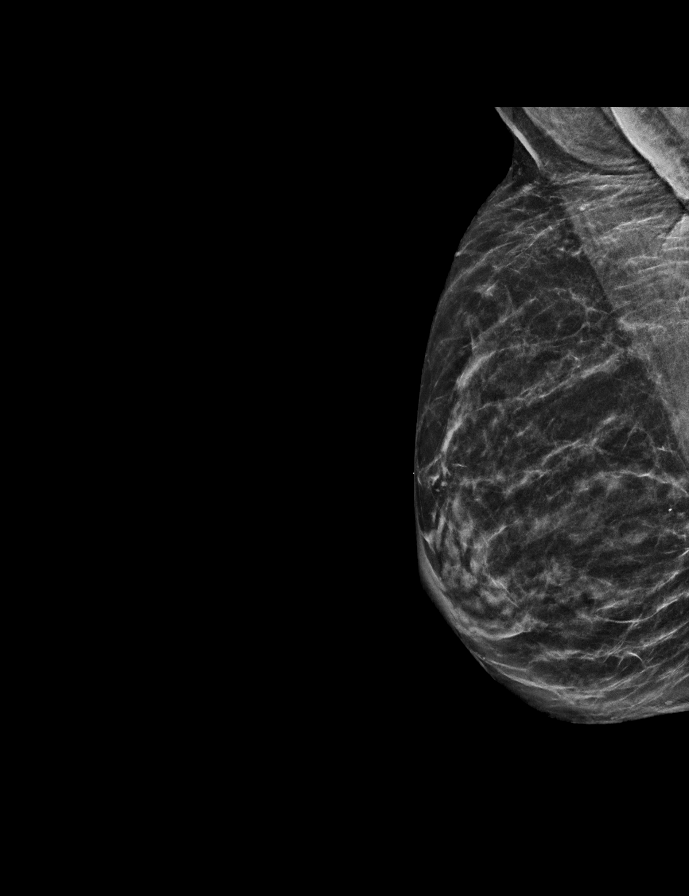

[L CC synth-2D]
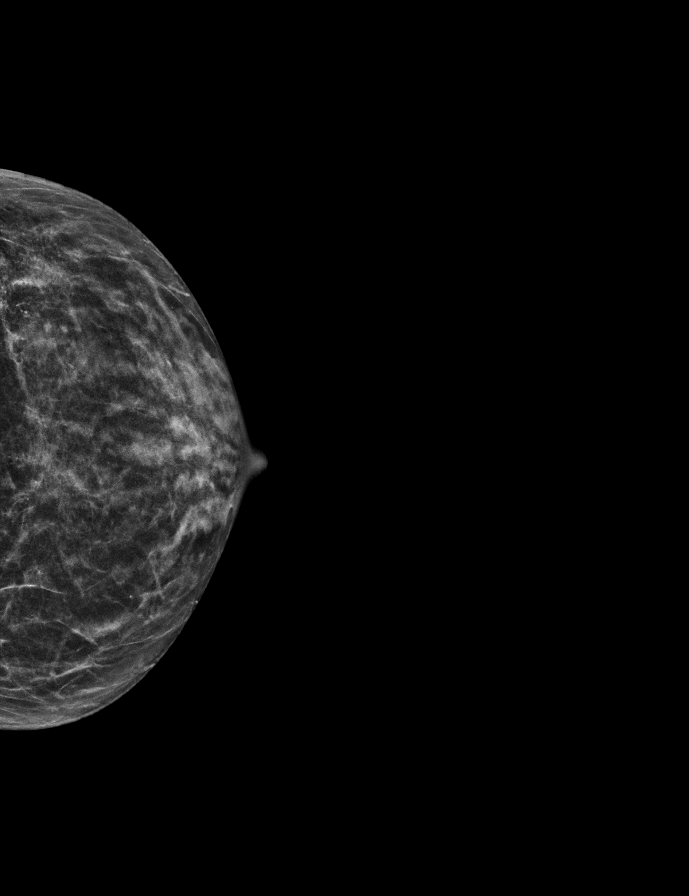

[L MLO synth-2D]
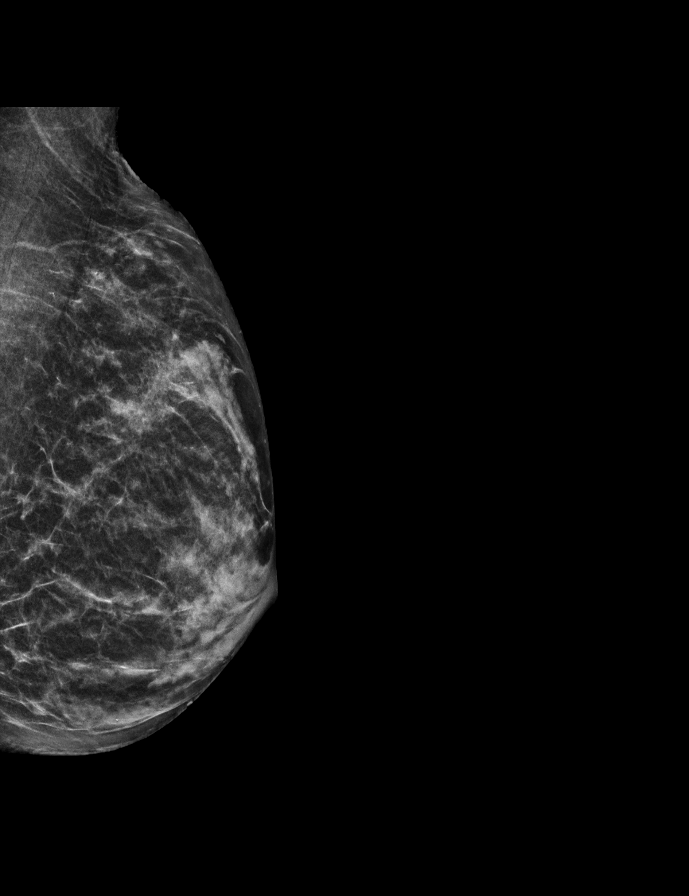

[L MLO tomo · 2 of 43 frames shown]
[frame 14/43]
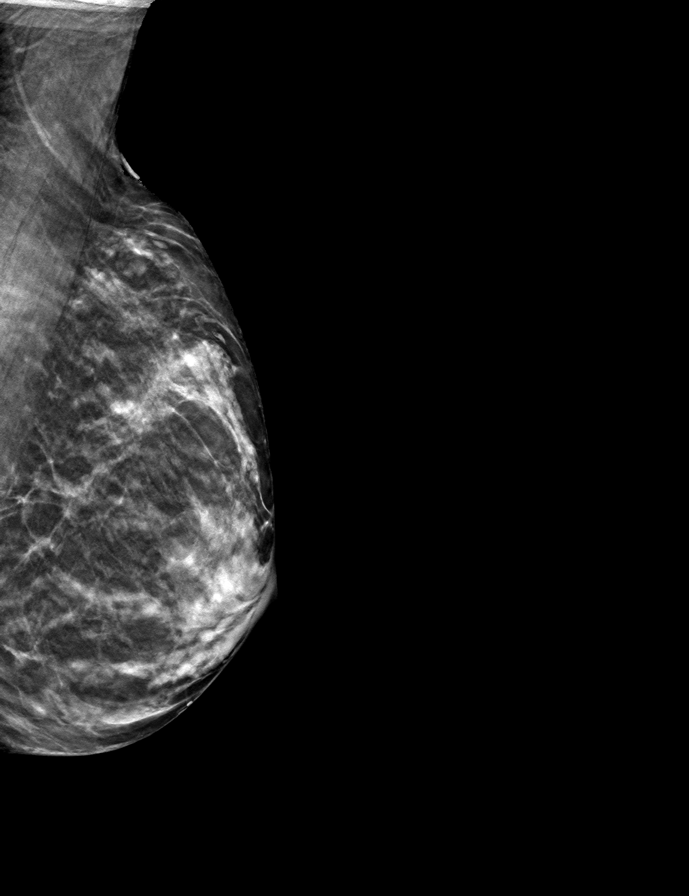
[frame 22/43]
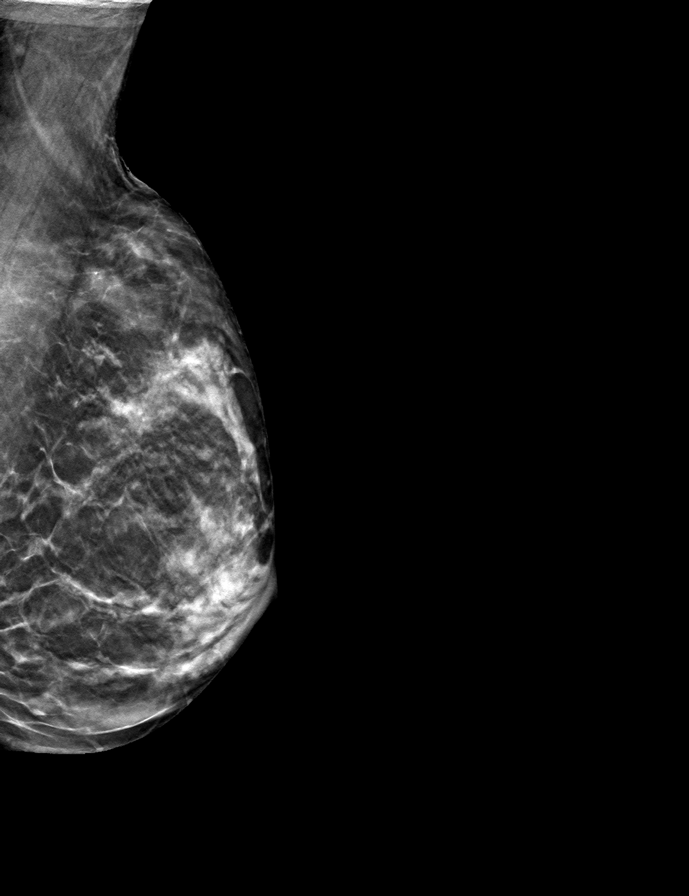

[R MLO tomo · tomo slice 25/48.0]
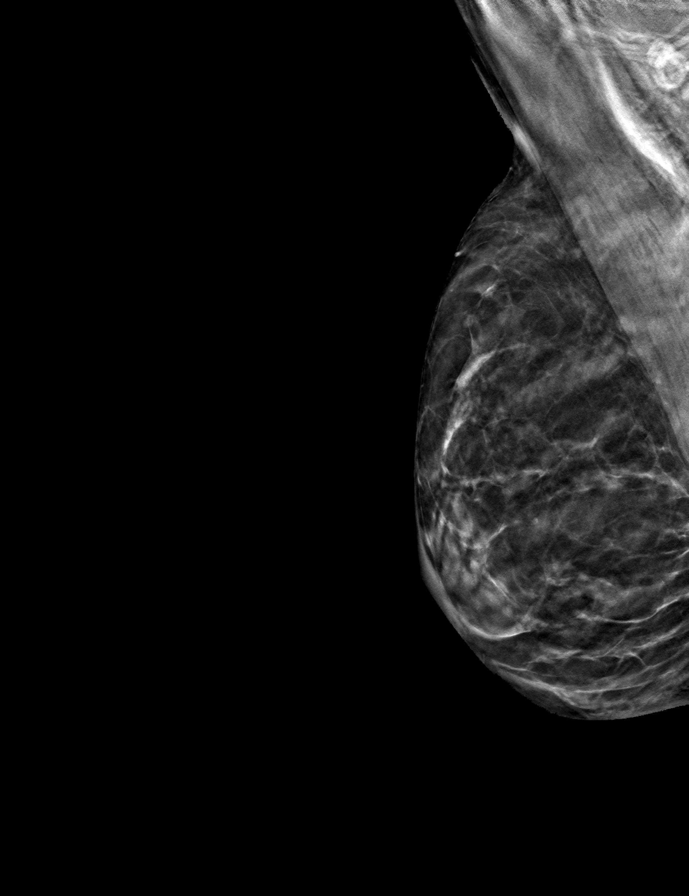

[L CC tomo · tomo slice 21/41.0]
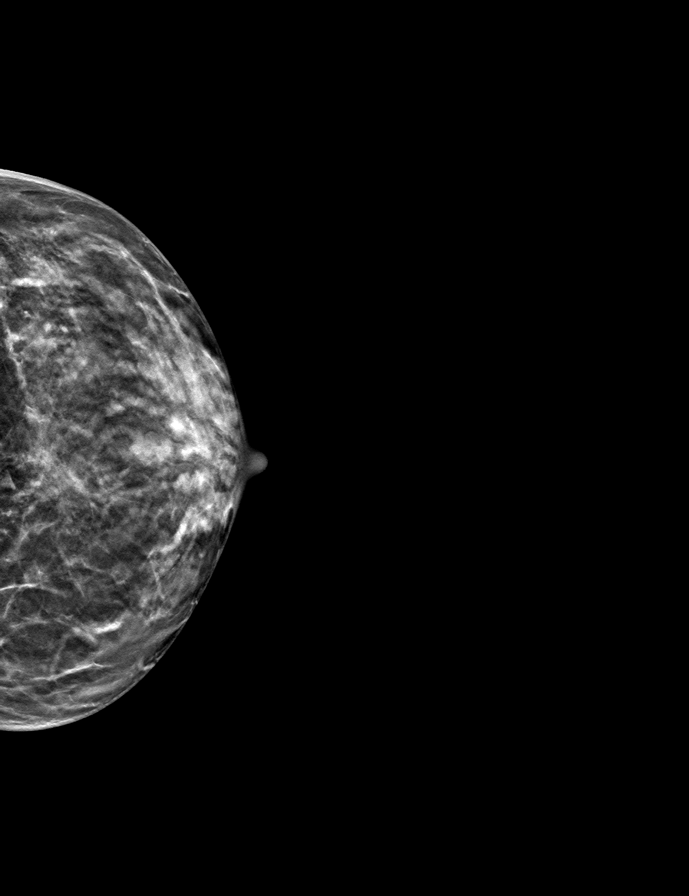

[R CC tomo · tomo slice 23/44.0]
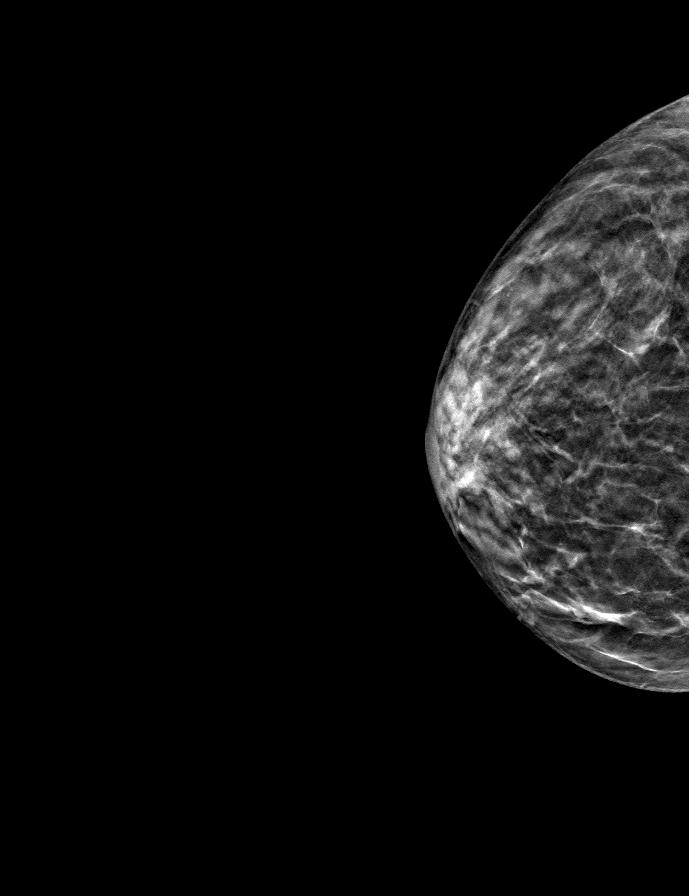

[9 of 24 positions shown; findings below may reference images not displayed]

ACR Breast Density Category b: There are scattered areas of
fibroglandular density.
FINDINGS: There are no findings suspicious for malignancy.
IMPRESSION: No mammographic evidence of malignancy. A result letter of this
screening mammogram will be mailed directly to the patient.

RECOMMENDATION:
Screening mammogram in one year. (Code:[BY])

BI-RADS CATEGORY  1: Negative.

## 2021-01-15 DIAGNOSIS — F418 Other specified anxiety disorders: Secondary | ICD-10-CM | POA: Diagnosis not present

## 2021-01-28 DIAGNOSIS — F418 Other specified anxiety disorders: Secondary | ICD-10-CM | POA: Diagnosis not present

## 2021-02-16 DIAGNOSIS — K200 Eosinophilic esophagitis: Secondary | ICD-10-CM | POA: Diagnosis not present

## 2021-02-16 DIAGNOSIS — Z1211 Encounter for screening for malignant neoplasm of colon: Secondary | ICD-10-CM | POA: Diagnosis not present

## 2021-02-25 DIAGNOSIS — F418 Other specified anxiety disorders: Secondary | ICD-10-CM | POA: Diagnosis not present

## 2021-03-02 DIAGNOSIS — Z1211 Encounter for screening for malignant neoplasm of colon: Secondary | ICD-10-CM | POA: Diagnosis not present

## 2021-03-02 DIAGNOSIS — K200 Eosinophilic esophagitis: Secondary | ICD-10-CM | POA: Diagnosis not present

## 2021-03-18 DIAGNOSIS — F418 Other specified anxiety disorders: Secondary | ICD-10-CM | POA: Diagnosis not present

## 2021-03-26 DIAGNOSIS — F418 Other specified anxiety disorders: Secondary | ICD-10-CM | POA: Diagnosis not present

## 2021-04-07 DIAGNOSIS — H04123 Dry eye syndrome of bilateral lacrimal glands: Secondary | ICD-10-CM | POA: Diagnosis not present

## 2021-04-07 DIAGNOSIS — H2513 Age-related nuclear cataract, bilateral: Secondary | ICD-10-CM | POA: Diagnosis not present

## 2021-04-13 DIAGNOSIS — K2930 Chronic superficial gastritis without bleeding: Secondary | ICD-10-CM | POA: Diagnosis not present

## 2021-04-13 DIAGNOSIS — K2289 Other specified disease of esophagus: Secondary | ICD-10-CM | POA: Diagnosis not present

## 2021-04-13 DIAGNOSIS — K317 Polyp of stomach and duodenum: Secondary | ICD-10-CM | POA: Diagnosis not present

## 2021-04-13 DIAGNOSIS — Z1211 Encounter for screening for malignant neoplasm of colon: Secondary | ICD-10-CM | POA: Diagnosis not present

## 2021-04-13 DIAGNOSIS — K200 Eosinophilic esophagitis: Secondary | ICD-10-CM | POA: Diagnosis not present

## 2021-04-15 DIAGNOSIS — F418 Other specified anxiety disorders: Secondary | ICD-10-CM | POA: Diagnosis not present

## 2021-04-19 DIAGNOSIS — K2930 Chronic superficial gastritis without bleeding: Secondary | ICD-10-CM | POA: Diagnosis not present

## 2021-04-19 DIAGNOSIS — K2289 Other specified disease of esophagus: Secondary | ICD-10-CM | POA: Diagnosis not present

## 2021-05-11 ENCOUNTER — Other Ambulatory Visit: Payer: Self-pay | Admitting: Podiatry

## 2021-05-11 ENCOUNTER — Encounter: Payer: Self-pay | Admitting: Podiatry

## 2021-05-11 ENCOUNTER — Ambulatory Visit: Payer: Medicare PPO | Admitting: Podiatry

## 2021-05-11 ENCOUNTER — Ambulatory Visit (INDEPENDENT_AMBULATORY_CARE_PROVIDER_SITE_OTHER): Payer: Medicare PPO

## 2021-05-11 ENCOUNTER — Other Ambulatory Visit: Payer: Self-pay

## 2021-05-11 DIAGNOSIS — L600 Ingrowing nail: Secondary | ICD-10-CM

## 2021-05-11 DIAGNOSIS — M2011 Hallux valgus (acquired), right foot: Secondary | ICD-10-CM

## 2021-05-11 DIAGNOSIS — M2010 Hallux valgus (acquired), unspecified foot: Secondary | ICD-10-CM

## 2021-05-11 DIAGNOSIS — M2012 Hallux valgus (acquired), left foot: Secondary | ICD-10-CM | POA: Diagnosis not present

## 2021-05-11 DIAGNOSIS — S9032XA Contusion of left foot, initial encounter: Secondary | ICD-10-CM

## 2021-05-11 MED ORDER — NEOMYCIN-POLYMYXIN-HC 1 % OT SOLN: OTIC | 1 refills | Status: DC

## 2021-05-11 NOTE — Progress Notes (Signed)
Subjective:  Patient ID: Megan Cox, female    DOB: September 16, 1951,  MRN: [id number] HPI Chief Complaint  Patient presents with  . Toe Pain    Hallux left - medial border, tender x few weeks, using neosporin-helped  . 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 New Patient (Initial Visit)    70 y.o. female presents with the above complaint.   ROS: She denies fever chills nausea vomiting muscle aches pains calf pain back pain chest pain shortness of breath.  Past Medical History:  Diagnosis Date  . Depression   . Esophagitis   . MSSA bacteremia 03/2020   Past Surgical History:  Procedure Laterality Date  . ESOPHAGOGASTRODUODENOSCOPY  2012  . ESOPHAGOGASTRODUODENOSCOPY N/A 09/22/2013   Procedure: ESOPHAGOGASTRODUODENOSCOPY (EGD);  Surgeon: Lear Ng, MD;  Location: [facility];  Service: Endoscopy;  Laterality: N/A;  . OVARIAN CYST REMOVAL  1973  . TUBAL LIGATION      Current Outpatient Medications:  .  acetaminophen (TYLENOL) 650 MG CR tablet, Take 1,300 mg by mouth every 8 (eight) hours as needed for pain or fever. 1TAB DAILY 500MG , Disp: , Rfl:  .  ALPRAZolam (XANAX) 0.25 MG tablet, Take 0.25 mg by mouth 2 (two) times daily as needed for anxiety or sleep. , Disp: , Rfl:  .  budesonide (PULMICORT) 0.5 MG/2ML nebulizer solution, Take 1 mg by nebulization See admin instructions. Mix 1 mg (2 ampules) into 5 ml's of sugar-free simple syrup and drink as needed for symptoms of Eosinophilic esophagitis, Disp: , Rfl:  .  Calcium Carbonate-Vitamin D 600-400 MG-UNIT chew tablet, Chew 1 tablet by mouth daily., Disp: , Rfl:  .  Calcium Citrate-Vitamin D (CALCIUM CITRATE + D PO), Take 2 tablets by mouth., Disp: , Rfl:  .  Cyanocobalamin (VITAMIN B-12) 2500 MCG SUBL, Place 1,250 mcg under the tongue every 7 (seven) days. , Disp: , Rfl:  .  diphenhydrAMINE (SOMINEX) 25 MG tablet, Take 25 mg by mouth at bedtime as needed  for sleep., Disp: , Rfl:  .  Ferrous Sulfate Dried (HIGH POTENCY IRON) 65 MG TABS, Take 65 mg by mouth daily. , Disp: , Rfl:  .  Ginkgo Biloba 120 MG TABS, Take 120 mg by mouth daily., Disp: , Rfl:  .  Misc Natural Products (GLUCOSAMINE CHONDROITIN ADV PO), Take 2 capsules by mouth in the morning and at bedtime., Disp: , Rfl:  .  Multiple Vitamins-Minerals (DAILY MULTIVITAMIN) CAPS, Take 1 capsule by mouth daily., Disp: , Rfl:  .  NEOMYCIN-POLYMYXIN-HYDROCORTISONE (CORTISPORIN) 1 % SOLN OTIC solution, Apply 1-2 drops to toe BID after soaking, Disp: 10 mL, Rfl: 1 .  pantoprazole (PROTONIX) 40 MG tablet, Take 40 mg by mouth 2 (two) times daily before a meal., Disp: , Rfl:  .  Polyethyl Glycol-Propyl Glycol (SYSTANE HYDRATION PF OP), Place 1 drop into both eyes 3 (three) times daily as needed (for dry eyes, when not using Blink Tears). , Disp: , Rfl:  .  TRAMADOL HCL PO, Take by mouth., Disp: , Rfl:  .  traZODone (DESYREL) 50 MG tablet, Patient takes 50 mg in the am and 100 mg at bedtime (Patient taking differently: Take 50-100 mg by mouth See admin instructions. Take 50 mg by mouth in the morning and 100 mg at bedtime), Disp: , Rfl:  .  TURMERIC PO, Take 2 capsules by mouth daily. , Disp: , Rfl:   Allergies  Allergen Reactions  . Contrast Media [Iodinated Diagnostic Agents] Anaphylaxis, Shortness Of Breath and Swelling    PATIENT STATES THAT HER BROTHER HAD ANAPHYLAXIS WITH IV CONTRAST AND WANTS TO SHARE THIS FAMILY HISTORY. Brother died from reaction   Review of Systems Objective:  There were no vitals filed for this visit.  General: Well developed, nourished, in no acute distress, alert and oriented x3   Dermatological: Skin is warm, dry and supple bilateral. Nails x 10 are well maintained; remaining integument appears unremarkable at this time. There are no open sores, no preulcerative lesions, no rash or signs of infection present.  Sharp innervated nail margin along the tibial border of the  hallux left.  No erythema cellulitis drainage odor  Vascular: Dorsalis Pedis artery and Posterior Tibial artery pedal pulses are 2/4 bilateral with immedate capillary fill time. Pedal hair growth present. No varicosities and no lower extremity edema present bilateral.   Neruologic: Grossly intact via light touch bilateral. Vibratory intact via tuning fork bilateral. Protective threshold with Semmes Wienstein monofilament intact to all pedal sites bilateral. Patellar and Achilles deep tendon reflexes 2+ bilateral. No Babinski or clonus noted bilateral.   Musculoskeletal: No gross boney pedal deformities bilateral. No pain, crepitus, or limitation noted with foot and ankle range of motion bilateral. Muscular strength 5/5 in all groups tested bilateral.  Hallux interphalangeal bilaterally with mild hallux limitus first metatarsal phalangeal joint of the right foot and mild hallux valgus left foot.  Gait: Unassisted, Nonantalgic.    Radiographs:  Osteoarthritic changes with joint space narrowing first metatarsophalangeal joint of the right foot with hallux interphalangeal right foot.  Left foot demonstrates same hallux interphalangeal and an increase in the first and metatarsal angle greater than normal value with hallux abductus angle greater than normal value.  Assessment & Plan:   Assessment: Ingrown toenail tibial border hallux left.  Mild hallux valgus left and hallux interphalangeal bilaterally and hallux limitus right.  Plan: Discussed etiology pathology conservative surgical therapies at this point time went ahead and performed a chemical matricectomy to the tibial border of the hallux left she tolerated procedure well after local anesthetic was administered.  She is provided with both oral and written home-going instruction for the care and soaking of the toe.  Also discussed in great detail today surgical interventions for the first metatarsophalangeal joints bilaterally.  Follow-up with her  in 2 weeks     Tessi Eustache T. [place], [place]

## 2021-05-11 NOTE — Patient Instructions (Signed)

## 2021-05-27 ENCOUNTER — Ambulatory Visit: Payer: Medicare PPO | Admitting: Podiatry

## 2021-05-27 ENCOUNTER — Other Ambulatory Visit: Payer: Self-pay

## 2021-05-27 DIAGNOSIS — G2581 Restless legs syndrome: Secondary | ICD-10-CM | POA: Insufficient documentation

## 2021-05-27 DIAGNOSIS — M179 Osteoarthritis of knee, unspecified: Secondary | ICD-10-CM | POA: Insufficient documentation

## 2021-05-27 DIAGNOSIS — M4622 Osteomyelitis of vertebra, cervical region: Secondary | ICD-10-CM | POA: Insufficient documentation

## 2021-05-27 DIAGNOSIS — F419 Anxiety disorder, unspecified: Secondary | ICD-10-CM | POA: Insufficient documentation

## 2021-05-27 DIAGNOSIS — F509 Eating disorder, unspecified: Secondary | ICD-10-CM | POA: Insufficient documentation

## 2021-05-27 DIAGNOSIS — D131 Benign neoplasm of stomach: Secondary | ICD-10-CM | POA: Insufficient documentation

## 2021-05-27 DIAGNOSIS — G5600 Carpal tunnel syndrome, unspecified upper limb: Secondary | ICD-10-CM | POA: Insufficient documentation

## 2021-05-27 DIAGNOSIS — H04123 Dry eye syndrome of bilateral lacrimal glands: Secondary | ICD-10-CM | POA: Insufficient documentation

## 2021-05-27 DIAGNOSIS — J3089 Other allergic rhinitis: Secondary | ICD-10-CM | POA: Insufficient documentation

## 2021-05-27 DIAGNOSIS — M8580 Other specified disorders of bone density and structure, unspecified site: Secondary | ICD-10-CM | POA: Insufficient documentation

## 2021-05-27 DIAGNOSIS — L600 Ingrowing nail: Secondary | ICD-10-CM

## 2021-05-27 DIAGNOSIS — K2930 Chronic superficial gastritis without bleeding: Secondary | ICD-10-CM | POA: Insufficient documentation

## 2021-05-27 DIAGNOSIS — M7542 Impingement syndrome of left shoulder: Secondary | ICD-10-CM | POA: Insufficient documentation

## 2021-05-27 DIAGNOSIS — K219 Gastro-esophageal reflux disease without esophagitis: Secondary | ICD-10-CM | POA: Insufficient documentation

## 2021-05-27 DIAGNOSIS — F5101 Primary insomnia: Secondary | ICD-10-CM | POA: Insufficient documentation

## 2021-05-27 DIAGNOSIS — R0989 Other specified symptoms and signs involving the circulatory and respiratory systems: Secondary | ICD-10-CM | POA: Insufficient documentation

## 2021-05-27 DIAGNOSIS — O162 Unspecified maternal hypertension, second trimester: Secondary | ICD-10-CM | POA: Insufficient documentation

## 2021-05-27 DIAGNOSIS — K623 Rectal prolapse: Secondary | ICD-10-CM | POA: Insufficient documentation

## 2021-05-27 DIAGNOSIS — Z9889 Other specified postprocedural states: Secondary | ICD-10-CM

## 2021-05-27 DIAGNOSIS — E7800 Pure hypercholesterolemia, unspecified: Secondary | ICD-10-CM | POA: Insufficient documentation

## 2021-05-27 DIAGNOSIS — M5136 Other intervertebral disc degeneration, lumbar region: Secondary | ICD-10-CM | POA: Insufficient documentation

## 2021-05-27 DIAGNOSIS — M4642 Discitis, unspecified, cervical region: Secondary | ICD-10-CM | POA: Insufficient documentation

## 2021-05-27 DIAGNOSIS — R198 Other specified symptoms and signs involving the digestive system and abdomen: Secondary | ICD-10-CM | POA: Insufficient documentation

## 2021-05-27 DIAGNOSIS — F325 Major depressive disorder, single episode, in full remission: Secondary | ICD-10-CM | POA: Insufficient documentation

## 2021-05-27 NOTE — Progress Notes (Signed)
Presents today for follow-up of her matrixectomy hallux left.  States that she really does not have any Concerns.'s been soaking in Betadine and warm water.  Objective: Vital signs are stable she is alert and oriented x3 tibial border of the hallux left demonstrates well-healing matrixectomy see no signs of infection no purulence no malodor.  Assessment: Well-healing surgical foot.  Plan: Continue to soak every other day Epson salts and warm water or Betadine and warm water cover during the day leave open at bedtime.  Make sure this this is completely resolved in 2 weeks if not then let me know.

## 2021-05-28 ENCOUNTER — Encounter: Payer: Self-pay | Admitting: Podiatry

## 2021-06-03 DIAGNOSIS — F418 Other specified anxiety disorders: Secondary | ICD-10-CM | POA: Diagnosis not present

## 2021-06-24 DIAGNOSIS — H6123 Impacted cerumen, bilateral: Secondary | ICD-10-CM | POA: Diagnosis not present

## 2021-06-24 DIAGNOSIS — Z872 Personal history of diseases of the skin and subcutaneous tissue: Secondary | ICD-10-CM | POA: Diagnosis not present

## 2021-06-24 DIAGNOSIS — L821 Other seborrheic keratosis: Secondary | ICD-10-CM | POA: Diagnosis not present

## 2021-06-24 DIAGNOSIS — H902 Conductive hearing loss, unspecified: Secondary | ICD-10-CM | POA: Diagnosis not present

## 2021-06-24 DIAGNOSIS — D1801 Hemangioma of skin and subcutaneous tissue: Secondary | ICD-10-CM | POA: Diagnosis not present

## 2021-06-24 DIAGNOSIS — F418 Other specified anxiety disorders: Secondary | ICD-10-CM | POA: Diagnosis not present

## 2021-06-24 DIAGNOSIS — L814 Other melanin hyperpigmentation: Secondary | ICD-10-CM | POA: Diagnosis not present

## 2021-06-24 DIAGNOSIS — L905 Scar conditions and fibrosis of skin: Secondary | ICD-10-CM | POA: Diagnosis not present

## 2021-07-08 DIAGNOSIS — F418 Other specified anxiety disorders: Secondary | ICD-10-CM | POA: Diagnosis not present

## 2021-07-20 DIAGNOSIS — F3341 Major depressive disorder, recurrent, in partial remission: Secondary | ICD-10-CM | POA: Diagnosis not present

## 2021-07-20 DIAGNOSIS — Z0000 Encounter for general adult medical examination without abnormal findings: Secondary | ICD-10-CM | POA: Diagnosis not present

## 2021-07-20 DIAGNOSIS — K219 Gastro-esophageal reflux disease without esophagitis: Secondary | ICD-10-CM | POA: Diagnosis not present

## 2021-07-20 DIAGNOSIS — E7800 Pure hypercholesterolemia, unspecified: Secondary | ICD-10-CM | POA: Diagnosis not present

## 2021-07-20 DIAGNOSIS — D649 Anemia, unspecified: Secondary | ICD-10-CM | POA: Diagnosis not present

## 2021-07-20 DIAGNOSIS — K200 Eosinophilic esophagitis: Secondary | ICD-10-CM | POA: Diagnosis not present

## 2021-07-20 DIAGNOSIS — M8580 Other specified disorders of bone density and structure, unspecified site: Secondary | ICD-10-CM | POA: Diagnosis not present

## 2021-07-20 DIAGNOSIS — F5101 Primary insomnia: Secondary | ICD-10-CM | POA: Diagnosis not present

## 2021-07-22 ENCOUNTER — Other Ambulatory Visit: Payer: Self-pay | Admitting: Internal Medicine

## 2021-07-22 DIAGNOSIS — M8580 Other specified disorders of bone density and structure, unspecified site: Secondary | ICD-10-CM

## 2021-07-26 DIAGNOSIS — F418 Other specified anxiety disorders: Secondary | ICD-10-CM | POA: Diagnosis not present

## 2021-08-05 ENCOUNTER — Ambulatory Visit
Admission: RE | Admit: 2021-08-05 | Discharge: 2021-08-05 | Disposition: A | Discharge status: Home / Self Care | Payer: Medicare PPO | Source: Ambulatory Visit | Attending: Internal Medicine | Admitting: Internal Medicine

## 2021-08-05 ENCOUNTER — Other Ambulatory Visit: Payer: Self-pay

## 2021-08-05 DIAGNOSIS — M810 Age-related osteoporosis without current pathological fracture: Secondary | ICD-10-CM | POA: Diagnosis not present

## 2021-08-05 DIAGNOSIS — M8580 Other specified disorders of bone density and structure, unspecified site: Secondary | ICD-10-CM

## 2021-08-05 DIAGNOSIS — Z780 Asymptomatic menopausal state: Secondary | ICD-10-CM | POA: Diagnosis not present

## 2021-08-05 DIAGNOSIS — M85832 Other specified disorders of bone density and structure, left forearm: Secondary | ICD-10-CM | POA: Diagnosis not present

## 2021-08-12 DIAGNOSIS — F418 Other specified anxiety disorders: Secondary | ICD-10-CM | POA: Diagnosis not present

## 2021-08-26 DIAGNOSIS — M810 Age-related osteoporosis without current pathological fracture: Secondary | ICD-10-CM | POA: Diagnosis not present

## 2021-08-26 DIAGNOSIS — E7800 Pure hypercholesterolemia, unspecified: Secondary | ICD-10-CM | POA: Diagnosis not present

## 2021-09-09 DIAGNOSIS — F418 Other specified anxiety disorders: Secondary | ICD-10-CM | POA: Diagnosis not present

## 2021-09-30 DIAGNOSIS — F418 Other specified anxiety disorders: Secondary | ICD-10-CM | POA: Diagnosis not present

## 2021-10-15 DIAGNOSIS — E7800 Pure hypercholesterolemia, unspecified: Secondary | ICD-10-CM | POA: Diagnosis not present

## 2021-10-22 DIAGNOSIS — F418 Other specified anxiety disorders: Secondary | ICD-10-CM | POA: Diagnosis not present

## 2021-11-05 DIAGNOSIS — F418 Other specified anxiety disorders: Secondary | ICD-10-CM | POA: Diagnosis not present

## 2021-11-10 DIAGNOSIS — F418 Other specified anxiety disorders: Secondary | ICD-10-CM | POA: Diagnosis not present

## 2021-11-23 DIAGNOSIS — R6884 Jaw pain: Secondary | ICD-10-CM | POA: Diagnosis not present

## 2021-11-23 DIAGNOSIS — F418 Other specified anxiety disorders: Secondary | ICD-10-CM | POA: Diagnosis not present

## 2021-11-23 DIAGNOSIS — M810 Age-related osteoporosis without current pathological fracture: Secondary | ICD-10-CM | POA: Diagnosis not present

## 2021-11-25 DIAGNOSIS — F418 Other specified anxiety disorders: Secondary | ICD-10-CM | POA: Diagnosis not present

## 2021-12-09 DIAGNOSIS — F418 Other specified anxiety disorders: Secondary | ICD-10-CM | POA: Diagnosis not present

## 2021-12-17 ENCOUNTER — Other Ambulatory Visit: Payer: Self-pay | Admitting: Internal Medicine

## 2021-12-17 ENCOUNTER — Ambulatory Visit
Admission: RE | Admit: 2021-12-17 | Discharge: 2021-12-17 | Disposition: A | Discharge status: Home / Self Care | Payer: Medicare PPO | Source: Ambulatory Visit | Attending: Internal Medicine | Admitting: Internal Medicine

## 2021-12-17 DIAGNOSIS — R6884 Jaw pain: Secondary | ICD-10-CM

## 2021-12-17 DIAGNOSIS — M810 Age-related osteoporosis without current pathological fracture: Secondary | ICD-10-CM | POA: Diagnosis not present

## 2021-12-17 IMAGING — DX DG MANDIBLE 1-3V
5 series · 5 of 5 positions shown · non-contrast
Comparison: None.

CLINICAL DATA: Left-sided jaw pain.

EXAM:
MANDIBLE - 1-3 VIEW

[dg mandible 1-3 views (1 of 5)]
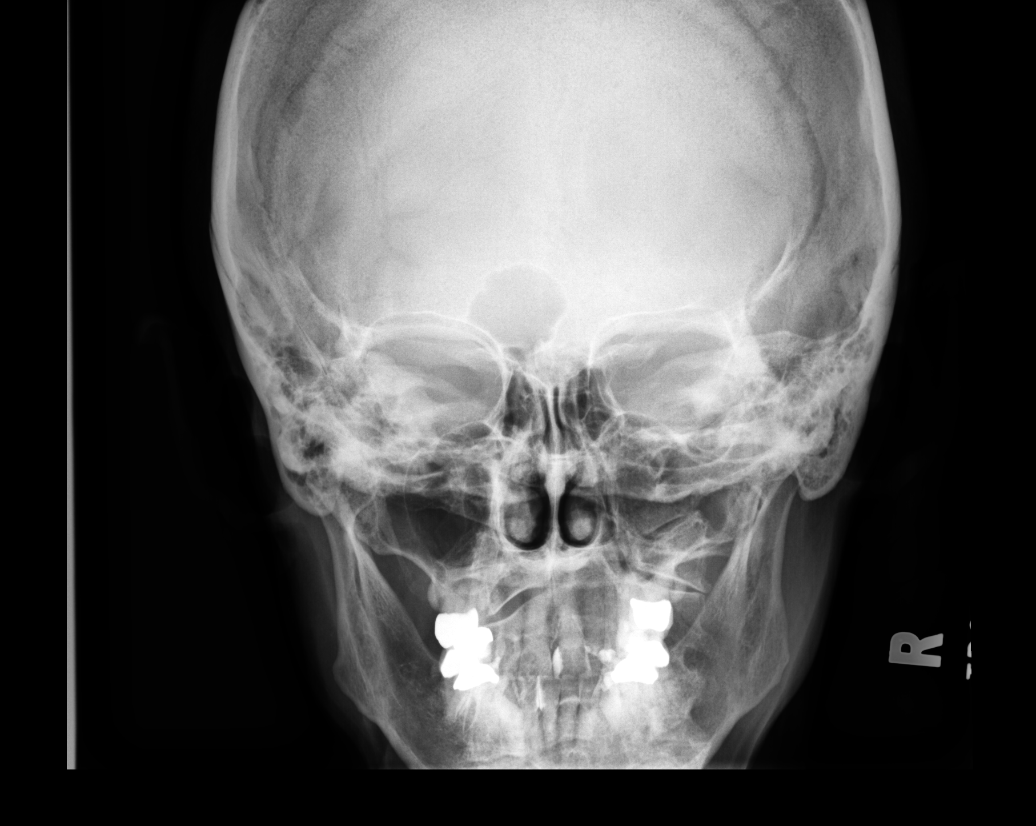

[dg mandible 1-3 views (2 of 5)]
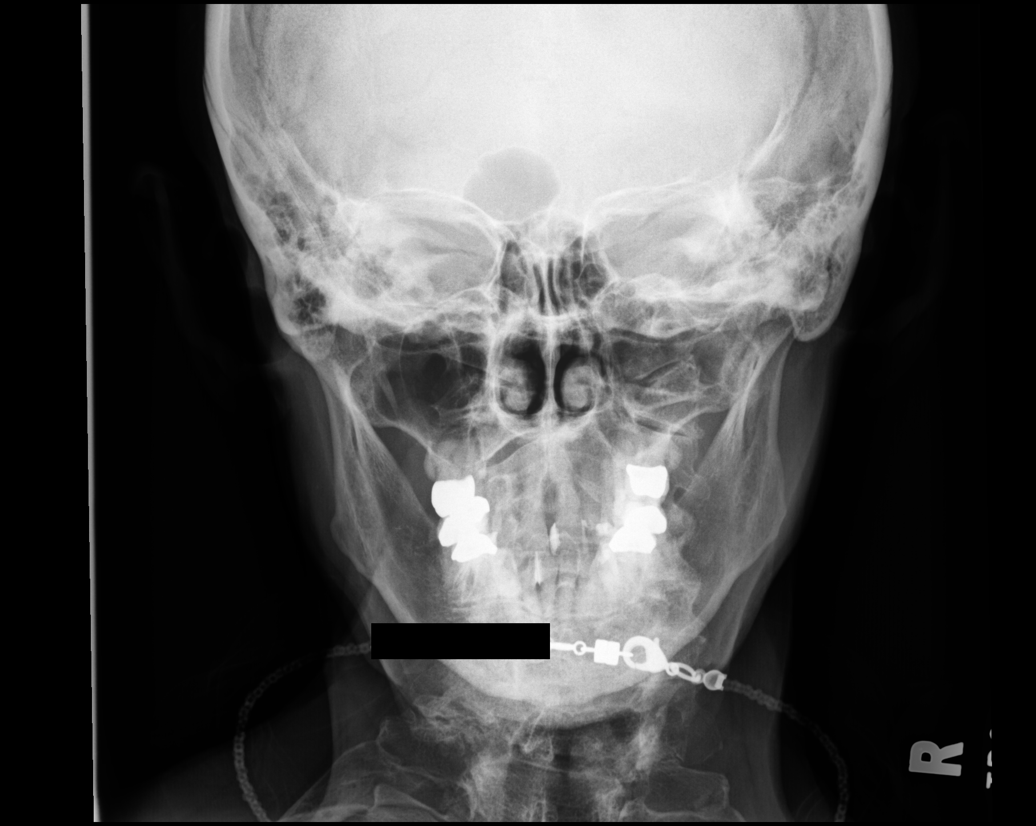

[dg mandible 1-3 views (3 of 5)]
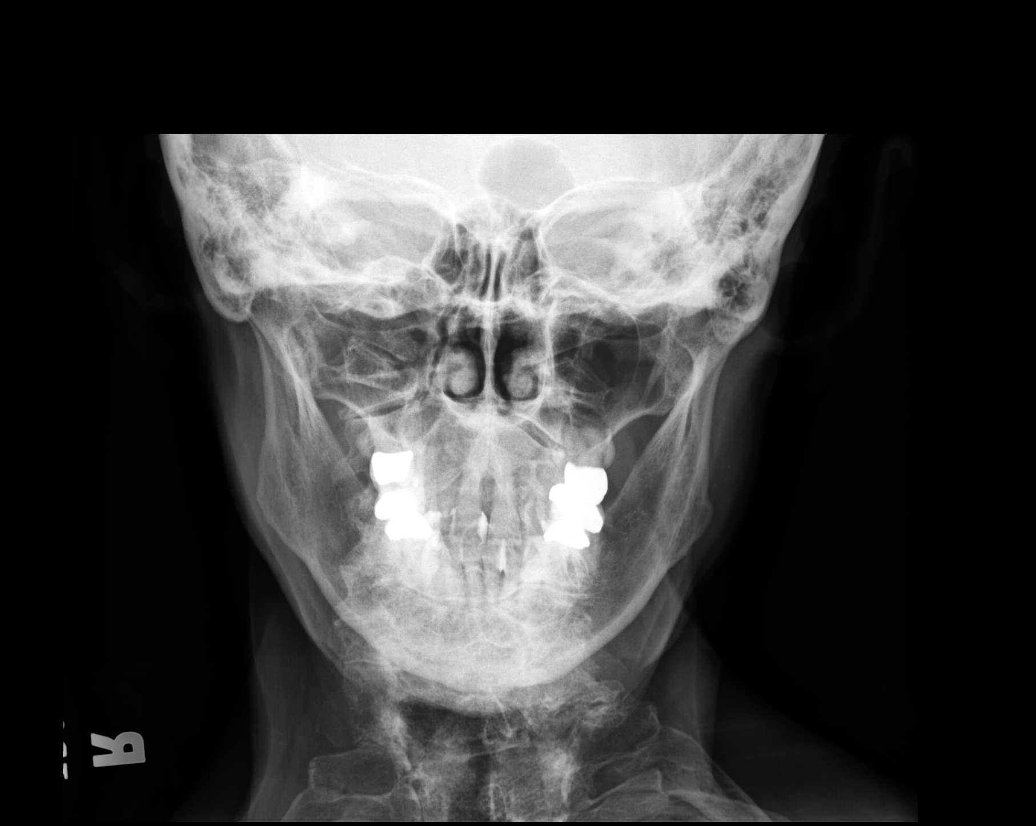

[dg mandible 1-3 views (4 of 5)]
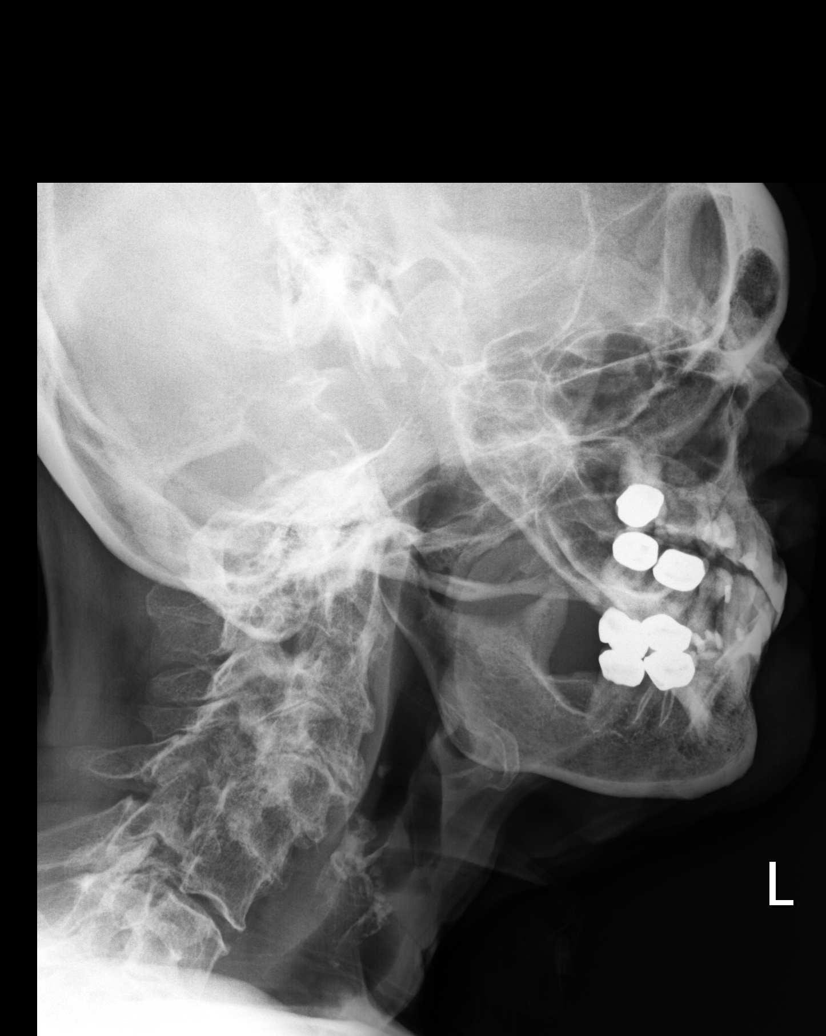

[dg mandible 1-3 views (5 of 5)]
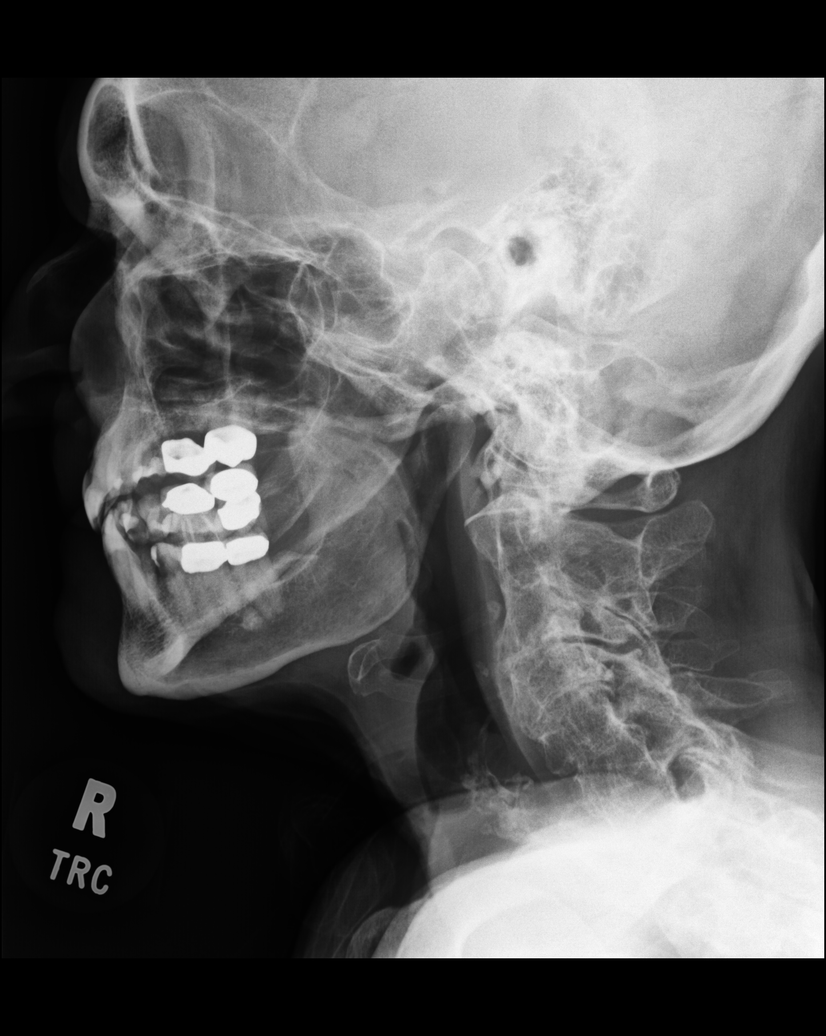

[5 of 5 positions shown; findings below may reference images not displayed]

FINDINGS: There is no evidence of fracture or other focal bone lesions. Marked
severity multilevel degenerative changes are seen throughout the
visualized portion of the cervical spine.
IMPRESSION: No acute osseous abnormality.

## 2021-12-23 DIAGNOSIS — H900 Conductive hearing loss, bilateral: Secondary | ICD-10-CM | POA: Diagnosis not present

## 2021-12-23 DIAGNOSIS — F418 Other specified anxiety disorders: Secondary | ICD-10-CM | POA: Diagnosis not present

## 2021-12-23 DIAGNOSIS — H6123 Impacted cerumen, bilateral: Secondary | ICD-10-CM | POA: Diagnosis not present

## 2022-01-10 DIAGNOSIS — M519 Unspecified thoracic, thoracolumbar and lumbosacral intervertebral disc disorder: Secondary | ICD-10-CM | POA: Diagnosis not present

## 2022-01-10 DIAGNOSIS — M542 Cervicalgia: Secondary | ICD-10-CM | POA: Diagnosis not present

## 2022-01-13 DIAGNOSIS — F418 Other specified anxiety disorders: Secondary | ICD-10-CM | POA: Diagnosis not present

## 2022-01-27 DIAGNOSIS — F418 Other specified anxiety disorders: Secondary | ICD-10-CM | POA: Diagnosis not present

## 2022-02-11 DIAGNOSIS — F418 Other specified anxiety disorders: Secondary | ICD-10-CM | POA: Diagnosis not present

## 2022-02-14 ENCOUNTER — Other Ambulatory Visit: Payer: Self-pay | Admitting: Obstetrics and Gynecology

## 2022-02-14 DIAGNOSIS — Z1231 Encounter for screening mammogram for malignant neoplasm of breast: Secondary | ICD-10-CM

## 2022-02-17 DIAGNOSIS — M810 Age-related osteoporosis without current pathological fracture: Secondary | ICD-10-CM | POA: Diagnosis not present

## 2022-02-22 ENCOUNTER — Ambulatory Visit
Admission: RE | Admit: 2022-02-22 | Discharge: 2022-02-22 | Disposition: A | Discharge status: Home / Self Care | Payer: Medicare PPO | Source: Ambulatory Visit | Attending: Obstetrics and Gynecology | Admitting: Obstetrics and Gynecology

## 2022-02-22 DIAGNOSIS — Z1231 Encounter for screening mammogram for malignant neoplasm of breast: Secondary | ICD-10-CM | POA: Diagnosis not present

## 2022-02-22 IMAGING — MG MM DIGITAL SCREENING BILAT W/ TOMO AND CAD
8 series · 9 of 24 positions shown · non-contrast
Comparison: Previous exam(s).

CLINICAL DATA: Screening.

EXAM:
DIGITAL SCREENING BILATERAL MAMMOGRAM WITH TOMOSYNTHESIS AND CAD
TECHNIQUE: Bilateral screening digital craniocaudal and mediolateral oblique
mammograms were obtained. Bilateral screening digital breast
tomosynthesis was performed. The images were evaluated with
computer-aided detection.

[R MLO synth-2D]
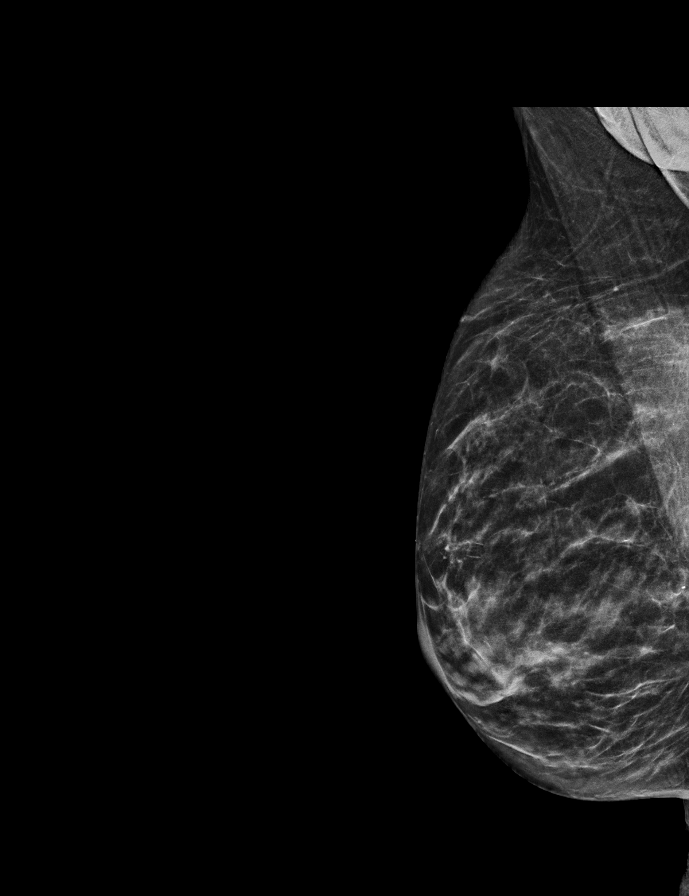

[L MLO synth-2D]
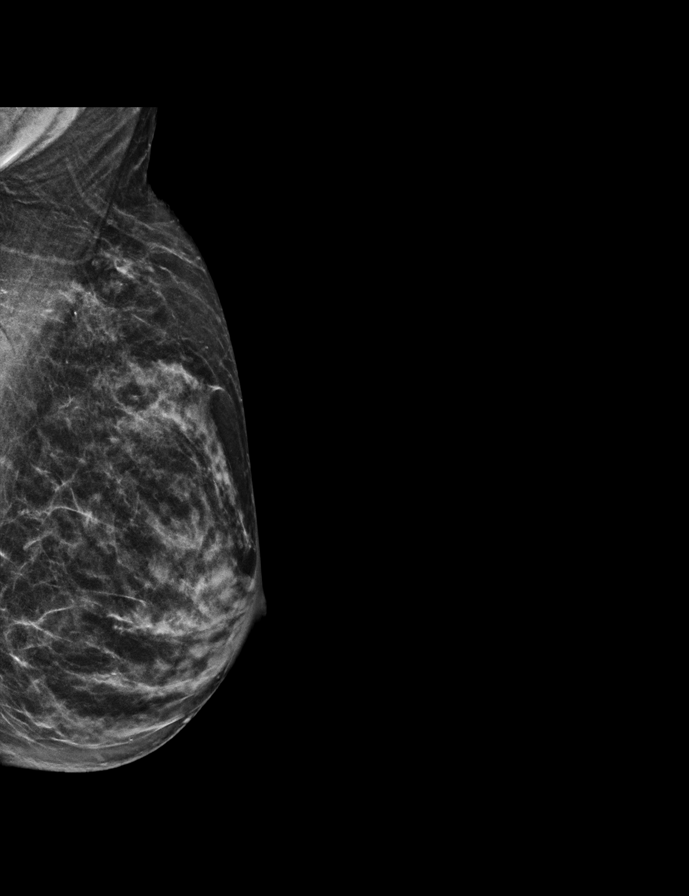

[L CC synth-2D]
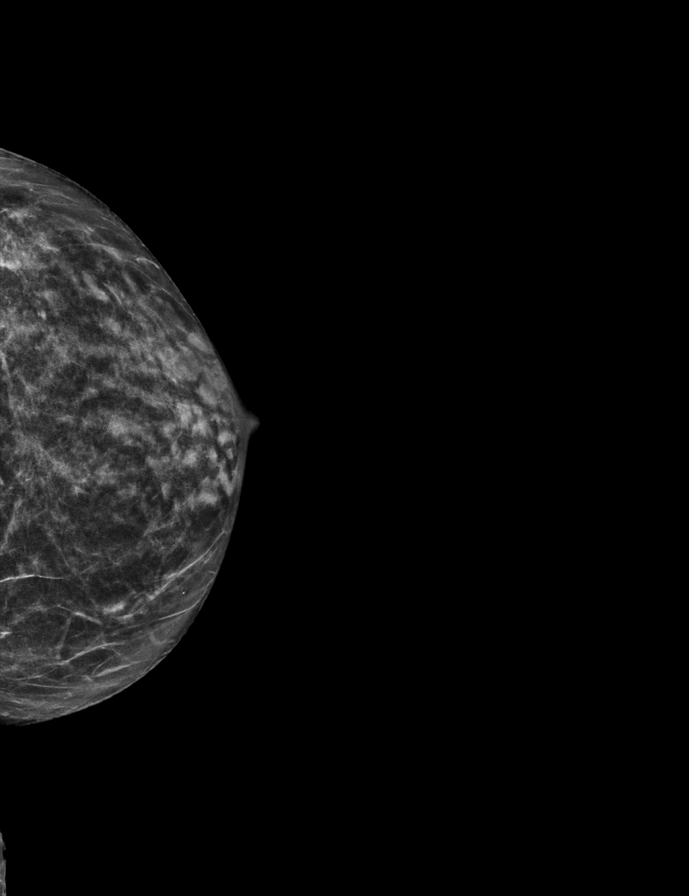

[R CC synth-2D]
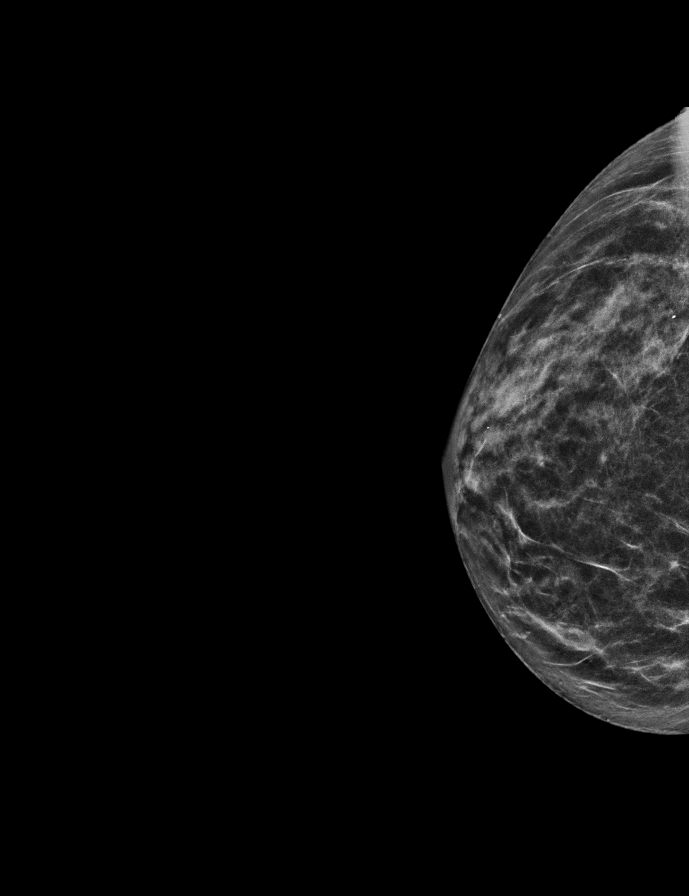

[L CC tomo · 2 of 45 frames shown]
[frame 15/45]
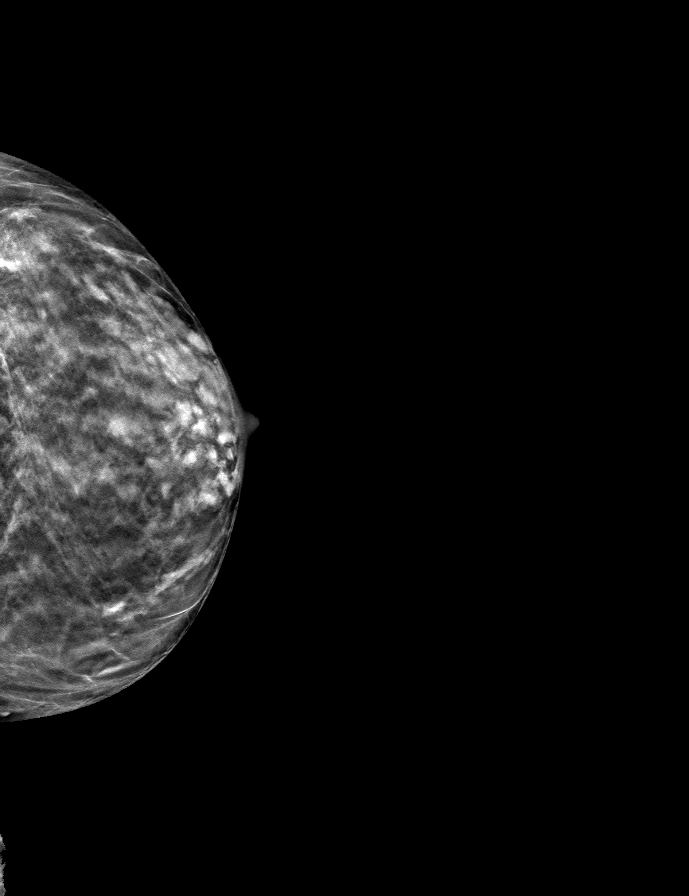
[frame 23/45]
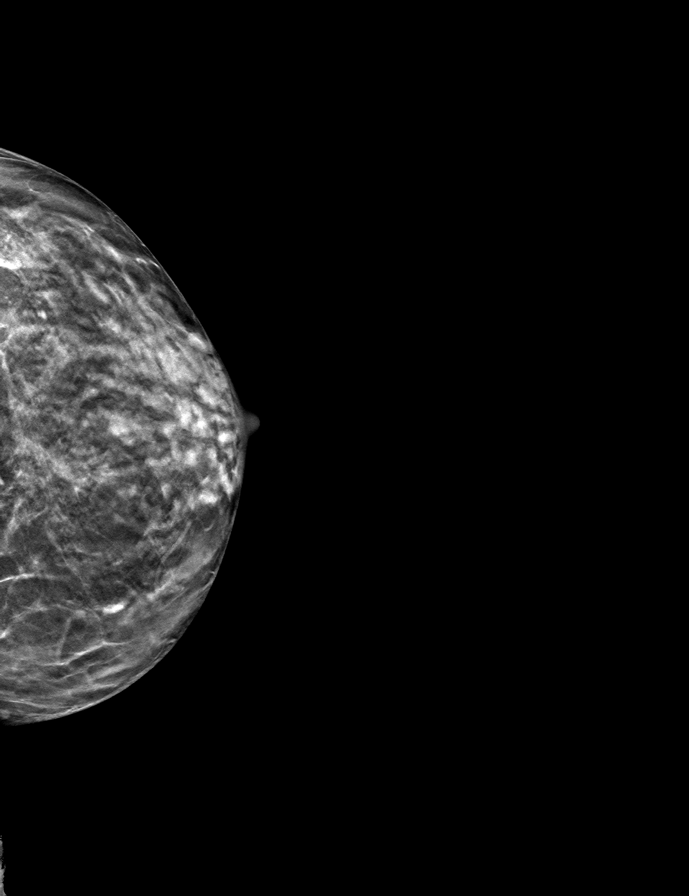

[L MLO tomo · tomo slice 24/47.0]
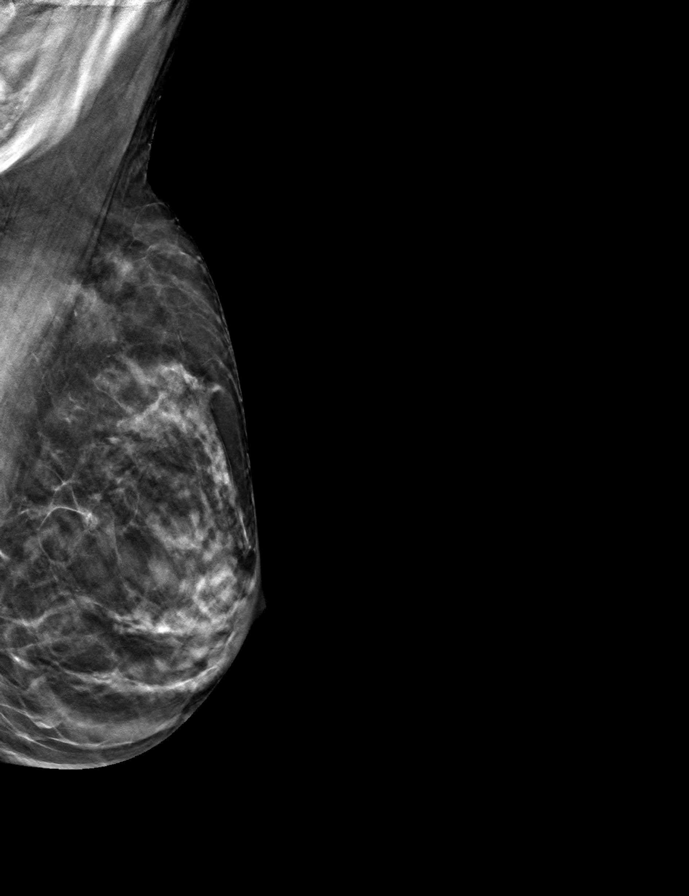

[R CC tomo · tomo slice 24/47.0]
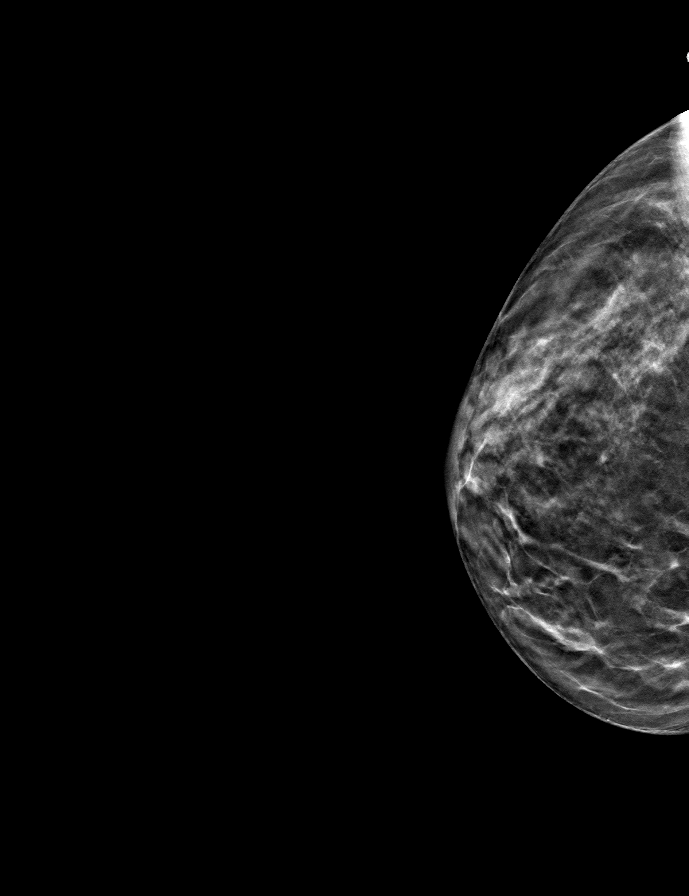

[R MLO tomo · tomo slice 25/48.0]
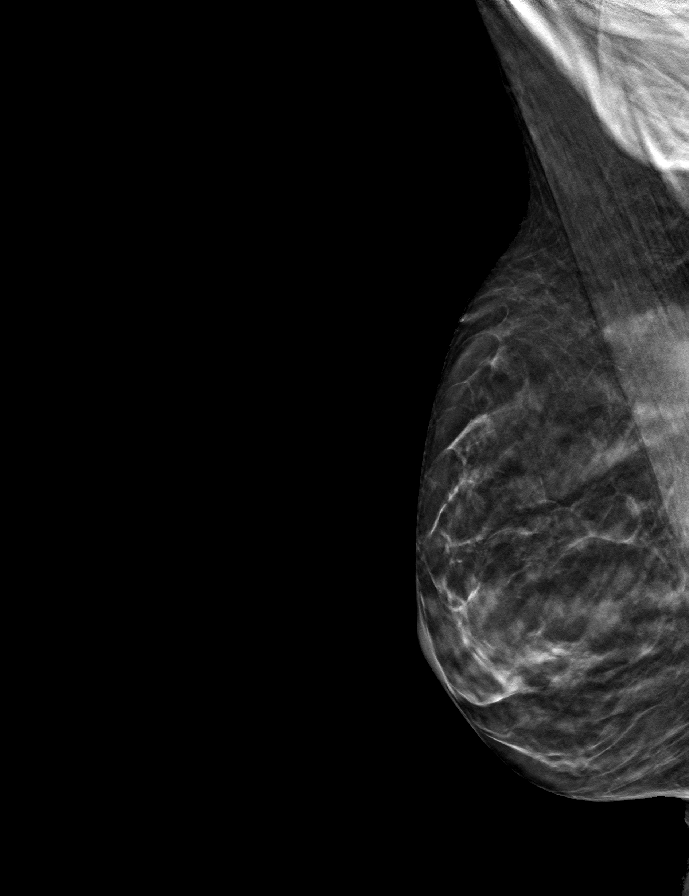

[9 of 24 positions shown; findings below may reference images not displayed]

ACR Breast Density Category b: There are scattered areas of
fibroglandular density.
FINDINGS: There are no findings suspicious for malignancy.
IMPRESSION: No mammographic evidence of malignancy. A result letter of this
screening mammogram will be mailed directly to the patient.

RECOMMENDATION:
Screening mammogram in one year. (Code:[BY])

BI-RADS CATEGORY  1: Negative.

## 2022-03-10 DIAGNOSIS — F418 Other specified anxiety disorders: Secondary | ICD-10-CM | POA: Diagnosis not present

## 2022-04-07 DIAGNOSIS — F418 Other specified anxiety disorders: Secondary | ICD-10-CM | POA: Diagnosis not present

## 2022-04-22 DIAGNOSIS — F418 Other specified anxiety disorders: Secondary | ICD-10-CM | POA: Diagnosis not present

## 2022-04-28 DIAGNOSIS — F418 Other specified anxiety disorders: Secondary | ICD-10-CM | POA: Diagnosis not present

## 2022-05-04 DIAGNOSIS — Z01419 Encounter for gynecological examination (general) (routine) without abnormal findings: Secondary | ICD-10-CM | POA: Diagnosis not present

## 2022-05-09 DIAGNOSIS — F418 Other specified anxiety disorders: Secondary | ICD-10-CM | POA: Diagnosis not present

## 2022-05-18 ENCOUNTER — Encounter: Payer: Self-pay | Admitting: Podiatry

## 2022-05-19 NOTE — Telephone Encounter (Signed)
Please advise 

## 2022-06-14 DIAGNOSIS — H2513 Age-related nuclear cataract, bilateral: Secondary | ICD-10-CM | POA: Diagnosis not present

## 2022-06-14 DIAGNOSIS — H16223 Keratoconjunctivitis sicca, not specified as Sjogren's, bilateral: Secondary | ICD-10-CM | POA: Diagnosis not present

## 2022-06-30 DIAGNOSIS — H6123 Impacted cerumen, bilateral: Secondary | ICD-10-CM | POA: Diagnosis not present

## 2022-06-30 DIAGNOSIS — H93291 Other abnormal auditory perceptions, right ear: Secondary | ICD-10-CM | POA: Diagnosis not present

## 2022-06-30 DIAGNOSIS — H902 Conductive hearing loss, unspecified: Secondary | ICD-10-CM | POA: Diagnosis not present

## 2022-07-18 DIAGNOSIS — F418 Other specified anxiety disorders: Secondary | ICD-10-CM | POA: Diagnosis not present

## 2022-07-29 DIAGNOSIS — F418 Other specified anxiety disorders: Secondary | ICD-10-CM | POA: Diagnosis not present

## 2022-08-10 DIAGNOSIS — F5101 Primary insomnia: Secondary | ICD-10-CM | POA: Diagnosis not present

## 2022-08-10 DIAGNOSIS — K219 Gastro-esophageal reflux disease without esophagitis: Secondary | ICD-10-CM | POA: Diagnosis not present

## 2022-08-10 DIAGNOSIS — Z0000 Encounter for general adult medical examination without abnormal findings: Secondary | ICD-10-CM | POA: Diagnosis not present

## 2022-08-10 DIAGNOSIS — M810 Age-related osteoporosis without current pathological fracture: Secondary | ICD-10-CM | POA: Diagnosis not present

## 2022-08-10 DIAGNOSIS — E7800 Pure hypercholesterolemia, unspecified: Secondary | ICD-10-CM | POA: Diagnosis not present

## 2022-08-10 DIAGNOSIS — F3341 Major depressive disorder, recurrent, in partial remission: Secondary | ICD-10-CM | POA: Diagnosis not present

## 2022-08-10 DIAGNOSIS — K200 Eosinophilic esophagitis: Secondary | ICD-10-CM | POA: Diagnosis not present

## 2022-08-10 DIAGNOSIS — F322 Major depressive disorder, single episode, severe without psychotic features: Secondary | ICD-10-CM | POA: Diagnosis not present

## 2022-08-12 DIAGNOSIS — F418 Other specified anxiety disorders: Secondary | ICD-10-CM | POA: Diagnosis not present

## 2022-08-22 DIAGNOSIS — F418 Other specified anxiety disorders: Secondary | ICD-10-CM | POA: Diagnosis not present

## 2022-08-25 DIAGNOSIS — L821 Other seborrheic keratosis: Secondary | ICD-10-CM | POA: Diagnosis not present

## 2022-08-25 DIAGNOSIS — Z872 Personal history of diseases of the skin and subcutaneous tissue: Secondary | ICD-10-CM | POA: Diagnosis not present

## 2022-08-25 DIAGNOSIS — D225 Melanocytic nevi of trunk: Secondary | ICD-10-CM | POA: Diagnosis not present

## 2022-08-25 DIAGNOSIS — L820 Inflamed seborrheic keratosis: Secondary | ICD-10-CM | POA: Diagnosis not present

## 2022-08-25 DIAGNOSIS — L538 Other specified erythematous conditions: Secondary | ICD-10-CM | POA: Diagnosis not present

## 2022-08-25 DIAGNOSIS — L814 Other melanin hyperpigmentation: Secondary | ICD-10-CM | POA: Diagnosis not present

## 2022-08-25 DIAGNOSIS — L570 Actinic keratosis: Secondary | ICD-10-CM | POA: Diagnosis not present

## 2022-09-16 DIAGNOSIS — F418 Other specified anxiety disorders: Secondary | ICD-10-CM | POA: Diagnosis not present

## 2022-09-23 DIAGNOSIS — F418 Other specified anxiety disorders: Secondary | ICD-10-CM | POA: Diagnosis not present

## 2022-10-05 DIAGNOSIS — K200 Eosinophilic esophagitis: Secondary | ICD-10-CM | POA: Diagnosis not present

## 2022-10-06 DIAGNOSIS — F418 Other specified anxiety disorders: Secondary | ICD-10-CM | POA: Diagnosis not present

## 2022-10-11 DIAGNOSIS — L03316 Cellulitis of umbilicus: Secondary | ICD-10-CM | POA: Diagnosis not present

## 2022-10-21 DIAGNOSIS — F418 Other specified anxiety disorders: Secondary | ICD-10-CM | POA: Diagnosis not present

## 2022-10-24 DIAGNOSIS — K200 Eosinophilic esophagitis: Secondary | ICD-10-CM | POA: Diagnosis not present

## 2022-11-04 DIAGNOSIS — F418 Other specified anxiety disorders: Secondary | ICD-10-CM | POA: Diagnosis not present

## 2022-12-01 DIAGNOSIS — F418 Other specified anxiety disorders: Secondary | ICD-10-CM | POA: Diagnosis not present

## 2023-01-02 ENCOUNTER — Encounter: Payer: Self-pay | Admitting: Podiatry

## 2023-01-02 ENCOUNTER — Ambulatory Visit: Payer: Medicare PPO | Admitting: Podiatry

## 2023-01-02 DIAGNOSIS — L603 Nail dystrophy: Secondary | ICD-10-CM | POA: Diagnosis not present

## 2023-01-02 NOTE — Patient Instructions (Signed)

## 2023-01-02 NOTE — Progress Notes (Signed)
She presents today for her painful hallux left.  We removed the nail back in 2022 she says now the nail is thick and ugly and she says it seems to have another nail growing under it.  Objective: Vital signs are stable alert and oriented x 3 the toenail appears to be totally dystrophic margins of the nail never grew back but the nail does demonstrate severe distal onycholysis with pumping up of the nail and Planter curvature of the nail.  The proximal nail plate does not demonstrate cuticle and does not demonstrate any infection.  Assessment: Nail dystrophy hallux left.  Plan: Total nail avulsion at this point chemical matrixectomy was not performed.  She was given both oral and written home-going structure for the care and soaking of the toe and I will follow-up with her in a couple of weeks to make sure she is healing well.

## 2023-01-19 ENCOUNTER — Ambulatory Visit: Payer: Medicare PPO | Admitting: Podiatry

## 2023-01-19 ENCOUNTER — Encounter: Payer: Self-pay | Admitting: Podiatry

## 2023-01-19 DIAGNOSIS — L603 Nail dystrophy: Secondary | ICD-10-CM

## 2023-01-19 DIAGNOSIS — Z9889 Other specified postprocedural states: Secondary | ICD-10-CM

## 2023-01-19 DIAGNOSIS — L600 Ingrowing nail: Secondary | ICD-10-CM

## 2023-01-19 NOTE — Progress Notes (Signed)
Presents today for follow-up of her nail avulsion hallux left she states that it seems to be healing good though the scab has moved from the side to the other side.  She wants to know if there is any problem with this.  Objective: Vital signs are stable alert oriented x 3.  The chest is healing very nicely there is no erythema edema salines drainage or odor.  No signs of infection.  Small scab is noted on the lateral side of the proximal nailbed area.  Assessment: Well-healing nail avulsion.  Plan: Follow-up with her as needed.  Recommended as the new nail grows out to continue to keep the nailbed softened.

## 2023-02-09 DIAGNOSIS — Z5181 Encounter for therapeutic drug level monitoring: Secondary | ICD-10-CM | POA: Diagnosis not present

## 2023-02-16 DIAGNOSIS — F413 Other mixed anxiety disorders: Secondary | ICD-10-CM | POA: Diagnosis not present

## 2023-02-17 ENCOUNTER — Other Ambulatory Visit: Payer: Self-pay | Admitting: Obstetrics and Gynecology

## 2023-02-17 DIAGNOSIS — Z1231 Encounter for screening mammogram for malignant neoplasm of breast: Secondary | ICD-10-CM

## 2023-03-01 DIAGNOSIS — M65332 Trigger finger, left middle finger: Secondary | ICD-10-CM | POA: Diagnosis not present

## 2023-03-01 DIAGNOSIS — M79642 Pain in left hand: Secondary | ICD-10-CM | POA: Diagnosis not present

## 2023-03-01 DIAGNOSIS — M13842 Other specified arthritis, left hand: Secondary | ICD-10-CM | POA: Diagnosis not present

## 2023-03-01 DIAGNOSIS — M720 Palmar fascial fibromatosis [Dupuytren]: Secondary | ICD-10-CM | POA: Diagnosis not present

## 2023-03-01 DIAGNOSIS — M13841 Other specified arthritis, right hand: Secondary | ICD-10-CM | POA: Diagnosis not present

## 2023-03-03 DIAGNOSIS — M810 Age-related osteoporosis without current pathological fracture: Secondary | ICD-10-CM | POA: Diagnosis not present

## 2023-03-16 DIAGNOSIS — F413 Other mixed anxiety disorders: Secondary | ICD-10-CM | POA: Diagnosis not present

## 2023-03-29 DIAGNOSIS — N939 Abnormal uterine and vaginal bleeding, unspecified: Secondary | ICD-10-CM | POA: Diagnosis not present

## 2023-03-29 DIAGNOSIS — N950 Postmenopausal bleeding: Secondary | ICD-10-CM | POA: Diagnosis not present

## 2023-04-04 ENCOUNTER — Ambulatory Visit
Admission: RE | Admit: 2023-04-04 | Discharge: 2023-04-04 | Disposition: A | Discharge status: Home / Self Care | Payer: Medicare PPO | Source: Ambulatory Visit | Attending: Obstetrics and Gynecology | Admitting: Obstetrics and Gynecology

## 2023-04-04 DIAGNOSIS — Z1231 Encounter for screening mammogram for malignant neoplasm of breast: Secondary | ICD-10-CM

## 2023-04-13 DIAGNOSIS — F413 Other mixed anxiety disorders: Secondary | ICD-10-CM | POA: Diagnosis not present

## 2023-05-04 DIAGNOSIS — F413 Other mixed anxiety disorders: Secondary | ICD-10-CM | POA: Diagnosis not present

## 2023-05-16 DIAGNOSIS — M542 Cervicalgia: Secondary | ICD-10-CM | POA: Diagnosis not present

## 2023-05-17 DIAGNOSIS — E7800 Pure hypercholesterolemia, unspecified: Secondary | ICD-10-CM | POA: Diagnosis not present

## 2023-05-18 ENCOUNTER — Other Ambulatory Visit (HOSPITAL_COMMUNITY): Payer: Self-pay | Admitting: Internal Medicine

## 2023-05-18 DIAGNOSIS — E7800 Pure hypercholesterolemia, unspecified: Secondary | ICD-10-CM | POA: Diagnosis not present

## 2023-05-18 DIAGNOSIS — M810 Age-related osteoporosis without current pathological fracture: Secondary | ICD-10-CM | POA: Diagnosis not present

## 2023-05-18 DIAGNOSIS — E7841 Elevated Lipoprotein(a): Secondary | ICD-10-CM | POA: Diagnosis not present

## 2023-05-26 DIAGNOSIS — F413 Other mixed anxiety disorders: Secondary | ICD-10-CM | POA: Diagnosis not present

## 2023-05-31 ENCOUNTER — Ambulatory Visit (HOSPITAL_COMMUNITY): Payer: Medicare PPO

## 2023-06-01 ENCOUNTER — Ambulatory Visit (HOSPITAL_COMMUNITY)
Admission: RE | Admit: 2023-06-01 | Discharge: 2023-06-01 | Disposition: A | Discharge status: Home / Self Care | Payer: Medicare PPO | Source: Ambulatory Visit | Attending: Internal Medicine | Admitting: Internal Medicine

## 2023-06-01 DIAGNOSIS — E7800 Pure hypercholesterolemia, unspecified: Secondary | ICD-10-CM | POA: Insufficient documentation

## 2023-06-06 DIAGNOSIS — M542 Cervicalgia: Secondary | ICD-10-CM | POA: Diagnosis not present

## 2023-06-19 DIAGNOSIS — F413 Other mixed anxiety disorders: Secondary | ICD-10-CM | POA: Diagnosis not present

## 2023-06-20 DIAGNOSIS — H2513 Age-related nuclear cataract, bilateral: Secondary | ICD-10-CM | POA: Diagnosis not present

## 2023-06-20 DIAGNOSIS — M542 Cervicalgia: Secondary | ICD-10-CM | POA: Diagnosis not present

## 2023-06-20 DIAGNOSIS — H16223 Keratoconjunctivitis sicca, not specified as Sjogren's, bilateral: Secondary | ICD-10-CM | POA: Diagnosis not present

## 2023-06-29 DIAGNOSIS — H902 Conductive hearing loss, unspecified: Secondary | ICD-10-CM | POA: Diagnosis not present

## 2023-06-29 DIAGNOSIS — H6123 Impacted cerumen, bilateral: Secondary | ICD-10-CM | POA: Diagnosis not present

## 2023-07-10 DIAGNOSIS — F413 Other mixed anxiety disorders: Secondary | ICD-10-CM | POA: Diagnosis not present

## 2023-07-18 DIAGNOSIS — L111 Transient acantholytic dermatosis [Grover]: Secondary | ICD-10-CM | POA: Diagnosis not present

## 2023-07-18 DIAGNOSIS — L821 Other seborrheic keratosis: Secondary | ICD-10-CM | POA: Diagnosis not present

## 2023-07-18 DIAGNOSIS — D225 Melanocytic nevi of trunk: Secondary | ICD-10-CM | POA: Diagnosis not present

## 2023-07-18 DIAGNOSIS — L720 Epidermal cyst: Secondary | ICD-10-CM | POA: Diagnosis not present

## 2023-07-18 DIAGNOSIS — L218 Other seborrheic dermatitis: Secondary | ICD-10-CM | POA: Diagnosis not present

## 2023-07-18 DIAGNOSIS — L814 Other melanin hyperpigmentation: Secondary | ICD-10-CM | POA: Diagnosis not present

## 2023-07-18 DIAGNOSIS — L298 Other pruritus: Secondary | ICD-10-CM | POA: Diagnosis not present

## 2023-07-20 ENCOUNTER — Encounter: Payer: Self-pay | Admitting: Podiatry

## 2023-07-28 DIAGNOSIS — F413 Other mixed anxiety disorders: Secondary | ICD-10-CM | POA: Diagnosis not present

## 2023-08-02 DIAGNOSIS — E7800 Pure hypercholesterolemia, unspecified: Secondary | ICD-10-CM | POA: Diagnosis not present

## 2023-08-03 DIAGNOSIS — F413 Other mixed anxiety disorders: Secondary | ICD-10-CM | POA: Diagnosis not present

## 2023-08-15 DIAGNOSIS — E7800 Pure hypercholesterolemia, unspecified: Secondary | ICD-10-CM | POA: Diagnosis not present

## 2023-08-15 DIAGNOSIS — Z0000 Encounter for general adult medical examination without abnormal findings: Secondary | ICD-10-CM | POA: Diagnosis not present

## 2023-08-15 DIAGNOSIS — M810 Age-related osteoporosis without current pathological fracture: Secondary | ICD-10-CM | POA: Diagnosis not present

## 2023-08-15 DIAGNOSIS — K200 Eosinophilic esophagitis: Secondary | ICD-10-CM | POA: Diagnosis not present

## 2023-08-15 DIAGNOSIS — K219 Gastro-esophageal reflux disease without esophagitis: Secondary | ICD-10-CM | POA: Diagnosis not present

## 2023-08-15 DIAGNOSIS — Z1331 Encounter for screening for depression: Secondary | ICD-10-CM | POA: Diagnosis not present

## 2023-08-15 DIAGNOSIS — F329 Major depressive disorder, single episode, unspecified: Secondary | ICD-10-CM | POA: Diagnosis not present

## 2023-08-15 DIAGNOSIS — E7841 Elevated Lipoprotein(a): Secondary | ICD-10-CM | POA: Diagnosis not present

## 2023-08-15 DIAGNOSIS — G8929 Other chronic pain: Secondary | ICD-10-CM | POA: Diagnosis not present

## 2023-08-15 DIAGNOSIS — M545 Low back pain, unspecified: Secondary | ICD-10-CM | POA: Diagnosis not present

## 2023-08-15 DIAGNOSIS — F5101 Primary insomnia: Secondary | ICD-10-CM | POA: Diagnosis not present

## 2023-08-17 DIAGNOSIS — F413 Other mixed anxiety disorders: Secondary | ICD-10-CM | POA: Diagnosis not present

## 2023-08-21 DIAGNOSIS — F413 Other mixed anxiety disorders: Secondary | ICD-10-CM | POA: Diagnosis not present

## 2023-08-30 ENCOUNTER — Other Ambulatory Visit: Payer: Self-pay | Admitting: Internal Medicine

## 2023-08-30 ENCOUNTER — Ambulatory Visit
Admission: RE | Admit: 2023-08-30 | Discharge: 2023-08-30 | Disposition: A | Discharge status: Home / Self Care | Payer: Medicare PPO | Source: Ambulatory Visit | Attending: Internal Medicine | Admitting: Internal Medicine

## 2023-08-30 DIAGNOSIS — M545 Low back pain, unspecified: Secondary | ICD-10-CM | POA: Diagnosis not present

## 2023-08-30 DIAGNOSIS — M25551 Pain in right hip: Secondary | ICD-10-CM | POA: Diagnosis not present

## 2023-09-14 DIAGNOSIS — F413 Other mixed anxiety disorders: Secondary | ICD-10-CM | POA: Diagnosis not present

## 2023-09-28 DIAGNOSIS — F413 Other mixed anxiety disorders: Secondary | ICD-10-CM | POA: Diagnosis not present

## 2023-10-13 DIAGNOSIS — F413 Other mixed anxiety disorders: Secondary | ICD-10-CM | POA: Diagnosis not present

## 2023-10-27 DIAGNOSIS — F413 Other mixed anxiety disorders: Secondary | ICD-10-CM | POA: Diagnosis not present

## 2023-11-15 DIAGNOSIS — R159 Full incontinence of feces: Secondary | ICD-10-CM | POA: Diagnosis not present

## 2023-11-20 DIAGNOSIS — K200 Eosinophilic esophagitis: Secondary | ICD-10-CM | POA: Diagnosis not present

## 2023-11-20 DIAGNOSIS — R151 Fecal smearing: Secondary | ICD-10-CM | POA: Diagnosis not present

## 2023-11-23 DIAGNOSIS — F413 Other mixed anxiety disorders: Secondary | ICD-10-CM | POA: Diagnosis not present

## 2023-11-27 DIAGNOSIS — K200 Eosinophilic esophagitis: Secondary | ICD-10-CM | POA: Diagnosis not present

## 2023-11-27 DIAGNOSIS — R151 Fecal smearing: Secondary | ICD-10-CM | POA: Diagnosis not present

## 2023-12-04 DIAGNOSIS — F413 Other mixed anxiety disorders: Secondary | ICD-10-CM | POA: Diagnosis not present

## 2023-12-22 DIAGNOSIS — F413 Other mixed anxiety disorders: Secondary | ICD-10-CM | POA: Diagnosis not present

## 2023-12-28 DIAGNOSIS — J340 Abscess, furuncle and carbuncle of nose: Secondary | ICD-10-CM | POA: Diagnosis not present

## 2023-12-28 DIAGNOSIS — H6123 Impacted cerumen, bilateral: Secondary | ICD-10-CM | POA: Diagnosis not present

## 2023-12-28 DIAGNOSIS — H60543 Acute eczematoid otitis externa, bilateral: Secondary | ICD-10-CM | POA: Diagnosis not present

## 2024-01-02 DIAGNOSIS — K648 Other hemorrhoids: Secondary | ICD-10-CM | POA: Diagnosis not present

## 2024-01-02 DIAGNOSIS — K2930 Chronic superficial gastritis without bleeding: Secondary | ICD-10-CM | POA: Diagnosis not present

## 2024-01-02 DIAGNOSIS — K31A15 Gastric intestinal metaplasia without dysplasia, involving multiple sites: Secondary | ICD-10-CM | POA: Diagnosis not present

## 2024-01-02 DIAGNOSIS — K31A19 Gastric intestinal metaplasia without dysplasia, unspecified site: Secondary | ICD-10-CM | POA: Diagnosis not present

## 2024-01-02 DIAGNOSIS — R1310 Dysphagia, unspecified: Secondary | ICD-10-CM | POA: Diagnosis not present

## 2024-01-02 DIAGNOSIS — K2940 Chronic atrophic gastritis without bleeding: Secondary | ICD-10-CM | POA: Diagnosis not present

## 2024-01-02 DIAGNOSIS — K3189 Other diseases of stomach and duodenum: Secondary | ICD-10-CM | POA: Diagnosis not present

## 2024-01-02 DIAGNOSIS — K2289 Other specified disease of esophagus: Secondary | ICD-10-CM | POA: Diagnosis not present

## 2024-01-02 DIAGNOSIS — K200 Eosinophilic esophagitis: Secondary | ICD-10-CM | POA: Diagnosis not present

## 2024-01-02 DIAGNOSIS — K5289 Other specified noninfective gastroenteritis and colitis: Secondary | ICD-10-CM | POA: Diagnosis not present

## 2024-01-04 DIAGNOSIS — F413 Other mixed anxiety disorders: Secondary | ICD-10-CM | POA: Diagnosis not present

## 2024-01-09 DIAGNOSIS — K2940 Chronic atrophic gastritis without bleeding: Secondary | ICD-10-CM | POA: Diagnosis not present

## 2024-01-09 DIAGNOSIS — K2289 Other specified disease of esophagus: Secondary | ICD-10-CM | POA: Diagnosis not present

## 2024-01-09 DIAGNOSIS — K5289 Other specified noninfective gastroenteritis and colitis: Secondary | ICD-10-CM | POA: Diagnosis not present

## 2024-01-09 DIAGNOSIS — K31A19 Gastric intestinal metaplasia without dysplasia, unspecified site: Secondary | ICD-10-CM | POA: Diagnosis not present

## 2024-02-05 DIAGNOSIS — F413 Other mixed anxiety disorders: Secondary | ICD-10-CM | POA: Diagnosis not present

## 2024-02-12 DIAGNOSIS — Z79899 Other long term (current) drug therapy: Secondary | ICD-10-CM | POA: Diagnosis not present

## 2024-02-12 DIAGNOSIS — M810 Age-related osteoporosis without current pathological fracture: Secondary | ICD-10-CM | POA: Diagnosis not present

## 2024-02-19 DIAGNOSIS — F413 Other mixed anxiety disorders: Secondary | ICD-10-CM | POA: Diagnosis not present

## 2024-03-11 DIAGNOSIS — F413 Other mixed anxiety disorders: Secondary | ICD-10-CM | POA: Diagnosis not present

## 2024-03-21 DIAGNOSIS — F413 Other mixed anxiety disorders: Secondary | ICD-10-CM | POA: Diagnosis not present

## 2024-03-28 DIAGNOSIS — M810 Age-related osteoporosis without current pathological fracture: Secondary | ICD-10-CM | POA: Diagnosis not present

## 2024-04-04 DIAGNOSIS — F413 Other mixed anxiety disorders: Secondary | ICD-10-CM | POA: Diagnosis not present

## 2024-04-18 DIAGNOSIS — F413 Other mixed anxiety disorders: Secondary | ICD-10-CM | POA: Diagnosis not present

## 2024-04-26 DIAGNOSIS — F413 Other mixed anxiety disorders: Secondary | ICD-10-CM | POA: Diagnosis not present

## 2024-05-07 ENCOUNTER — Other Ambulatory Visit: Payer: Self-pay | Admitting: Internal Medicine

## 2024-05-07 DIAGNOSIS — Z1231 Encounter for screening mammogram for malignant neoplasm of breast: Secondary | ICD-10-CM

## 2024-05-09 DIAGNOSIS — F413 Other mixed anxiety disorders: Secondary | ICD-10-CM | POA: Diagnosis not present

## 2024-05-16 ENCOUNTER — Ambulatory Visit

## 2024-05-21 ENCOUNTER — Ambulatory Visit
Admission: RE | Admit: 2024-05-21 | Discharge: 2024-05-21 | Disposition: A | Discharge status: Home / Self Care | Source: Ambulatory Visit | Attending: Internal Medicine | Admitting: Internal Medicine

## 2024-05-21 DIAGNOSIS — Z1231 Encounter for screening mammogram for malignant neoplasm of breast: Secondary | ICD-10-CM | POA: Diagnosis not present

## 2024-05-24 DIAGNOSIS — F413 Other mixed anxiety disorders: Secondary | ICD-10-CM | POA: Diagnosis not present

## 2024-06-14 DIAGNOSIS — F413 Other mixed anxiety disorders: Secondary | ICD-10-CM | POA: Diagnosis not present

## 2024-06-26 DIAGNOSIS — H2513 Age-related nuclear cataract, bilateral: Secondary | ICD-10-CM | POA: Diagnosis not present

## 2024-06-26 DIAGNOSIS — H16223 Keratoconjunctivitis sicca, not specified as Sjogren's, bilateral: Secondary | ICD-10-CM | POA: Diagnosis not present

## 2024-06-26 DIAGNOSIS — H04123 Dry eye syndrome of bilateral lacrimal glands: Secondary | ICD-10-CM | POA: Diagnosis not present

## 2024-06-27 DIAGNOSIS — H6123 Impacted cerumen, bilateral: Secondary | ICD-10-CM | POA: Diagnosis not present

## 2024-06-27 DIAGNOSIS — H902 Conductive hearing loss, unspecified: Secondary | ICD-10-CM | POA: Diagnosis not present

## 2024-07-05 DIAGNOSIS — F413 Other mixed anxiety disorders: Secondary | ICD-10-CM | POA: Diagnosis not present

## 2024-07-11 ENCOUNTER — Other Ambulatory Visit (HOSPITAL_BASED_OUTPATIENT_CLINIC_OR_DEPARTMENT_OTHER): Payer: Self-pay | Admitting: Internal Medicine

## 2024-07-11 DIAGNOSIS — M810 Age-related osteoporosis without current pathological fracture: Secondary | ICD-10-CM

## 2024-07-17 ENCOUNTER — Ambulatory Visit (HOSPITAL_BASED_OUTPATIENT_CLINIC_OR_DEPARTMENT_OTHER)
Admission: RE | Admit: 2024-07-17 | Discharge: 2024-07-17 | Disposition: A | Discharge status: Home / Self Care | Source: Ambulatory Visit | Attending: Internal Medicine | Admitting: Internal Medicine

## 2024-07-17 DIAGNOSIS — Z780 Asymptomatic menopausal state: Secondary | ICD-10-CM | POA: Diagnosis not present

## 2024-07-17 DIAGNOSIS — M810 Age-related osteoporosis without current pathological fracture: Secondary | ICD-10-CM | POA: Insufficient documentation

## 2024-07-18 DIAGNOSIS — F413 Other mixed anxiety disorders: Secondary | ICD-10-CM | POA: Diagnosis not present

## 2024-07-23 DIAGNOSIS — H02882 Meibomian gland dysfunction right lower eyelid: Secondary | ICD-10-CM | POA: Diagnosis not present

## 2024-07-23 DIAGNOSIS — H04123 Dry eye syndrome of bilateral lacrimal glands: Secondary | ICD-10-CM | POA: Diagnosis not present

## 2024-07-23 DIAGNOSIS — H02885 Meibomian gland dysfunction left lower eyelid: Secondary | ICD-10-CM | POA: Diagnosis not present

## 2024-07-30 DIAGNOSIS — M542 Cervicalgia: Secondary | ICD-10-CM | POA: Diagnosis not present

## 2024-07-30 DIAGNOSIS — M79642 Pain in left hand: Secondary | ICD-10-CM | POA: Diagnosis not present

## 2024-08-01 DIAGNOSIS — F413 Other mixed anxiety disorders: Secondary | ICD-10-CM | POA: Diagnosis not present

## 2024-08-12 DIAGNOSIS — E7849 Other hyperlipidemia: Secondary | ICD-10-CM | POA: Diagnosis not present

## 2024-08-12 DIAGNOSIS — Z79899 Other long term (current) drug therapy: Secondary | ICD-10-CM | POA: Diagnosis not present

## 2024-08-12 DIAGNOSIS — M810 Age-related osteoporosis without current pathological fracture: Secondary | ICD-10-CM | POA: Diagnosis not present

## 2024-08-15 DIAGNOSIS — F413 Other mixed anxiety disorders: Secondary | ICD-10-CM | POA: Diagnosis not present

## 2024-08-19 DIAGNOSIS — N182 Chronic kidney disease, stage 2 (mild): Secondary | ICD-10-CM | POA: Diagnosis not present

## 2024-08-19 DIAGNOSIS — K219 Gastro-esophageal reflux disease without esophagitis: Secondary | ICD-10-CM | POA: Diagnosis not present

## 2024-08-19 DIAGNOSIS — F419 Anxiety disorder, unspecified: Secondary | ICD-10-CM | POA: Diagnosis not present

## 2024-08-19 DIAGNOSIS — F5101 Primary insomnia: Secondary | ICD-10-CM | POA: Diagnosis not present

## 2024-08-19 DIAGNOSIS — K200 Eosinophilic esophagitis: Secondary | ICD-10-CM | POA: Diagnosis not present

## 2024-08-19 DIAGNOSIS — E7800 Pure hypercholesterolemia, unspecified: Secondary | ICD-10-CM | POA: Diagnosis not present

## 2024-08-19 DIAGNOSIS — Z0000 Encounter for general adult medical examination without abnormal findings: Secondary | ICD-10-CM | POA: Diagnosis not present

## 2024-08-19 DIAGNOSIS — F322 Major depressive disorder, single episode, severe without psychotic features: Secondary | ICD-10-CM | POA: Diagnosis not present

## 2024-08-19 DIAGNOSIS — Z1331 Encounter for screening for depression: Secondary | ICD-10-CM | POA: Diagnosis not present

## 2024-08-19 DIAGNOSIS — M810 Age-related osteoporosis without current pathological fracture: Secondary | ICD-10-CM | POA: Diagnosis not present

## 2024-08-22 DIAGNOSIS — F413 Other mixed anxiety disorders: Secondary | ICD-10-CM | POA: Diagnosis not present

## 2024-08-29 DIAGNOSIS — F413 Other mixed anxiety disorders: Secondary | ICD-10-CM | POA: Diagnosis not present

## 2024-08-30 DIAGNOSIS — R159 Full incontinence of feces: Secondary | ICD-10-CM | POA: Diagnosis not present

## 2024-08-30 DIAGNOSIS — K200 Eosinophilic esophagitis: Secondary | ICD-10-CM | POA: Diagnosis not present

## 2024-09-06 DIAGNOSIS — F413 Other mixed anxiety disorders: Secondary | ICD-10-CM | POA: Diagnosis not present

## 2024-09-12 DIAGNOSIS — N182 Chronic kidney disease, stage 2 (mild): Secondary | ICD-10-CM | POA: Diagnosis not present

## 2024-09-20 DIAGNOSIS — F413 Other mixed anxiety disorders: Secondary | ICD-10-CM | POA: Diagnosis not present

## 2024-10-04 DIAGNOSIS — F413 Other mixed anxiety disorders: Secondary | ICD-10-CM | POA: Diagnosis not present
